# Patient Record
Sex: Female | Born: 1950 | Race: White | Hispanic: No | Marital: Married | Smoking: Never smoker
Health system: Southern US, Community
[De-identification: ages and names within clinical notes are randomized; demographics above are authoritative.]

## PROBLEM LIST (undated history)

## (undated) DIAGNOSIS — Z87442 Personal history of urinary calculi: Secondary | ICD-10-CM

## (undated) DIAGNOSIS — I712 Thoracic aortic aneurysm, without rupture, unspecified: Secondary | ICD-10-CM

## (undated) DIAGNOSIS — Z9889 Other specified postprocedural states: Secondary | ICD-10-CM

## (undated) DIAGNOSIS — C189 Malignant neoplasm of colon, unspecified: Secondary | ICD-10-CM

## (undated) DIAGNOSIS — Z9289 Personal history of other medical treatment: Secondary | ICD-10-CM

## (undated) DIAGNOSIS — I2510 Atherosclerotic heart disease of native coronary artery without angina pectoris: Secondary | ICD-10-CM

## (undated) DIAGNOSIS — R112 Nausea with vomiting, unspecified: Secondary | ICD-10-CM

## (undated) DIAGNOSIS — R011 Cardiac murmur, unspecified: Secondary | ICD-10-CM

## (undated) DIAGNOSIS — M1990 Unspecified osteoarthritis, unspecified site: Secondary | ICD-10-CM

## (undated) DIAGNOSIS — Q231 Congenital insufficiency of aortic valve: Secondary | ICD-10-CM

## (undated) DIAGNOSIS — C7A019 Malignant carcinoid tumor of the small intestine, unspecified portion: Secondary | ICD-10-CM

## (undated) DIAGNOSIS — I719 Aortic aneurysm of unspecified site, without rupture: Secondary | ICD-10-CM

## (undated) DIAGNOSIS — Q2381 Bicuspid aortic valve: Secondary | ICD-10-CM

## (undated) DIAGNOSIS — D509 Iron deficiency anemia, unspecified: Secondary | ICD-10-CM

## (undated) DIAGNOSIS — N200 Calculus of kidney: Secondary | ICD-10-CM

## (undated) DIAGNOSIS — I340 Nonrheumatic mitral (valve) insufficiency: Secondary | ICD-10-CM

## (undated) DIAGNOSIS — I10 Essential (primary) hypertension: Secondary | ICD-10-CM

## (undated) HISTORY — DX: Nonrheumatic mitral (valve) insufficiency: I34.0

## (undated) HISTORY — DX: Personal history of other medical treatment: Z92.89

## (undated) HISTORY — DX: Atherosclerotic heart disease of native coronary artery without angina pectoris: I25.10

## (undated) HISTORY — DX: Thoracic aortic aneurysm, without rupture, unspecified: I71.20

## (undated) HISTORY — DX: Essential (primary) hypertension: I10

## (undated) HISTORY — DX: Congenital insufficiency of aortic valve: Q23.1

## (undated) HISTORY — PX: COLON SURGERY: SHX602

## (undated) HISTORY — DX: Bicuspid aortic valve: Q23.81

## (undated) HISTORY — PX: CATARACT EXTRACTION W/ INTRAOCULAR LENS IMPLANT: SHX1309

## (undated) HISTORY — PX: ABDOMINAL HYSTERECTOMY: SHX81

## (undated) HISTORY — PX: TONSILLECTOMY: SUR1361

## (undated) HISTORY — DX: Thoracic aortic aneurysm, without rupture: I71.2

---

## 1997-09-18 ENCOUNTER — Other Ambulatory Visit: Admission: RE | Admit: 1997-09-18 | Discharge: 1997-09-18 | Payer: Self-pay | Admitting: *Deleted

## 1999-11-27 ENCOUNTER — Other Ambulatory Visit: Admission: RE | Admit: 1999-11-27 | Discharge: 1999-11-27 | Payer: Self-pay | Admitting: *Deleted

## 2003-02-22 ENCOUNTER — Other Ambulatory Visit: Admission: RE | Admit: 2003-02-22 | Discharge: 2003-02-22 | Payer: Self-pay | Admitting: *Deleted

## 2003-08-23 ENCOUNTER — Encounter: Admission: RE | Admit: 2003-08-23 | Discharge: 2003-08-23 | Payer: Self-pay | Admitting: Family Medicine

## 2004-04-01 ENCOUNTER — Encounter: Admission: RE | Admit: 2004-04-01 | Discharge: 2004-04-01 | Payer: Self-pay | Admitting: Family Medicine

## 2004-04-25 ENCOUNTER — Ambulatory Visit (HOSPITAL_COMMUNITY): Admission: RE | Admit: 2004-04-25 | Discharge: 2004-04-25 | Payer: Self-pay | Admitting: Gastroenterology

## 2004-04-25 ENCOUNTER — Encounter (INDEPENDENT_AMBULATORY_CARE_PROVIDER_SITE_OTHER): Payer: Self-pay | Admitting: Specialist

## 2004-06-13 ENCOUNTER — Encounter: Admission: RE | Admit: 2004-06-13 | Discharge: 2004-06-13 | Payer: Self-pay | Admitting: Family Medicine

## 2005-04-22 ENCOUNTER — Encounter: Admission: RE | Admit: 2005-04-22 | Discharge: 2005-07-21 | Payer: Self-pay | Admitting: Family Medicine

## 2005-05-05 ENCOUNTER — Encounter: Admission: RE | Admit: 2005-05-05 | Discharge: 2005-05-05 | Payer: Self-pay | Admitting: *Deleted

## 2005-10-22 ENCOUNTER — Encounter: Admission: RE | Admit: 2005-10-22 | Discharge: 2005-10-22 | Payer: Self-pay | Admitting: *Deleted

## 2006-06-15 ENCOUNTER — Encounter: Admission: RE | Admit: 2006-06-15 | Discharge: 2006-06-15 | Payer: Self-pay | Admitting: Family Medicine

## 2006-11-03 ENCOUNTER — Encounter: Admission: RE | Admit: 2006-11-03 | Discharge: 2006-11-03 | Payer: Self-pay | Admitting: Family Medicine

## 2007-04-05 ENCOUNTER — Encounter: Admission: RE | Admit: 2007-04-05 | Discharge: 2007-04-05 | Payer: Self-pay | Admitting: Cardiology

## 2007-04-07 ENCOUNTER — Ambulatory Visit (HOSPITAL_COMMUNITY): Admission: RE | Admit: 2007-04-07 | Discharge: 2007-04-07 | Payer: Self-pay | Admitting: Cardiology

## 2007-04-11 ENCOUNTER — Encounter: Admission: RE | Admit: 2007-04-11 | Discharge: 2007-04-11 | Payer: Self-pay | Admitting: Cardiology

## 2007-05-06 ENCOUNTER — Encounter: Admission: RE | Admit: 2007-05-06 | Discharge: 2007-05-06 | Payer: Self-pay | Admitting: Gastroenterology

## 2007-05-13 ENCOUNTER — Ambulatory Visit: Payer: Self-pay | Admitting: Cardiothoracic Surgery

## 2007-08-25 ENCOUNTER — Other Ambulatory Visit: Admission: RE | Admit: 2007-08-25 | Discharge: 2007-08-25 | Payer: Self-pay | Admitting: Family Medicine

## 2007-11-11 ENCOUNTER — Encounter: Admission: RE | Admit: 2007-11-11 | Discharge: 2007-11-11 | Payer: Self-pay | Admitting: Family Medicine

## 2007-12-02 ENCOUNTER — Encounter: Admission: RE | Admit: 2007-12-02 | Discharge: 2007-12-02 | Payer: Self-pay | Admitting: Cardiothoracic Surgery

## 2007-12-02 ENCOUNTER — Ambulatory Visit: Payer: Self-pay | Admitting: Cardiothoracic Surgery

## 2008-11-13 ENCOUNTER — Encounter: Admission: RE | Admit: 2008-11-13 | Discharge: 2008-11-13 | Payer: Self-pay | Admitting: Family Medicine

## 2008-11-28 ENCOUNTER — Encounter: Admission: RE | Admit: 2008-11-28 | Discharge: 2008-11-28 | Payer: Self-pay | Admitting: Cardiothoracic Surgery

## 2008-11-30 ENCOUNTER — Ambulatory Visit: Payer: Self-pay | Admitting: Cardiothoracic Surgery

## 2009-11-26 ENCOUNTER — Encounter: Admission: RE | Admit: 2009-11-26 | Discharge: 2009-11-26 | Payer: Self-pay | Admitting: Family Medicine

## 2010-04-30 ENCOUNTER — Other Ambulatory Visit: Payer: Self-pay | Admitting: Cardiothoracic Surgery

## 2010-04-30 DIAGNOSIS — I712 Thoracic aortic aneurysm, without rupture: Secondary | ICD-10-CM

## 2010-06-04 ENCOUNTER — Ambulatory Visit
Admission: RE | Admit: 2010-06-04 | Discharge: 2010-06-04 | Disposition: A | Discharge status: Home / Self Care | Payer: BC Managed Care – PPO | Source: Ambulatory Visit | Attending: Cardiothoracic Surgery | Admitting: Cardiothoracic Surgery

## 2010-06-04 ENCOUNTER — Ambulatory Visit (INDEPENDENT_AMBULATORY_CARE_PROVIDER_SITE_OTHER): Payer: BC Managed Care – PPO | Admitting: Cardiothoracic Surgery

## 2010-06-04 DIAGNOSIS — I712 Thoracic aortic aneurysm, without rupture: Secondary | ICD-10-CM

## 2010-06-04 MED ORDER — GADOBENATE DIMEGLUMINE 529 MG/ML IV SOLN
15.0000 mL | Freq: Once | INTRAVENOUS | Status: AC | PRN
  Administered 2010-06-04: 15 mL via INTRAVENOUS

## 2010-06-05 NOTE — Assessment & Plan Note (Signed)
OFFICE VISIT  Debra, [name] DOB:  April 30, 1950                                        June 04, 2010 CHART #:  [id number]  PROBLEMS: 1. 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 2. Known bicuspid aortic valve by previous echo, done by Dr. Mayford Knife,     without significant aortic stenosis or insufficiency. 3. Mild coronary artery disease by cath in 2009, with history of     atypical chest pain. 4. Hypertension.  HOME MEDICATIONS: 1. Toprol-XL 50 mg once a day. 2. Aspirin 325 mg a day. 3. Simvastatin 20 mg a day.  PRESENT ILLNESS:  The patient is a 60 year old female who returns for further followup of her mild fusiform ascending aneurysm with an MRA of the thoracic aorta.  She has been asymptomatic.  She is still working full-time and exercises with elliptical 3-4 times a week.  She has had no cardiac events or hospital visit since her last review 18 months ago. Her overall health is excellent.  PHYSICAL EXAMINATION:  VITAL SIGNS:  Blood pressure 135/80, pulse 76, saturation on room air 99%. GENERAL:  She is alert and pleasant. CHEST:  Breath sounds are clear. CARDIAC:  Rhythm is regular with a soft 1-2/6 systolic murmur.  There is no diastolic murmur.  Peripheral pulses are all intact.  There is no pedal edema.  LABORATORY DATA:  Her MRA of the ascending aorta shows fusiform aneurysm be unchanged.  By measurements today on MRA rather than CT scan, the ascending aorta diameter is 3.8 cm and descending thoracic aorta is 2.0 cm.  There is no evidence of penetrating ulcer, dissection, or significant calcification.  IMPRESSION AND PLAN:  Stable mild fusiform ascending aortic aneurysm. Continue current medications with blood pressure control but no limitation on activity.  We will plan on seeing her back in 18 months with a followup MRA.  [name], M.D. Electronically Signed  PV/MEDQ  D:   06/04/2010  T:  06/05/2010  Job:  [id number]  cc:   Armanda Magic, M.D. Dario Guardian, M.D.

## 2010-07-22 NOTE — Cardiovascular Report (Signed)
NAMEMARKERIA, GOETSCH           ACCOUNT NO.:  [id number]   MEDICAL RECORD NO.:  [id number]          PATIENT TYPE:  OIB   LOCATION:  2899                         FACILITY:  [facility]   PHYSICIAN:  Armanda Magic, M.D.     DATE OF BIRTH:  [birth year]   DATE OF PROCEDURE:  04/07/2007  DATE OF DISCHARGE:                            CARDIAC CATHETERIZATION   PROCEDURES:  1. Left heart catheterization.  2. Coronary angiography.  3. Left ventriculography.  4. Aortography   OPERATOR:  Armanda Magic, MD   INDICATIONS:  Chest pain.   COMPLICATIONS:  None.   IV MEDICATIONS:  None.   IV ACCESS:  Via right femoral artery 6-French sheath.   This a 60 year old female with a history of ongoing chest pain with no  inducible ischemia by Cardiolite but because of ongoing persistent chest  pain, she now presents first cardiac catheterization.  The patient does  have a history of a bicuspid aortic valve with trivial AI and mildly  dilated aortic root by echo in the past.   The patient was brought to the cardiac catheterization laboratory in a  fasting, nonsedated state.  Informed consent was obtained.  The patient  was connected to continuous heart rate and pulse oximetry monitoring and  intermittent blood pressure monitoring.  The right groin was prepped and  draped in a sterile fashion.  Xylocaine 1% was used for local  anesthesia.  Using modified Seldinger technique, a 6-French sheath was  placed in the right femoral artery.  Under fluoroscopic guidance a 6-  [place] JL-4 catheter was placed in the left coronary artery.  Multiple  cine films were taken in 30-degree RAO and 40-degree LAO views.  The  catheter kept popping out of the ostium so it was exchanged out over a  guidewire for a 6-French JL 3.5 catheter, but this could not  successfully engage the coronary ostium and was exchanged back out for a  6-French JL-4 catheter.  Again multiple cine films were taken in 30-  degree RAO views.  The  catheter was exchanged out over a guidewire for a  6-French JR-4 catheter, which was placed under fluoroscopic guidance in  the right coronary artery.  Multiple cine films taken in 30-degree RAO  and 40-degree LAO views.  This catheter was exchanged out over a  guidewire for a 6-French angled pigtail catheter, which was placed under  fluoroscopic guidance into the left ventricular cavity.  Left  ventriculography was performed in the 30-degree RAO view using a total  of 30 mL of contrast at 15 mL per second.  The catheter was then pulled  back across the aortic valve with no significant gradient noted.  Because of the appearance of a dilated aortic root on the left  ventriculogram, a proximal aortography was performed using a total of 20  mL of contrast at 12 mL per second.  The catheter was then removed over  a guidewire.  At the end of the procedure all catheters and sheaths were  removed.  Manual compression was performed until adequate hemostasis was  obtained.  The patient was transferred back to her room  in stable  condition.   RESULTS:  1. The left main coronary is widely patent and bifurcates into a left      anterior descending artery and left circumflex artery.  2. The left anterior descending artery has an ostial 40-50% narrowing      but then is widely patent throughout its course to the apex.  It      gives rise to two diagonal branches, both of which are widely      patent.  3. The left circumflex is widely patent throughout its course giving      rise to four obtuse marginal branches, which are rather large and      widely patent.  4. The right coronary artery is widely patent throughout its course      across the inferior wall.  It bifurcates into a posterior      descending artery and posterolateral artery, both of which are      widely patent   Left ventriculography showed normal LV function, EF 60%, left  ventricular pressure was 137/-12 mmHg.  Aortic pressure  157/57 mmHg.  LVEDP was 11 mmHg.   Aortography showed an aortic root aneurysm of approximately 4.5 cm.   ASSESSMENT:  1. Nonobstructive coronary disease.  2. Normal left ventricular function.  3. Hypertension.  4. Dilated aortic root.   PLAN:  Discharge to home after IV fluid and bedrest.  Aspirin 325 mg  daily.  Increase Toprol XL to 50 mg a day for better blood pressure  control and follow up with me in 1 week.  I am going to get an  outpatient CT scan of her chest to get a better measurement of the  widest diameter of the aneurysm.      Armanda Magic, M.D.  Electronically Signed     TT/MEDQ  D:  04/07/2007  T:  04/08/2007  Job:  [id number]   cc:   Bryan Lemma. Manus Gunning, M.D.

## 2010-07-22 NOTE — Assessment & Plan Note (Signed)
OFFICE VISIT   Debra [name], Debra [name]  DOB:  08-22-1950                                        November 30, 2008  CHART #:  [id number]   CURRENT PROBLEMS:  1. 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  2. Bicuspid aortic valve with minimal aortic stenosis - aortic      insufficiency by most recent echo in 2009, followed by Dr. Mayford Knife.  3. Mild coronary disease with history of atypical chest pain.  4. Hypertension.   CURRENT MEDICATIONS:  1. Toprol-XL 50 mg daily.  2. Aspirin 325 mg daily.  3. Simvastatin 20 mg daily.   PRESENT ILLNESS:  The patient is a 60 year old nonsmoker who has been  followed with serial scans for a mild-to-moderate fusiform aneurysm of  the ascending aorta.  This was first noted by cardiac cath in January  2009.  I have followed up with 2 CT angiograms prior to this visit.  The  ascending aorta has remained stable, and there has been no evidence of  penetrating ulcer or dissection.  Her aortic valve is bicuspid and has  mild AS/AI and good LV function.  Her coronary arteriography in January  2009, demonstrated a 40% stenosis of the LAD.  She has had atypical  chest pain, felt to be probably GI in origin.  She continues to be very  active and denies any significant chest pain.  There is no palpitation  or history of arrhythmia.  Her father had a bicuspid valve and required  an AVR in his 12s.   PHYSICAL EXAMINATION:  Vital Signs:  Blood pressure 130/80, pulse 75 and  regular, respirations 18, and saturation 99% on room air.  General:  She  is alert and oriented and comfortable.  Lungs:  Clear and equal.  Cardiac:  A regular rhythm with a stable grade 2/6 systolic ejection  murmur and no diastolic rumble.  There is no gallop.  Extremities:  Palpable pulses without edema.   LABORATORY DATA:  An MRI-MRA was performed today in order to reduce the  exposure to radiation with serial imaging studies  of her thoracic aorta.  This demonstrates a stable fusiform ascending aneurysm which measured by  MRA approximately 3.8 cm, slightly smaller, but with a different  technique than the previous scan which is a CTA.   IMPRESSION AND PLAN:  The patient's thoracic aortic disease is mild and  stable.  We spent several minutes discussing her activity levels, and  she does not have any at-risk activities such as heavy lifting or  smoking and her medical risk factors are well controlled with respect to  blood pressure and cholesterol control and weight control.   I told the patient I felt that she was at low risk for dissection and  that we should still continue to follow her thoracic aorta with serial  scans, but we will convert to MRI-MRA studies to reduce cumulative  exposure to radiation as she is very comfortable with the scan.  She  will return in 18 months with a followup study.  She understands that if  the aortic valve disease becomes more significant with an aortic valve  replacement, we would also replace the ascending aorta at that time.   [name], M.D.  Electronically Signed  PV/MEDQ  D:  11/30/2008  T:  12/01/2008  Job:  [id number]   cc:   Armanda Magic, M.D.  Dario Guardian, M.D.

## 2010-07-22 NOTE — Consult Note (Signed)
NEW PATIENT CONSULTATION   Debra Debra [name], Debra Debra [name]  DOB:  May 28, 1950                                        May 13, 2007  CHART #:  [id number]   REASON FOR CONSULTATION:  1. Fusiform diltation of the ascending aorta 4.4 cm.  2. Bicuspid aortic valve with minimal AF-AI.  3. Hypertension.  4. Atypical chest pain.   HISTORY OF PRESENT ILLNESS:  I was asked to evaluate this 60 year old  Caucasian female for Debra [name] recently diagnosed fusiform dilatation of the  ascending aorta, measuring 4.4 cm in diameter by CT angiogram.  The  patient has been followed carefully by Dr. Mayford Knife since 2008 and when  bicuspid aortic valve was diagnosed by 2D echo.  Her LV function is  normal and the transvalvular gradient is less than 10 mmHg, and there is  trace AI on echo.  In 2008 the patient developed some atypical chest  pain and Debra [name] stress test was performed which was negative for inducible  ischemia.  The patient had another episode of atypical chest pain,  possibly felt to be related to esophageal spasm, and underwent Debra [name] GI  evaluation by Dr. Madilyn Fireman, as well as Debra [name] cardiac catheterization by Dr.  Mayford Knife.  The cardiac catheterization showed no significant coronary  disease and the transvalvular gradient by cath was negligible.  LVEDP  was 11 mmHg  and there was fusiform dilatation of the ascending aorta  with Debra [name] normal arch distally.  Debra [name] subsequent CT angiogram confirmed the  dilatation of the ascending aorta with Debra [name] normal arch and descending  thoracic aorta.  There is no evidence of mural thrombus, dissection,  penetrating ulcer, or other pathology.   The patient's atypical chest pain is very vague, not related to  activity, position, exertion, meals, or time of day.  She feels that it  is very minor.  Her GI evaluation by Dr. Madilyn Fireman included an ultrasound of  the abdomen which was negative and she is scheduled to see him back in  the office to possibly discuss endoscopy.  She was offered Debra [name]  proton pump  inhibitor by Dr. Mayford Knife, but declined.   The patients' father had bicuspid aortic valve disease and underwent  aortic valve replacement in his 7th decade.  He subsequently died of  cancer at age 68.   CURRENT MEDICATIONS:  1. Toprol XL 50 mg Debra [name] day.  2. Tylenol p.r.n.  3. Aspirin 325 mg Debra [name] day.   ALLERGIES:  PENICILLIN, SULFA, AND CODEINE.   PAST SURGICAL HISTORY:  Hysterectomy in 1989 without anesthesia  complications.   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  She is married and drinks wine, but does not smoke  cigarettes.   REVIEW OF SYSTEMS:  She has been on antihypertensive therapy for less  than 5 years.  Her cardiac murmur was diagnosed approximately 3 years  ago.  She sees Debra [name] dentist twice Debra [name] year and knows to take antibiotic  prophylaxis.  She denies any difficulty swallowing or symptoms of GERD.  She has no bleeding history or reason not to tolerate long term Coumadin  therapy if needed.  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  She denies  TIA, claudication, DVT, or seizure history.   FAMILY HISTORY:  Positive for diabetes and her hemoglobin Debra [name]-1C has been  in the 6.5 range.  She has not been diagnosed with having diabetes.  She  travels to [place] once Debra [name] year to visit family.   PHYSICAL EXAMINATION:  She is 5 feet 3 inches and weighs 118 pounds.  Blood pressure 140/78.  Pulse 64 and regular, respirations 18.  Saturation 99% on room air.  GENERAL APPEARANCE:  Very pleasant, middle-aged female in no distress.  HEENT:  Normocephalic.  Dentition good.  NECK:  Without JV, mass, or carotid bruit.  LUNGS:  Breath sounds are clear and equal.  There is no thoracic  deformity.  CARDIAC:  With regular rhythm, without S3 gallop and there is Debra [name] grade  3/6 systolic ejection at the  right upper sternal border.  I hear no  murmur of aortic insufficiency.  ABDOMEN:  Soft, nontender, without pulsatile mass.  EXTREMITIES:  Reveal no clubbing, cyanosis, or edema.  Peripheral pulses  are intact.  NEUROLOGIC:  Intact.   LABORATORY DATA:  I reviewed the cardiac cath, echo reports, and her CT  angiogram.  She has Debra [name] bicuspid aortic valve of significant gradient or  insufficiency with Debra [name] mild to moderate dilatation of the ascending aorta  measuring 4 to 4.4 cm in diameter.   IMPRESSION AND PLAN:  I discussed the situation in detail with the  patient and her husband.  She understands that the fusiform dilatation  is at the upper range of normal.  She understands that the normal  threshold for surgery for Debra [name] dilated aorta is 5 cm, in those patients  with Debra [name] bicuspid aortic valve.  Since there is no family history of  aortic dissection, the urgency of aortic repair is lower.   In Debra [name] woman of somewhat short stature, measuring 5 feet 2 inches to 5  feet 3 inches and Debra [name] 4.4 cm aorta, I am comfortable with following this  with another CT angiogram in 6 months.  I would recommend replacement of  her aorta, as well as Debra [name] root replacement, if her aorta starts to enlarge  between 4.5 and 5.0 cm.  I do not feel that the atypical chest pain is  related to the aortic dilatation.  I have encouraged her to remain very  compliant with her antihypertensive medications and to avoid heavy  lifting, and to report any change in her pain symptoms.  I will see her  back in 6 months with Debra [name] CT angiogram.   [name], M.D.  Electronically Signed   PV/MEDQ  D:  05/13/2007  T:  05/13/2007  Job:  [id number]

## 2010-07-22 NOTE — Assessment & Plan Note (Signed)
OFFICE VISIT   Debra [name], Debra [name]  DOB:  [birth year]                                        December 02, 2007  CHART #:  [id number]   CURRENT PROBLEMS:  1. A 4.1-cm fusiform ascending aortic aneurysm.  2. Bicuspid aortic valve with minimal atrial stenosis - atrial      insufficiency.  3. Hypertension.  4. Atypical chest pain.   MEDICATIONS:  Toprol-XL 50 mg daily, aspirin 325 mg daily, and  simvastatin 20 mg daily.   PRESENT ILLNESS:  The patient is a 60 year old nonsmoker who was found  to have a mild ascending fusiform aneurysm of 4.1-4.4 cm when she  underwent cath 6 months ago for atypical chest pain.  Her coronaries  were normal.  In aortic valve, he had no significant gradient or  significant AI.  She was however found to have aneurysmal dilatation of  the ascending aorta, which measured 4.1 cm by CT angiogram.  I evaluated  the patient, recommended medical therapy, and a followup in 6 months.  In the 6 months since her initial evaluation, she has had very rare and  fleeting chest pain.  No symptoms of CHF or dyspnea.  She has had no  change in her medical status.  She followed antibiotic prophylaxis prior  to dental procedures.   PHYSICAL EXAMINATION:  VITAL SIGNS:  Blood pressure 136/77, pulse 74,  respirations 18, and saturation 99%.  GENERAL:  She is alert and comfortable.  LUNGS:  Breath sounds are clear and equal.  Pulses are palpable in all  extremities.  CARDIAC:  Rhythm is regular.  She has a grade 2/6 systolic ejection  murmur at the right upper sternal border.  EXTREMITIES:  Peripheral pulses are intact and there is no pedal edema.   LABORATORY DATA:  Her CT angiogram is reviewed.  There is no change from  her exam 6 months ago.  Her ascending aorta measures 4.1 centimeters in  diameter.  The arch and descending thoracic aorta are normal.   IMPRESSION AND PLAN:  Bicuspid aortic valve without significant stenosis  or  insufficiency with a moderate fusiform aneurysm and ascending aorta  measuring 4.1 cm, stable over the past 6 months.  Continued medical  therapy and serial scanning is the best option at this point.  She knows  to notify us if she has change in her symptoms.  We will plan on a  repeat MRA in 1 year.   [name], M.D.  Electronically Signed   PV/MEDQ  D:  12/02/2007  T:  12/03/2007  Job:  [id number]   cc:   Armanda Magic, M.D.  Dario Guardian, M.D.

## 2010-07-25 NOTE — Op Note (Signed)
[name], Debra [name]           ACCOUNT NO.:  [id number]   MEDICAL RECORD NO.:  [id number]          PATIENT TYPE:  AMB   LOCATION:  ENDO                         FACILITY:  [facility]   PHYSICIAN:  Anselmo Rod, M.D.  DATE OF BIRTH:  [birth year]   DATE OF PROCEDURE:  04/25/2004  DATE OF DISCHARGE:                                 OPERATIVE REPORT   PROCEDURE PERFORMED:  Colonoscopy with cold biopsies x 2.   ENDOSCOPIST:  Anselmo Rod, M.D.   INSTRUMENT USED:  Olympus video colonoscope.   INDICATIONS FOR PROCEDURE:  A 60 year old white female undergoing screening  colonoscopy to rule out colonic polyps, masses, etc.   PRE-PROCEDURE PREPARATION:  Informed consent was procured from the patient.  The patient was fasted for eight hours prior to the procedure and prepped  with a bottle of magnesium citrate and a gallon of GoLYTELY the night prior  to the procedure.  Risks and benefits of the procedure, including a 10% miss  rate of cancer and polyps, were discussed with the patient as well.   PRE-PROCEDURE PHYSICAL:  VITAL SIGNS:  The patient had stable vital signs.  NECK:  Supple.  CHEST:  Clear to auscultation.  CARDIOVASCULAR:  S1, S2 regular.  No murmur present.  ABDOMEN:  Soft, with normal bowel sounds.   DESCRIPTION OF THE PROCEDURE:  The patient was placed in the left lateral  decubitus position, sedated with 60 mg of Demerol and 6 mg of Versed in slow  incremental doses.  She also received 400 mg of Cipro intravenously for MVP  prophylaxis.  Once the patient was adequately sedated and maintained on low-  flow oxygen and continuous cardiac monitoring, the Olympus video colonoscope  was advanced from the rectum to the cecum.  The appendiceal orifice and the  ileocecal valve were clearly visualized and photographed.  A small sessile  polyp was biopsied from the rectosigmoid colon.  The rest of the exam was  unremarkable.  Retroflexion in the rectum revealed no abnormalities.  The  patient tolerated the procedure well, without immediate complications.   IMPRESSION:  Small sessile polyp biopsied from the rectosigmoid colon (cold  biopsies x 2).  Otherwise normal colonoscopy up to the cecum.   RECOMMENDATIONS:  1.  Await pathology results.  2.  Avoid nonsteroidals, including aspirin, for the next two weeks.  3.  Repeat colonoscopy depending on pathology results.  4.  Outpatient followup as the need arises in the future.      JNM/MEDQ  D:  04/25/2004  T:  04/25/2004  Job:  [id number]   cc:   Pershing Cox, M.D.  [address]  [place]  [place] [address]  Fax: [phone]   Bryan Lemma. Manus Gunning, M.D.  [address] [place]  [place] [address]  Fax: [phone]

## 2010-10-27 ENCOUNTER — Other Ambulatory Visit: Payer: Self-pay | Admitting: Family Medicine

## 2010-10-27 DIAGNOSIS — Z1231 Encounter for screening mammogram for malignant neoplasm of breast: Secondary | ICD-10-CM

## 2010-12-01 ENCOUNTER — Ambulatory Visit
Admission: RE | Admit: 2010-12-01 | Discharge: 2010-12-01 | Disposition: A | Discharge status: Home / Self Care | Payer: BC Managed Care – PPO | Source: Ambulatory Visit | Attending: Family Medicine | Admitting: Family Medicine

## 2010-12-01 DIAGNOSIS — Z1231 Encounter for screening mammogram for malignant neoplasm of breast: Secondary | ICD-10-CM

## 2011-10-15 HISTORY — PX: COLONOSCOPY: SHX174

## 2011-10-19 ENCOUNTER — Other Ambulatory Visit: Payer: Self-pay | Admitting: Cardiothoracic Surgery

## 2011-10-19 DIAGNOSIS — I712 Thoracic aortic aneurysm, without rupture: Secondary | ICD-10-CM

## 2011-11-03 ENCOUNTER — Other Ambulatory Visit: Payer: Self-pay | Admitting: Family Medicine

## 2011-11-03 DIAGNOSIS — Z803 Family history of malignant neoplasm of breast: Secondary | ICD-10-CM

## 2011-11-03 DIAGNOSIS — Z1231 Encounter for screening mammogram for malignant neoplasm of breast: Secondary | ICD-10-CM

## 2011-12-01 ENCOUNTER — Ambulatory Visit
Admission: RE | Admit: 2011-12-01 | Discharge: 2011-12-01 | Disposition: A | Discharge status: Home / Self Care | Payer: BC Managed Care – PPO | Source: Ambulatory Visit | Attending: Cardiothoracic Surgery | Admitting: Cardiothoracic Surgery

## 2011-12-01 DIAGNOSIS — I712 Thoracic aortic aneurysm, without rupture: Secondary | ICD-10-CM

## 2011-12-01 MED ORDER — GADOBENATE DIMEGLUMINE 529 MG/ML IV SOLN
11.0000 mL | Freq: Once | INTRAVENOUS | Status: AC | PRN
  Administered 2011-12-01: 11 mL via INTRAVENOUS

## 2011-12-02 ENCOUNTER — Encounter: Payer: Self-pay | Admitting: Cardiothoracic Surgery

## 2011-12-02 ENCOUNTER — Ambulatory Visit (INDEPENDENT_AMBULATORY_CARE_PROVIDER_SITE_OTHER): Payer: BC Managed Care – PPO | Admitting: Cardiothoracic Surgery

## 2011-12-02 VITALS — BP 127/71 | HR 67 | Resp 16 | Ht 62.0 in | Wt 122.0 lb

## 2011-12-02 DIAGNOSIS — Z7189 Other specified counseling: Secondary | ICD-10-CM

## 2011-12-02 DIAGNOSIS — Z712 Person consulting for explanation of examination or test findings: Secondary | ICD-10-CM

## 2011-12-02 DIAGNOSIS — I712 Thoracic aortic aneurysm, without rupture: Secondary | ICD-10-CM

## 2011-12-02 NOTE — Progress Notes (Signed)
PCP is No primary provider on file. Referring Provider is Quintella Reichert, MD  Chief Complaint  Patient presents with  . TAA    18 month f/u with MRA    HPI: 61 year old Caucasian female nonsmoker vegetarian followed since 2009 with a 4.0 fusiform ascending thoracic aneurysm and known bicuspid aortic valve follow with serial 2-D echo by Dr. Mayford Knife showing only mild AS. She is asymptomatic. She is on a beta blocker for blood pressure control. A MRA of the thoracic aorta performed yesterday shows no change in the mild fusiform ascending aneurysm measuring 4.0 cm without evidence of hematoma or penetrating ulcer. The descending thoracic aorta is of normal size.  August 2013 a 2-D echo and Dr. Norris Cross office demonstrated a bicuspid aortic valve without significant aortic stenosis or insufficiency, good LV function   No past medical history on file.  No past surgical history on file.  No family history on file.  Social History History  Substance Use Topics  . Smoking status: Never Smoker   . Smokeless tobacco: Never Used  . Alcohol Use: Yes    No current outpatient prescriptions on file.    Not on File  Review of Systems no fever no weight loss no edema no chest pain or significant dyspnea with exertion  BP 127/71  Pulse 67  Resp 16  Ht 5\' 2"  (1.575 m)  Wt 122 lb (55.339 kg)  BMI 22.31 kg/m2  SpO2 99% Physical Exam Alert and comfortable Lungs clear Cardiac rhythm regular Soft grade 1-2/6 systolic murmur no gallop Diagnostic Tests: MRI MRA of the thoracic aorta shows no change stable fusiform ascending aneurysm 4.0 cm  Impression: Stable since first noted 4 years ago. Continue to follow her with serial MRA next scheduled appointment 18 months  Plan: Return for MRA in 18 months

## 2011-12-09 ENCOUNTER — Ambulatory Visit
Admission: RE | Admit: 2011-12-09 | Discharge: 2011-12-09 | Disposition: A | Discharge status: Home / Self Care | Payer: BC Managed Care – PPO | Source: Ambulatory Visit | Attending: Family Medicine | Admitting: Family Medicine

## 2011-12-09 DIAGNOSIS — Z1231 Encounter for screening mammogram for malignant neoplasm of breast: Secondary | ICD-10-CM

## 2011-12-09 DIAGNOSIS — Z803 Family history of malignant neoplasm of breast: Secondary | ICD-10-CM

## 2012-11-09 ENCOUNTER — Other Ambulatory Visit: Payer: Self-pay

## 2012-11-09 DIAGNOSIS — Z1231 Encounter for screening mammogram for malignant neoplasm of breast: Secondary | ICD-10-CM

## 2012-12-19 ENCOUNTER — Other Ambulatory Visit: Payer: Self-pay | Admitting: Cardiology

## 2012-12-26 ENCOUNTER — Other Ambulatory Visit: Payer: Self-pay | Admitting: *Deleted

## 2012-12-26 DIAGNOSIS — I2510 Atherosclerotic heart disease of native coronary artery without angina pectoris: Secondary | ICD-10-CM

## 2012-12-26 DIAGNOSIS — Z79899 Other long term (current) drug therapy: Secondary | ICD-10-CM

## 2012-12-29 ENCOUNTER — Ambulatory Visit
Admission: RE | Admit: 2012-12-29 | Discharge: 2012-12-29 | Disposition: A | Discharge status: Home / Self Care | Payer: BC Managed Care – PPO | Source: Ambulatory Visit

## 2012-12-29 DIAGNOSIS — Z1231 Encounter for screening mammogram for malignant neoplasm of breast: Secondary | ICD-10-CM

## 2013-01-30 ENCOUNTER — Other Ambulatory Visit: Payer: Self-pay | Admitting: *Deleted

## 2013-01-30 DIAGNOSIS — Z79899 Other long term (current) drug therapy: Secondary | ICD-10-CM

## 2013-01-30 DIAGNOSIS — I2510 Atherosclerotic heart disease of native coronary artery without angina pectoris: Secondary | ICD-10-CM

## 2013-02-20 ENCOUNTER — Other Ambulatory Visit: Payer: BC Managed Care – PPO

## 2013-02-20 ENCOUNTER — Other Ambulatory Visit (INDEPENDENT_AMBULATORY_CARE_PROVIDER_SITE_OTHER): Payer: BC Managed Care – PPO

## 2013-02-20 ENCOUNTER — Other Ambulatory Visit: Payer: Self-pay | Admitting: Cardiology

## 2013-02-20 DIAGNOSIS — I2510 Atherosclerotic heart disease of native coronary artery without angina pectoris: Secondary | ICD-10-CM

## 2013-02-20 DIAGNOSIS — Z79899 Other long term (current) drug therapy: Secondary | ICD-10-CM

## 2013-02-20 LAB — ALT: ALT: 21 U/L (ref 0–35)

## 2013-02-21 LAB — NMR LIPOPROFILE WITH LIPIDS
HDL Particle Number: 42.5 umol/L (ref >=30.5)
HDL Size: 9.7 nm (ref >=9.2)
LDL Size: 20.6 nm (ref >20.5)
Large HDL-P: 13 umol/L (ref >=4.8)
Small LDL Particle Number: 346 nmol/L (ref <=527)

## 2013-02-27 ENCOUNTER — Other Ambulatory Visit: Payer: Self-pay | Admitting: General Surgery

## 2013-02-27 ENCOUNTER — Encounter: Payer: Self-pay | Admitting: General Surgery

## 2013-02-27 DIAGNOSIS — E785 Hyperlipidemia, unspecified: Secondary | ICD-10-CM

## 2013-02-27 NOTE — Progress Notes (Signed)
Letter sent to pt and labs ordered and put on lab schedule for 08/28/13.

## 2013-06-08 ENCOUNTER — Other Ambulatory Visit: Payer: Self-pay

## 2013-06-08 DIAGNOSIS — I712 Thoracic aortic aneurysm, without rupture, unspecified: Secondary | ICD-10-CM

## 2013-07-19 ENCOUNTER — Ambulatory Visit: Payer: BC Managed Care – PPO | Admitting: Cardiothoracic Surgery

## 2013-07-19 ENCOUNTER — Other Ambulatory Visit: Payer: BC Managed Care – PPO

## 2013-07-31 ENCOUNTER — Inpatient Hospital Stay (HOSPITAL_COMMUNITY)
Admission: EM | Admit: 2013-07-31 | Discharge: 2013-08-01 | DRG: 395 | Disposition: A | Discharge status: Home / Self Care | Payer: BC Managed Care – PPO | Attending: Family Medicine | Admitting: Family Medicine

## 2013-07-31 DIAGNOSIS — I712 Thoracic aortic aneurysm, without rupture, unspecified: Secondary | ICD-10-CM | POA: Diagnosis present

## 2013-07-31 DIAGNOSIS — K6389 Other specified diseases of intestine: Secondary | ICD-10-CM

## 2013-07-31 DIAGNOSIS — Z888 Allergy status to other drugs, medicaments and biological substances status: Secondary | ICD-10-CM

## 2013-07-31 DIAGNOSIS — K639 Disease of intestine, unspecified: Principal | ICD-10-CM | POA: Diagnosis present

## 2013-07-31 DIAGNOSIS — Z87442 Personal history of urinary calculi: Secondary | ICD-10-CM

## 2013-07-31 DIAGNOSIS — Z880 Allergy status to penicillin: Secondary | ICD-10-CM

## 2013-07-31 DIAGNOSIS — I7121 Aneurysm of the ascending aorta, without rupture: Secondary | ICD-10-CM

## 2013-07-31 DIAGNOSIS — R109 Unspecified abdominal pain: Secondary | ICD-10-CM | POA: Diagnosis present

## 2013-07-31 DIAGNOSIS — E785 Hyperlipidemia, unspecified: Secondary | ICD-10-CM | POA: Diagnosis present

## 2013-07-31 DIAGNOSIS — Z803 Family history of malignant neoplasm of breast: Secondary | ICD-10-CM

## 2013-07-31 HISTORY — DX: Aortic aneurysm of unspecified site, without rupture: I71.9

## 2013-08-01 ENCOUNTER — Emergency Department (HOSPITAL_COMMUNITY): Payer: BC Managed Care – PPO

## 2013-08-01 ENCOUNTER — Encounter (HOSPITAL_COMMUNITY): Payer: Self-pay | Admitting: Emergency Medicine

## 2013-08-01 DIAGNOSIS — R109 Unspecified abdominal pain: Secondary | ICD-10-CM

## 2013-08-01 DIAGNOSIS — I7121 Aneurysm of the ascending aorta, without rupture: Secondary | ICD-10-CM

## 2013-08-01 DIAGNOSIS — I712 Thoracic aortic aneurysm, without rupture, unspecified: Secondary | ICD-10-CM

## 2013-08-01 DIAGNOSIS — K6389 Other specified diseases of intestine: Secondary | ICD-10-CM

## 2013-08-01 LAB — CBC WITH DIFFERENTIAL/PLATELET
Basophils Absolute: 0 10*3/uL (ref 0.0–0.1)
Basophils Relative: 0 % (ref 0–1)
Eosinophils Absolute: 0.1 10*3/uL (ref 0.0–0.7)
Eosinophils Relative: 1 % (ref 0–5)
HCT: 40.1 % (ref 36.0–46.0)
HEMOGLOBIN: 14.2 g/dL (ref 12.0–15.0)
LYMPHS ABS: 2 10*3/uL (ref 0.7–4.0)
LYMPHS PCT: 14 % (ref 12–46)
MCH: 30.3 pg (ref 26.0–34.0)
MCHC: 35.4 g/dL (ref 30.0–36.0)
MCV: 85.5 fL (ref 78.0–100.0)
MONOS PCT: 8 % (ref 3–12)
Monocytes Absolute: 1.1 10*3/uL — ABNORMAL HIGH (ref 0.1–1.0)
NEUTROS ABS: 11.3 10*3/uL — AB (ref 1.7–7.7)
NEUTROS PCT: 77 % (ref 43–77)
Platelets: 225 10*3/uL (ref 150–400)
RBC: 4.69 MIL/uL (ref 3.87–5.11)
RDW: 14.6 % (ref 11.5–15.5)
WBC: 14.5 10*3/uL — AB (ref 4.0–10.5)

## 2013-08-01 LAB — URINALYSIS, ROUTINE W REFLEX MICROSCOPIC
BILIRUBIN URINE: NEGATIVE
Glucose, UA: NEGATIVE mg/dL
HGB URINE DIPSTICK: NEGATIVE
KETONES UR: NEGATIVE mg/dL
Leukocytes, UA: NEGATIVE
NITRITE: NEGATIVE
PH: 7.5 (ref 5.0–8.0)
Protein, ur: NEGATIVE mg/dL
Specific Gravity, Urine: 1.016 (ref 1.005–1.030)
Urobilinogen, UA: 1 mg/dL (ref 0.0–1.0)

## 2013-08-01 LAB — COMPREHENSIVE METABOLIC PANEL
ALK PHOS: 94 U/L (ref 39–117)
ALT: 12 U/L (ref 0–35)
AST: 19 U/L (ref 0–37)
Albumin: 4 g/dL (ref 3.5–5.2)
BILIRUBIN TOTAL: 0.9 mg/dL (ref 0.3–1.2)
BUN: 13 mg/dL (ref 6–23)
CHLORIDE: 105 meq/L (ref 96–112)
CO2: 27 mEq/L (ref 19–32)
Calcium: 9.4 mg/dL (ref 8.4–10.5)
Creatinine, Ser: 0.63 mg/dL (ref 0.50–1.10)
GFR calc non Af Amer: >90 mL/min (ref >90)
GLUCOSE: 154 mg/dL — AB (ref 70–99)
POTASSIUM: 4 meq/L (ref 3.7–5.3)
Sodium: 145 mEq/L (ref 137–147)
Total Protein: 6.4 g/dL (ref 6.0–8.3)

## 2013-08-01 LAB — I-STAT CG4 LACTIC ACID, ED: Lactic Acid, Venous: 1.39 mmol/L (ref 0.5–2.2)

## 2013-08-01 LAB — LIPASE, BLOOD: LIPASE: 26 U/L (ref 11–59)

## 2013-08-01 MED ORDER — SODIUM CHLORIDE 0.9 % IV SOLN
INTRAVENOUS | Status: DC
  Administered 2013-08-01: 09:00:00 via INTRAVENOUS

## 2013-08-01 MED ORDER — ONDANSETRON HCL 4 MG/2ML IJ SOLN: 4.0000 mg | Freq: Four times a day (QID) | INTRAMUSCULAR | Status: DC | PRN

## 2013-08-01 MED ORDER — KETOROLAC TROMETHAMINE 30 MG/ML IJ SOLN
30.0000 mg | Freq: Once | INTRAMUSCULAR | Status: AC
  Administered 2013-08-01: 30 mg via INTRAVENOUS
  Filled 2013-08-01: qty 1

## 2013-08-01 MED ORDER — ONDANSETRON HCL 4 MG PO TABS: 4.0000 mg | ORAL_TABLET | Freq: Four times a day (QID) | ORAL | Status: DC | PRN

## 2013-08-01 MED ORDER — IOHEXOL 300 MG/ML  SOLN
50.0000 mL | Freq: Once | INTRAMUSCULAR | Status: AC | PRN
  Administered 2013-08-01: 50 mL via ORAL

## 2013-08-01 MED ORDER — ACETAMINOPHEN 650 MG RE SUPP: 650.0000 mg | Freq: Four times a day (QID) | RECTAL | Status: DC | PRN

## 2013-08-01 MED ORDER — ONDANSETRON HCL 4 MG/2ML IJ SOLN
4.0000 mg | Freq: Once | INTRAMUSCULAR | Status: AC
  Administered 2013-08-01: 4 mg via INTRAVENOUS
  Filled 2013-08-01: qty 2

## 2013-08-01 MED ORDER — SIMVASTATIN 20 MG PO TABS
20.0000 mg | ORAL_TABLET | Freq: Every day | ORAL | Status: DC
  Filled 2013-08-01: qty 1

## 2013-08-01 MED ORDER — KETOROLAC TROMETHAMINE 30 MG/ML IJ SOLN
30.0000 mg | Freq: Four times a day (QID) | INTRAMUSCULAR | Status: DC | PRN
Start: 2013-08-01 — End: 2013-08-01
  Filled 2013-08-01: qty 1

## 2013-08-01 MED ORDER — ACETAMINOPHEN 325 MG PO TABS: 650.0000 mg | ORAL_TABLET | Freq: Four times a day (QID) | ORAL | Status: DC | PRN

## 2013-08-01 MED ORDER — IOHEXOL 300 MG/ML  SOLN
100.0000 mL | Freq: Once | INTRAMUSCULAR | Status: AC | PRN
  Administered 2013-08-01: 100 mL via INTRAVENOUS

## 2013-08-01 MED ORDER — ENOXAPARIN SODIUM 40 MG/0.4ML ~~LOC~~ SOLN
40.0000 mg | SUBCUTANEOUS | Status: DC
  Administered 2013-08-01: 40 mg via SUBCUTANEOUS
  Filled 2013-08-01: qty 0.4

## 2013-08-01 MED ORDER — METOPROLOL SUCCINATE ER 50 MG PO TB24
50.0000 mg | ORAL_TABLET | Freq: Every day | ORAL | Status: DC
  Filled 2013-08-01: qty 1

## 2013-08-01 NOTE — Consult Note (Signed)
Reason for Consult: Abnormal CT Referring Physician: Hospital team  Debra [name] is an 63 y.o. female.  HPI: Patient with roughly 6 discrete episodes of abdominal pain which began in the fall and thought was secondary to red wine but they also occurred in February and 3 lately and usually last about 8 hours with occasional nausea and vomiting but no other symptoms and in between these spells she is completely asymptomatic and these spells feel differently than her previous kidney stones and her bowel habits have been normal and we reviewed her meals for the last few attack which were non-significant and she has not had any fever chills night sweats or any weight loss and she denies any family history of GI issues and she denies any other complaints like eye changes joint issues or skin rash and currently she is fine and we had a long talk about her abnormal CAT scan which I reviewed with the radiologist and we discussed the differential as well as possible tests in the future but reassurance was given  Past Medical History  Diagnosis Date  . Kidney stone   . Aortic aneurysm     History reviewed. No pertinent past surgical history.  History reviewed. No pertinent family history.  Social History:  reports that she has never smoked. She has never used smokeless tobacco. She reports that she drinks alcohol. Her drug history is not on file.  Allergies:  Allergies  Allergen Reactions  . Codeine Anaphylaxis  . Penicillins Anaphylaxis    Medications: I have reviewed the patient's current medications.  Results for orders placed during the hospital encounter of 07/31/13 (from the past 48 hour(s))  CBC WITH DIFFERENTIAL     Status: Abnormal   Collection Time    08/01/13  1:20 AM      Result Value Ref Range   WBC 14.5 (*) 4.0 - 10.5 K/uL   RBC 4.69  3.87 - 5.11 MIL/uL   Hemoglobin 14.2  12.0 - 15.0 g/dL   HCT 40.1  36.0 - 46.0 %   MCV 85.5  78.0 - 100.0 fL   MCH 30.3  26.0 - 34.0 pg    MCHC 35.4  30.0 - 36.0 g/dL   RDW 14.6  11.5 - 15.5 %   Platelets 225  150 - 400 K/uL   Neutrophils Relative % 77  43 - 77 %   Neutro Abs 11.3 (*) 1.7 - 7.7 K/uL   Lymphocytes Relative 14  12 - 46 %   Lymphs Abs 2.0  0.7 - 4.0 K/uL   Monocytes Relative 8  3 - 12 %   Monocytes Absolute 1.1 (*) 0.1 - 1.0 K/uL   Eosinophils Relative 1  0 - 5 %   Eosinophils Absolute 0.1  0.0 - 0.7 K/uL   Basophils Relative 0  0 - 1 %   Basophils Absolute 0.0  0.0 - 0.1 K/uL  COMPREHENSIVE METABOLIC PANEL     Status: Abnormal   Collection Time    08/01/13  1:20 AM      Result Value Ref Range   Sodium 145  137 - 147 mEq/L   Potassium 4.0  3.7 - 5.3 mEq/L   Chloride 105  96 - 112 mEq/L   CO2 27  19 - 32 mEq/L   Glucose, Bld 154 (*) 70 - 99 mg/dL   BUN 13  6 - 23 mg/dL   Creatinine, Ser 0.63  0.50 - 1.10 mg/dL   Calcium 9.4  8.4 -  10.5 mg/dL   Total Protein 6.4  6.0 - 8.3 g/dL   Albumin 4.0  3.5 - 5.2 g/dL   AST 19  0 - 37 U/L   ALT 12  0 - 35 U/L   Alkaline Phosphatase 94  39 - 117 U/L   Total Bilirubin 0.9  0.3 - 1.2 mg/dL   GFR calc non Af Amer >90  >90 mL/min   GFR calc Af Amer >90  >90 mL/min   Comment: (NOTE)     The eGFR has been calculated using the CKD EPI equation.     This calculation has not been validated in all clinical situations.     eGFR's persistently <90 mL/min signify possible Chronic Kidney     Disease.  LIPASE, BLOOD     Status: None   Collection Time    08/01/13  1:20 AM      Result Value Ref Range   Lipase 26  11 - 59 U/L  URINALYSIS, ROUTINE W REFLEX MICROSCOPIC     Status: Abnormal   Collection Time    08/01/13  3:27 AM      Result Value Ref Range   Color, Urine YELLOW  YELLOW   APPearance CLOUDY (*) CLEAR   Specific Gravity, Urine 1.016  1.005 - 1.030   pH 7.5  5.0 - 8.0   Glucose, UA NEGATIVE  NEGATIVE mg/dL   Hgb urine dipstick NEGATIVE  NEGATIVE   Bilirubin Urine NEGATIVE  NEGATIVE   Ketones, ur NEGATIVE  NEGATIVE mg/dL   Protein, ur NEGATIVE  NEGATIVE  mg/dL   Urobilinogen, UA 1.0  0.0 - 1.0 mg/dL   Nitrite NEGATIVE  NEGATIVE   Leukocytes, UA NEGATIVE  NEGATIVE   Comment: MICROSCOPIC NOT DONE ON URINES WITH NEGATIVE PROTEIN, BLOOD, LEUKOCYTES, NITRITE, OR GLUCOSE <1000 mg/dL.  I-STAT CG4 LACTIC ACID, ED     Status: None   Collection Time    08/01/13  3:28 AM      Result Value Ref Range   Lactic Acid, Venous 1.39  0.5 - 2.2 mmol/L    Ct Abdomen Pelvis W Contrast  08/01/2013   ADDENDUM REPORT: 08/01/2013 11:20  ADDENDUM: Study reviewed in person with Dr. Watt Climes on 08/01/2013 at 10:55.  The abnormality includes a fairly long segment of small bowel mural thickening involving a loop in the right abdomen extending into the right upper quadrant (coronal images 36- 18).  Given the lack of adenopathy at this time, and small volume of reactive appearing pelvic free fluid, the appearance may reflect an infectious/inflammatory enteritis.  We discussed conservative therapy assuming no new symptoms, and a repeat CT (for example in 8 weeks) to ensure resolution of these findings.   Electronically Signed   By: Lars Pinks M.D.   On: 08/01/2013 11:20   08/01/2013   CLINICAL DATA:  Upper abdominal pain for several months, history kidney stones and thoracic aneurysm, hysterectomy.  EXAM: CT ABDOMEN AND PELVIS WITH CONTRAST  TECHNIQUE: Multidetector CT imaging of the abdomen and pelvis was performed using the standard protocol following bolus administration of intravenous contrast.  CONTRAST:  113m OMNIPAQUE IOHEXOL 300 MG/ML  SOLN  COMPARISON:  [place] 05[place]  FINDINGS: Included view of the lung bases demonstrate lingular atelectasis versus scarring. . The heart appears mildly enlarged, incompletely characterized. Pericardium is unremarkable.  The liver, spleen, and adrenal glands are unremarkable. Calcified nodule at splenic hilum. 5 mm soft tissue density within the gallbladder likely reflects followup, corresponding to known sonographic  abnormality. No superimposed findings of acute cholecystitis. The pancreas is somewhat fatty replaced and otherwise unremarkable.  Small hiatal hernia. Multi focal small bowel wall thickening, narrowing the lumen, most apparent on axial 62/ 80, coronal 18/72. No bowel obstruction. The stomach, and large bowel are normal in course and caliber without inflammatory changes. Enteric contrast has not yet reached the distal small bowel. Normal appendix. Small amount of free fluid in the pelvis. No intraperitoneal free air.  Kidneys are orthotopic, demonstrating symmetric enhancement without nephrolithiasis, hydronephrosis or renal masses. The unopacified ureters are normal in course and caliber. Delayed imaging through the kidneys demonstrates symmetric prompt excretion to the proximal urinary collecting system. Urinary bladder is partially distended and unremarkable.  Aortoiliac vessels are normal in course and caliber. No lymphadenopathy by CT size criteria. Status post hysterectomy. Grade 1 L4-5 anterolisthesis on degenerative basis resulting in moderate to severe L4-5 neural foraminal narrowing.  IMPRESSION: Multifocal small bowel wall thickening and narrowing, without obstruction. Differential diagnosis includes focal lymphoid hyperplasia/inflammatory bowel disease, small bowel tumor, metastasis/lymphoma. Recommend clinical correlation.  Findings discussed with and reconfirmed by KELLY HUMES on5/26/2015at5:30 AM.  Electronically Signed: By: Elon Alas On: 08/01/2013 05:31    ROS negative except above Blood pressure 114/66, pulse 75, temperature 97.7 F (36.5 C), temperature source Oral, resp. rate 18, height 5' 2"  (1.575 m), weight 55.339 kg (122 lb), SpO2 100.00%. Physical Exam vital signs stable afebrile no acute distress lungs clear regular rate and rhythm abdomen is soft nontender good peripheral pulses no pedal edema labs and CT reviewed as above  Assessment/Plan: Episodic spells of abdominal  pain nausea vomiting with abnormal CAT scan Plan: Since she is better I think we can advance her diet and well-tolerated came to home for outpatient workup which will include a discussion with her aneurysm surgeon to make sure these spells or not intestinal ischemia even though her arteries looked good on the last MRI and consider a capsule endoscopy and possible Crohn's serologies and we also discussed small bowel enteroscopy which would be difficult but reassurance was given about lack of worrisome symptoms and possibly just repeating a CAT scan in a month or 2 would be adequate and we also discussed possible adhesions as a cause and surgical options as a last resort and if she goes home she will call me when necessary otherwise followup next week and I will check our office computer as well but no other scans had been done previously  Jeryl Columbia 08/01/2013, 12:00 PM

## 2013-08-01 NOTE — ED Provider Notes (Signed)
CSN: [id number]     Arrival date & time 07/31/13  2256 History   First MD Initiated Contact with Patient 08/01/13 0147     No chief complaint on file.   (Consider location/radiation/quality/duration/timing/severity/associated sxs/prior Treatment) HPI Comments: Patient is a 63 year old female with a hx of ascending aortic aneurysm (followed by [facility] cardiology with unchanged imaging, most recently 1.5 years ago), kidney stones, and hysterectomy (ovaries remain) who presents to the ED for abdominal pain. Patient states that she has been experiencing this abdominal pain intermittently since August 2014. Patient state this most recent episode began at 9PM yesterday evening. Symptoms associated with nausea only, but have been associated with NB/NB emesis in the past. Patient took 1/2 tramadol tablet and zofran with mild relief. Pain has improved from 8/10 to 6/10 since arrival in ED. Pain is nonradiating and diffuse, but feels as though it is primarily in her lower abdomen. She describes the pain as a deep cramping sensation. Patient has been followed by her regular physician for this complaint with no known cause of her symptoms. She denies associated fever, syncope, chest pain, SOB, hematochezia, melena, diarrhea, constipation, dysuria, hematuria, vaginal bleeding, vaginal discharge, numbness/tingling, and weakness. Last colonoscopy ~4 years ago. FHx significant for breast cancer; no personal hx of CA.  The history is provided by the patient. No language interpreter was used.    Past Medical History  Diagnosis Date  . Kidney stone   . Aortic aneurysm    History reviewed. No pertinent past surgical history. History reviewed. No pertinent family history. History  Substance Use Topics  . Smoking status: Never Smoker   . Smokeless tobacco: Never Used  . Alcohol Use: Yes   OB History   Grav Para Term Preterm Abortions TAB SAB Ect Mult Living                 Review of Systems  Constitutional:  Negative for fever.  Respiratory: Negative for shortness of breath.   Cardiovascular: Negative for chest pain.  Gastrointestinal: Positive for nausea, vomiting and abdominal pain.  Genitourinary: Negative for dysuria and hematuria.  All other systems reviewed and are negative.     Allergies  Codeine and Penicillins  Home Medications   Prior to Admission medications   Medication Sig Start Date End Date Taking? Authorizing Provider  aspirin 81 MG tablet Take 81 mg by mouth daily.   Yes Historical Provider, MD  metoprolol succinate (TOPROL-XL) 50 MG 24 hr tablet Take 50 mg by mouth daily. Take with or immediately following a meal.   Yes Historical Provider, MD  ondansetron (ZOFRAN) 8 MG tablet Take 8 mg by mouth every 8 (eight) hours as needed for nausea or vomiting (nausea).   Yes Historical Provider, MD  simvastatin (ZOCOR) 20 MG tablet Take 20 mg by mouth daily.   Yes Historical Provider, MD  traMADol (ULTRAM) 50 MG tablet Take 25 mg by mouth every 6 (six) hours as needed (pain). Takes half a tablet   Yes Historical Provider, MD   BP 147/74  Pulse 72  Temp(Src) 97.6 F (36.4 C) (Oral)  Resp 18  SpO2 100%  Physical Exam  Nursing note and vitals reviewed. Constitutional: She is oriented to person, place, and time. She appears well-developed and well-nourished. No distress.  Nontoxic/nonseptic appearing.  HENT:  Head: Normocephalic and atraumatic.  Mouth/Throat: Oropharynx is clear and moist. No oropharyngeal exudate.  Eyes: Conjunctivae and EOM are normal. No scleral icterus.  Neck: Normal range of motion.  Cardiovascular: Normal  rate, regular rhythm and normal heart sounds.   Pulmonary/Chest: Effort normal and breath sounds normal. No respiratory distress. She has no wheezes. She has no rales.  Abdominal: Soft. She exhibits no distension. There is tenderness (mild diffuse, no focal TTP). There is no rebound and no guarding.  No peritoneal signs. Abdomen soft. No CVA TTP.   Musculoskeletal: Normal range of motion.  Neurological: She is alert and oriented to person, place, and time.  Skin: Skin is warm and dry. No rash noted. She is not diaphoretic. No erythema. No pallor.  Psychiatric: She has a normal mood and affect. Her behavior is normal.    ED Course  Procedures (including critical care time) Labs Review Labs Reviewed  CBC WITH DIFFERENTIAL - Abnormal; Notable for the following:    WBC 14.5 (*)    Neutro Abs 11.3 (*)    Monocytes Absolute 1.1 (*)    All other components within normal limits  COMPREHENSIVE METABOLIC PANEL - Abnormal; Notable for the following:    Glucose, Bld 154 (*)    All other components within normal limits  URINALYSIS, ROUTINE W REFLEX MICROSCOPIC - Abnormal; Notable for the following:    APPearance CLOUDY (*)    All other components within normal limits  LIPASE, BLOOD  I-STAT CG4 LACTIC ACID, ED    Imaging Review Ct Abdomen Pelvis W Contrast  08/01/2013   CLINICAL DATA:  Upper abdominal pain for several months, history kidney stones and thoracic aneurysm, hysterectomy.  EXAM: CT ABDOMEN AND PELVIS WITH CONTRAST  TECHNIQUE: Multidetector CT imaging of the abdomen and pelvis was performed using the standard protocol following bolus administration of intravenous contrast.  CONTRAST:  169mL OMNIPAQUE IOHEXOL 300 MG/ML  SOLN  COMPARISON:  [place] ABDOMEN COMPLETE dated 05/06/2007  FINDINGS: Included view of the lung bases demonstrate lingular atelectasis versus scarring. . The heart appears mildly enlarged, incompletely characterized. Pericardium is unremarkable.  The liver, spleen, and adrenal glands are unremarkable. Calcified nodule at splenic hilum. 5 mm soft tissue density within the gallbladder likely reflects followup, corresponding to known sonographic abnormality. No superimposed findings of acute cholecystitis. The pancreas is somewhat fatty replaced and otherwise unremarkable.  Small hiatal hernia. Multi focal small bowel wall  thickening, narrowing the lumen, most apparent on axial 62/ 80, coronal 18/72. No bowel obstruction. The stomach, and large bowel are normal in course and caliber without inflammatory changes. Enteric contrast has not yet reached the distal small bowel. Normal appendix. Small amount of free fluid in the pelvis. No intraperitoneal free air.  Kidneys are orthotopic, demonstrating symmetric enhancement without nephrolithiasis, hydronephrosis or renal masses. The unopacified ureters are normal in course and caliber. Delayed imaging through the kidneys demonstrates symmetric prompt excretion to the proximal urinary collecting system. Urinary bladder is partially distended and unremarkable.  Aortoiliac vessels are normal in course and caliber. No lymphadenopathy by CT size criteria. Status post hysterectomy. Grade 1 L4-5 anterolisthesis on degenerative basis resulting in moderate to severe L4-5 neural foraminal narrowing.  IMPRESSION: Multifocal small bowel wall thickening and narrowing, without obstruction. Differential diagnosis includes focal lymphoid hyperplasia/inflammatory bowel disease, small bowel tumor, metastasis/lymphoma. Recommend clinical correlation.  Findings discussed with and reconfirmed by [facility] Gardenia Witter on5/26/2015at5:30 AM.   Electronically Signed   By: Elon Alas   On: 08/01/2013 05:31     EKG Interpretation None      MDM   Final diagnoses:  Small bowel mass  Abdominal pain    63 year old female presents for intermittent abdominal pain with most  recent episode beginning yesterday evening. Symptoms associated with nausea and mildly relieved with tramadol. Physical exam significant for mild diffuse abdominal tenderness to palpation without peritoneal signs, masses, or guarding. Patient given Toradol and Zofran for symptoms with significant improvement. Patient has improved from 6/10 to 2/10.   Labs today significant for mild leukocytosis of 14.5. Patient has no anemia or electrolyte  imbalance. Liver and kidney function preserved. Lactate normal. CT abdomen and pelvis ordered for further evaluation of symptoms. Emergent results called to me at 0530. Radiologist conveys a multifocal small bowel wall thickening with nonspecific free fluid in the pelvis. Differential diagnoses for these findings include lymphoma vs metastases vs primary small bowel tumor. The radiologist also mentioned multifocal lymphoid hyperplasia as a differential, but CT findings appear less consistent with this per radiologist interpretation.  Results discussed at length with the patient who verbalizes understanding. Patient to be admitted to the hospital today for further evaluation and workup.   Filed Vitals:   08/01/13 0003 08/01/13 0056 08/01/13 0328  BP: 160/67 145/69 147/74  Pulse: 74 63 72  Temp: 97.6 F (36.4 C)    TempSrc: Oral    Resp:   18  SpO2: 99% 99% 100%      Antonietta Breach, PA-C 08/01/13 [phone]

## 2013-08-01 NOTE — Care Management Note (Signed)
    Page 1 of 1   08/01/2013     11:57:34 AM CARE MANAGEMENT NOTE 08/01/2013  Patient:  Debra [name], Debra [name]   Account Number:  [id number]  Date Initiated:  08/01/2013  Documentation initiated by:  Sunday Spillers  Subjective/Objective Assessment:   63 yo female admitted with abd pain. PTA lived at home with spouse.     Action/Plan:   Home when stable   Anticipated DC Date:  08/04/2013   Anticipated DC Plan:  [place]  CM consult      Choice offered to / List presented to:             Status of service:  Completed, signed off Medicare Important Message given?   (If response is "NO", the following Medicare IM given date fields will be blank) Date Medicare IM given:   Date Additional Medicare IM given:    Discharge Disposition:  HOME/SELF CARE  Per UR Regulation:  Reviewed for med. necessity/level of care/duration of stay  If discussed at [place] of Stay Meetings, dates discussed:    Comments:

## 2013-08-01 NOTE — Discharge Summary (Addendum)
Physician Discharge Summary  Debra [name] NFA:[id number] DOB: Jan 15, 1951 DOA: 07/31/2013  PCP: No primary provider on file.  Admit date: 07/31/2013 Discharge date: 08/01/2013  Time spent: 25* minutes  Recommendations for Outpatient Follow-up:  1. Follow up GI Dr Watt Climes in one week  Discharge Diagnoses:  Principal Problem:   Abdominal pain Active Problems:   Small bowel mass   Ascending aortic aneurysm   Discharge Condition: Stable  Diet recommendation: Regular diet  Filed Weights   08/01/13 0845  Weight: 55.339 kg (122 lb)    History of present illness:  63 year old female who has a past medical history of Kidney stone and Aortic aneurysm. today came to the ED with chief complaint of abdominal pain, which started last night around 8 PM. Patient says that she has had multiple episodes of abdominal pain over the past 6 months, and she has been going to her primary care provider. Had been taking the abdominal pain symptomatically, she was also diagnosed UTI at one point, which was thought to be the cause of abdominal pain. She did not have nausea and vomiting, denies diarrhea no bloody bowel movements. The abdominal pain is located in the mid abdominal region, does not radiate anywhere, 8/10 in intensity. She denies fever, no chest pain shortness of breath, no dysuria.  Patient took have tramadol tablet and Zofran with mild relief. The pain is described as deep cramping sensation.  Patient has a history of hysterectomy 20 years ago for benign tumor. She has a history of ascending aortic aneurysm and is followed by cardiology, patient is on metoprolol for embolism. Patient's last colonoscopy was 4 years ago which did not show any significant masses.  In the ED CT scan abdomen was done, which showed intermittent thickening in the small bowel with broad differential including lymphoid hyperplasia versus metastasis versus inflammatory bowel disease.  Patient denies pain at this time, she  was given Toradol In the ED.   Hospital Course:  Abdominal pain/small bowel thickening  Patient was admitted for , CT abdomen and pelvis shows thickening of small bowel, with a broad differential diagnosis including focal lymphoid hyperplasia, intermittent old disease, small bowel tumor, metastasis, lymphoma.h GI Dr. Watt Climes,  Saw the patient and has reviewed the films with radiologist, and recommends to discharge home and follow up with him in one week. The lesions appear more infectious/inflammatory. ? Ischemia. GI to follow up. Will continue to take Tramadol prn for pain.   ? Gallstone versus mass  CT also shows 5 mm soft tissue density within the gallbladder,abdominal ultrasound was ordered, not done yet. Patient to be discharged home, and will need ultrasound as outpatient.Explained to the patient.  Ascending aortic aneurysm  Patient has history of ascending aortic aneurysm and has been taking metoprolol 50 mg by mouth daily. Has been followed by cardiology as outpatient. No recent growth in the aneurysm as per patient.   Hyperlipidemia  Continue Zocor  Leukocytosis ? Reactive, she has been afebrile in the hospital. UA is negative.  Procedures:  None  Consultations:  GI  Discharge Exam: Filed Vitals:   08/01/13 0845  BP: 114/66  Pulse: 75  Temp: 97.7 F (36.5 C)  Resp: 18    General: Appear in no acute distress Cardiovascular: *s1s2 RRR Respiratory: Clear bilaterally  Discharge Instructions You were cared for by a hospitalist during your hospital stay. If you have any questions about your discharge medications or the care you received while you were in the hospital after you are discharged,  you can call the unit and asked to speak with the hospitalist on call if the hospitalist that took care of you is not available. Once you are discharged, your primary care physician will handle any further medical issues. Please note that NO REFILLS for any discharge medications will  be authorized once you are discharged, as it is imperative that you return to your primary care physician (or establish a relationship with a primary care physician if you do not have one) for your aftercare needs so that they can reassess your need for medications and monitor your lab values.  Discharge Instructions   Diet - low sodium heart healthy    Complete by:  As directed      Increase activity slowly    Complete by:  As directed             Medication List         aspirin 81 MG tablet  Take 81 mg by mouth daily.     metoprolol succinate 50 MG 24 hr tablet  Commonly known as:  TOPROL-XL  Take 50 mg by mouth daily. Take with or immediately following a meal.     ondansetron 8 MG tablet  Commonly known as:  ZOFRAN  Take 8 mg by mouth every 8 (eight) hours as needed for nausea or vomiting (nausea).     simvastatin 20 MG tablet  Commonly known as:  ZOCOR  Take 20 mg by mouth daily.     traMADol 50 MG tablet  Commonly known as:  ULTRAM  Take 25 mg by mouth every 6 (six) hours as needed (pain). Takes half a tablet       Allergies  Allergen Reactions  . Codeine Anaphylaxis  . Penicillins Anaphylaxis       Follow-up Information   Follow up with [facility], MD In 1 week.   Specialty:  Gastroenterology   Contact information:   [address] [place] [address] [phone]        The results of significant diagnostics from this hospitalization (including imaging, microbiology, ancillary and laboratory) are listed below for reference.    Significant Diagnostic Studies: Ct Abdomen Pelvis W Contrast  08/01/2013   ADDENDUM REPORT: 08/01/2013 11:20  ADDENDUM: Study reviewed in person with Dr. Watt Climes on 08/01/2013 at 10:55.  The abnormality includes a fairly long segment of small bowel mural thickening involving a loop in the right abdomen extending into the right upper quadrant (coronal images 36- 18).  Given the lack of adenopathy at this time, and  small volume of reactive appearing pelvic free fluid, the appearance may reflect an infectious/inflammatory enteritis.  We discussed conservative therapy assuming no new symptoms, and a repeat CT (for example in 8 weeks) to ensure resolution of these findings.   Electronically Signed   By: Lars Pinks M.D.   On: 08/01/2013 11:20   08/01/2013   CLINICAL DATA:  Upper abdominal pain for several months, history kidney stones and thoracic aneurysm, hysterectomy.  EXAM: CT ABDOMEN AND PELVIS WITH CONTRAST  TECHNIQUE: Multidetector CT imaging of the abdomen and pelvis was performed using the standard protocol following bolus administration of intravenous contrast.  CONTRAST:  156mL OMNIPAQUE IOHEXOL 300 MG/ML  SOLN  COMPARISON:  US ABDOMEN COMPLETE dated 05/06/2007  FINDINGS: Included view of the lung bases demonstrate lingular atelectasis versus scarring. . The heart appears mildly enlarged, incompletely characterized. Pericardium is unremarkable.  The liver, spleen, and adrenal glands are unremarkable. Calcified nodule at  splenic hilum. 5 mm soft tissue density within the gallbladder likely reflects followup, corresponding to known sonographic abnormality. No superimposed findings of acute cholecystitis. The pancreas is somewhat fatty replaced and otherwise unremarkable.  Small hiatal hernia. Multi focal small bowel wall thickening, narrowing the lumen, most apparent on axial 62/ 80, coronal 18/72. No bowel obstruction. The stomach, and large bowel are normal in course and caliber without inflammatory changes. Enteric contrast has not yet reached the distal small bowel. Normal appendix. Small amount of free fluid in the pelvis. No intraperitoneal free air.  Kidneys are orthotopic, demonstrating symmetric enhancement without nephrolithiasis, hydronephrosis or renal masses. The unopacified ureters are normal in course and caliber. Delayed imaging through the kidneys demonstrates symmetric prompt excretion to the proximal  urinary collecting system. Urinary bladder is partially distended and unremarkable.  Aortoiliac vessels are normal in course and caliber. No lymphadenopathy by CT size criteria. Status post hysterectomy. Grade 1 L4-5 anterolisthesis on degenerative basis resulting in moderate to severe L4-5 neural foraminal narrowing.  IMPRESSION: Multifocal small bowel wall thickening and narrowing, without obstruction. Differential diagnosis includes focal lymphoid hyperplasia/inflammatory bowel disease, small bowel tumor, metastasis/lymphoma. Recommend clinical correlation.  Findings discussed with and reconfirmed by KELLY HUMES on5/26/2015at5:30 AM.  Electronically Signed: By: Elon Alas On: 08/01/2013 05:31    Microbiology: No results found for this or any previous visit (from the past 240 hour(s)).   Labs: Basic Metabolic Panel:  Recent Labs Lab 08/01/13 0120  NA 145  K 4.0  CL 105  CO2 27  GLUCOSE 154*  BUN 13  CREATININE 0.63  CALCIUM 9.4   Liver Function Tests:  Recent Labs Lab 08/01/13 0120  AST 19  ALT 12  ALKPHOS 94  BILITOT 0.9  PROT 6.4  ALBUMIN 4.0    Recent Labs Lab 08/01/13 0120  LIPASE 26   No results found for this basename: AMMONIA,  in the last 168 hours CBC:  Recent Labs Lab 08/01/13 0120  WBC 14.5*  NEUTROABS 11.3*  HGB 14.2  HCT 40.1  MCV 85.5  PLT 225   Cardiac Enzymes: No results found for this basename: CKTOTAL, CKMB, CKMBINDEX, TROPONINI,  in the last 168 hours BNP: BNP (last 3 results) No results found for this basename: PROBNP,  in the last 8760 hours CBG: No results found for this basename: GLUCAP,  in the last 168 hours     Signed:  [facility] 08/01/2013, 1:00 PM

## 2013-08-01 NOTE — ED Notes (Signed)
Patient is alert and oriented x3.  She is complaining of Upper abdominal pain since last august.   Patient has been seen multiple times for this issue with no resolve. She was seen by MD on Thursday with no relief.  Patient has taken zofran and  Ultram before coming tonight with no relief.  Currently she rates her pain 8 of 10 with nausea.

## 2013-08-01 NOTE — Discharge Instructions (Signed)
Abdominal ultrasound as outpatient, to check on 5 mm soft tissue density in the gall bladder

## 2013-08-01 NOTE — H&P (Signed)
PCP:   No primary provider on file.   Chief Complaint:  Abdominal pain  HPI: 63 year old female who   has a past medical history of Kidney stone and Aortic aneurysm. today came to the ED with chief complaint of abdominal pain, which started last night around 8 PM. Patient says that she has had multiple episodes of abdominal pain over the past 6 months, and she has been going to her primary care provider. Had been taking the abdominal pain symptomatically, she was also diagnosed UTI at one point, which was thought to be the cause of abdominal pain. She did not have nausea and vomiting, denies diarrhea no bloody bowel movements. The abdominal pain is located in the mid abdominal region, does not radiate anywhere, 8/10 in intensity. She denies fever, no chest pain shortness of breath, no dysuria. Patient took have tramadol tablet and Zofran with mild relief. The pain is described as deep cramping sensation. Patient has a history of hysterectomy 20 years ago for benign tumor. She has a history of ascending aortic aneurysm and is followed by cardiology, patient is on metoprolol for embolism. Patient's last colonoscopy was 4 years ago which did not show any significant masses. In the ED CT scan abdomen was done, which showed intermittent thickening in the small bowel with broad differential including lymphoid hyperplasia versus metastasis versus inflammatory bowel disease. Patient denies pain at this time, she was given Toradol In the ED.  Allergies:   Allergies  Allergen Reactions  . Codeine Anaphylaxis  . Penicillins Anaphylaxis      Past Medical History  Diagnosis Date  . Kidney stone   . Aortic aneurysm     History reviewed. No pertinent past surgical history.  Prior to Admission medications   Medication Sig Start Date End Date Taking? Authorizing Provider  aspirin 81 MG tablet Take 81 mg by mouth daily.   Yes Historical Provider, MD  metoprolol succinate (TOPROL-XL) 50 MG 24 hr  tablet Take 50 mg by mouth daily. Take with or immediately following a meal.   Yes Historical Provider, MD  ondansetron (ZOFRAN) 8 MG tablet Take 8 mg by mouth every 8 (eight) hours as needed for nausea or vomiting (nausea).   Yes Historical Provider, MD  simvastatin (ZOCOR) 20 MG tablet Take 20 mg by mouth daily.   Yes Historical Provider, MD  traMADol (ULTRAM) 50 MG tablet Take 25 mg by mouth every 6 (six) hours as needed (pain). Takes half a tablet   Yes Historical Provider, MD    Social History:  reports that she has never smoked. She has never used smokeless tobacco. She reports that she drinks alcohol. Her drug history is not on file.  Family history Breast cancer in mother in her 25s   All the positives are listed in BOLD  Review of Systems:  HEENT: Headache, blurred vision, runny nose, sore throat Neck: Hypothyroidism, hyperthyroidism,,lymphadenopathy Chest : Shortness of breath, history of COPD, Asthma Heart : Chest pain, history of coronary arterey disease GI:  Nausea, vomiting, diarrhea, constipation, GERD GU: Dysuria, urgency, frequency of urination, hematuria Neuro: Stroke, seizures, syncope Psych: Depression, anxiety, hallucinations   Physical Exam: Blood pressure 147/74, pulse 72, temperature 97.6 F (36.4 C), temperature source Oral, resp. rate 18, SpO2 100.00%. Constitutional:   Patient is a well-developed and well-nourished female in no acute distress and cooperative with exam. Head: Normocephalic and atraumatic Mouth: Mucus membranes moist Eyes: PERRL, EOMI, conjunctivae normal Neck: Supple, No Thyromegaly Cardiovascular: RRR, grade 3 / 6  holosystolic murmur auscultated at the mitral and aortic area Pulmonary/Chest: CTAB, no wheezes, rales, or rhonchi Abdominal: Soft. Non-tender, non-distended, bowel sounds are normal, no masses, organomegaly, or guarding present.  Neurological: A&O x3, Strenght is normal and symmetric bilaterally, cranial nerve II-XII are  grossly intact, no focal motor deficit, sensory intact to light touch bilaterally.  Extremities : No Cyanosis, Clubbing or Edema  Labs on Admission:  Basic Metabolic Panel:  Recent Labs Lab 08/01/13 0120  NA 145  K 4.0  CL 105  CO2 27  GLUCOSE 154*  BUN 13  CREATININE 0.63  CALCIUM 9.4   Liver Function Tests:  Recent Labs Lab 08/01/13 0120  AST 19  ALT 12  ALKPHOS 94  BILITOT 0.9  PROT 6.4  ALBUMIN 4.0    Recent Labs Lab 08/01/13 0120  LIPASE 26   No results found for this basename: AMMONIA,  in the last 168 hours CBC:  Recent Labs Lab 08/01/13 0120  WBC 14.5*  NEUTROABS 11.3*  HGB 14.2  HCT 40.1  MCV 85.5  PLT 225     Radiological Exams on Admission: Ct Abdomen Pelvis W Contrast  08/01/2013   CLINICAL DATA:  Upper abdominal pain for several months, history kidney stones and thoracic aneurysm, hysterectomy.  EXAM: CT ABDOMEN AND PELVIS WITH CONTRAST  TECHNIQUE: Multidetector CT imaging of the abdomen and pelvis was performed using the standard protocol following bolus administration of intravenous contrast.  CONTRAST:  140mL OMNIPAQUE IOHEXOL 300 MG/ML  SOLN  COMPARISON:  US ABDOMEN COMPLETE dated 05/06/2007  FINDINGS: Included view of the lung bases demonstrate lingular atelectasis versus scarring. . The heart appears mildly enlarged, incompletely characterized. Pericardium is unremarkable.  The liver, spleen, and adrenal glands are unremarkable. Calcified nodule at splenic hilum. 5 mm soft tissue density within the gallbladder likely reflects followup, corresponding to known sonographic abnormality. No superimposed findings of acute cholecystitis. The pancreas is somewhat fatty replaced and otherwise unremarkable.  Small hiatal hernia. Multi focal small bowel wall thickening, narrowing the lumen, most apparent on axial 62/ 80, coronal 18/72. No bowel obstruction. The stomach, and large bowel are normal in course and caliber without inflammatory changes. Enteric  contrast has not yet reached the distal small bowel. Normal appendix. Small amount of free fluid in the pelvis. No intraperitoneal free air.  Kidneys are orthotopic, demonstrating symmetric enhancement without nephrolithiasis, hydronephrosis or renal masses. The unopacified ureters are normal in course and caliber. Delayed imaging through the kidneys demonstrates symmetric prompt excretion to the proximal urinary collecting system. Urinary bladder is partially distended and unremarkable.  Aortoiliac vessels are normal in course and caliber. No lymphadenopathy by CT size criteria. Status post hysterectomy. Grade 1 L4-5 anterolisthesis on degenerative basis resulting in moderate to severe L4-5 neural foraminal narrowing.  IMPRESSION: Multifocal small bowel wall thickening and narrowing, without obstruction. Differential diagnosis includes focal lymphoid hyperplasia/inflammatory bowel disease, small bowel tumor, metastasis/lymphoma. Recommend clinical correlation.  Findings discussed with and reconfirmed by [facility] HUMES on5/26/2015at5:30 AM.   Electronically Signed   By: Elon Alas   On: 08/01/2013 05:31      Assessment/Plan Principal Problem:   Abdominal pain Active Problems:   Small bowel mass   Ascending aortic aneurysm  Abdominal pain/small bowel thickening ? Cause, CT abdomen and pelvis shows thickening of small bowel, with a broad differential diagnosis including focal lymphoid hyperplasia, intermittent old disease, small bowel tumor, metastasis, lymphoma. I will keep the patient n.p.o., Give Toradol when necessary for pain. Called and discussed with GI  Dr. Watt Climes, who will see the patient today and make further recommendations including further workup.  ? Gallstone versus mass CT also shows 5 mm soft tissue density within the gallbladder, I will obtain abdominal ultrasound. Will follow the results  Ascending aortic aneurysm Patient has history of ascending aortic aneurysm and has been  taking metoprolol 50 mg by mouth daily. Has been followed by cardiology as outpatient. No recent growth in the aneurysm as per patient.  Hyperlipidemia Continue Zocor  DVT prophylaxis Lovenox  Code status: Patient is full code  Family discussion: Admission, patients condition and plan of care including tests being ordered have been discussed with the patient and *her husband at bedside* who indicate understanding and agree with the plan and Code Status.   Time Spent on Admission: 70 minutes  [facility] Pager: [phone] 08/01/2013, 8:22 AM  If 7PM-7AM, please contact night-coverage  [email or web address]  Password [email or web address]

## 2013-08-01 NOTE — Progress Notes (Signed)
Rn reviewed discharge instructions with patient and husband. All questions answered.   Patient stated understanding that no medications were changed, and to follow up with the Gastro MD in 1 week at his office.   Wheelchair transportation offered, patient denied. Patient ambulated to private car with husband.

## 2013-08-02 ENCOUNTER — Inpatient Hospital Stay: Admission: RE | Admit: 2013-08-02 | Payer: BC Managed Care – PPO | Source: Ambulatory Visit

## 2013-08-02 ENCOUNTER — Other Ambulatory Visit: Payer: Self-pay | Admitting: *Deleted

## 2013-08-02 ENCOUNTER — Ambulatory Visit: Payer: BC Managed Care – PPO | Admitting: Cardiothoracic Surgery

## 2013-08-02 DIAGNOSIS — I712 Thoracic aortic aneurysm, without rupture, unspecified: Secondary | ICD-10-CM

## 2013-08-02 NOTE — ED Provider Notes (Signed)
Medical screening examination/treatment/procedure(s) were performed by non-physician practitioner and as supervising physician I was immediately available for consultation/collaboration.   EKG Interpretation None       Varney Biles, MD 08/02/13 [phone]

## 2013-08-03 ENCOUNTER — Other Ambulatory Visit: Payer: Self-pay | Admitting: Family Medicine

## 2013-08-03 DIAGNOSIS — R1083 Colic: Secondary | ICD-10-CM

## 2013-08-07 ENCOUNTER — Other Ambulatory Visit: Payer: BC Managed Care – PPO

## 2013-08-07 ENCOUNTER — Ambulatory Visit
Admission: RE | Admit: 2013-08-07 | Discharge: 2013-08-07 | Disposition: A | Discharge status: Home / Self Care | Payer: BC Managed Care – PPO | Source: Ambulatory Visit | Attending: Cardiothoracic Surgery | Admitting: Cardiothoracic Surgery

## 2013-08-07 DIAGNOSIS — I712 Thoracic aortic aneurysm, without rupture, unspecified: Secondary | ICD-10-CM

## 2013-08-07 MED ORDER — IOHEXOL 300 MG/ML  SOLN: 75.0000 mL | Freq: Once | INTRAMUSCULAR | Status: DC | PRN

## 2013-08-07 MED ORDER — IOHEXOL 350 MG/ML SOLN
80.0000 mL | Freq: Once | INTRAVENOUS | Status: AC | PRN
  Administered 2013-08-07: 80 mL via INTRAVENOUS

## 2013-08-09 ENCOUNTER — Ambulatory Visit: Payer: BC Managed Care – PPO | Admitting: Cardiothoracic Surgery

## 2013-08-11 ENCOUNTER — Ambulatory Visit: Payer: BC Managed Care – PPO | Admitting: Cardiothoracic Surgery

## 2013-08-16 ENCOUNTER — Ambulatory Visit (INDEPENDENT_AMBULATORY_CARE_PROVIDER_SITE_OTHER): Payer: BC Managed Care – PPO | Admitting: Cardiothoracic Surgery

## 2013-08-16 ENCOUNTER — Encounter: Payer: Self-pay | Admitting: Cardiothoracic Surgery

## 2013-08-16 VITALS — BP 144/84 | HR 75 | Resp 20 | Ht 62.0 in | Wt 122.0 lb

## 2013-08-16 DIAGNOSIS — I712 Thoracic aortic aneurysm, without rupture, unspecified: Secondary | ICD-10-CM

## 2013-08-17 NOTE — Progress Notes (Signed)
PCP is No primary provider on file. Referring Provider is Reginia Naas, *  Chief Complaint  Patient presents with  . Follow-up    18 month f/u with [facility] Chest    HPI: The patient returns for a scheduled followup of a chronic 4 cm Fusiform ascending aneurysm, asymptomatic. She continues to manage her risk factors including blood pressure control, weight control, and statin therapy. She's had no chest pain or neurologic symptoms. The patient mainly complains of intermittent severe lower abdominal pain. She expresses concern that the abdominal pain may be secondary to thromboemboli from her thoracic ascending fusiform aneurysm. However the fusiform aneurysm is very smooth walled without mural thickening or plaque it would be very unlikely to be the source of thromboembolic phenomenon downstream. A previous abdominal CT scan apparently demonstrated some thickening of the small bowel in the region of her pain. The patient had a previous abdominal hysterectomy and has been evaluated by her gastroenterologist, Dr. Watt Climes.  Past Medical History  Diagnosis Date  . Kidney stone   . Aortic aneurysm     No past surgical history on file.  No family history on file.  Social History History  Substance Use Topics  . Smoking status: Never Smoker   . Smokeless tobacco: Never Used  . Alcohol Use: Yes    Current Outpatient Prescriptions  Medication Sig Dispense Refill  . aspirin 81 MG tablet Take 81 mg by mouth daily.      . metoprolol succinate (TOPROL-XL) 50 MG 24 hr tablet Take 50 mg by mouth daily. Take with or immediately following a meal.      . ondansetron (ZOFRAN) 8 MG tablet Take 8 mg by mouth every 8 (eight) hours as needed for nausea or vomiting (nausea).      . simvastatin (ZOCOR) 20 MG tablet Take 20 mg by mouth daily.      . traMADol (ULTRAM) 50 MG tablet Take 25 mg by mouth every 6 (six) hours as needed (pain). Takes half a tablet       No current facility-administered  medications for this visit.    Allergies  Allergen Reactions  . Codeine Anaphylaxis  . Penicillins Anaphylaxis    Review of Systems no changes in her health other than her intermittent acute abdominal pain. She actually required hospitalization at [facility] in [place]  The patient would like. to continue to travel with her husband but is concerned regarding medical care out of country.  BP 144/84  Pulse 75  Resp 20  Ht 5\' 2"  (1.575 m)  Wt 122 lb (55.339 kg)  BMI 22.31 kg/m2  SpO2 99% Physical Exam alert and comfortable   HEENT normocephalic Good peripheral pulses in the neck and extremities Breath sounds clear Heart rate regular without murmur Abdomen soft without murmur  Diagnostic Tests:  CTA of the thoracic aorta shows the ascending aorta to remain at 4 cm in diameter, smooth intimal lining without mural thickening or plaque buildup, no mural hematoma. Descending thoracic aorta remains normal.  Impression  Stable fusiform aneurysm of the ascending aorta-- continue surveillance with scans, return in 18 months

## 2013-08-28 ENCOUNTER — Other Ambulatory Visit: Payer: BC Managed Care – PPO

## 2013-09-06 ENCOUNTER — Ambulatory Visit: Payer: BC Managed Care – PPO | Admitting: Cardiothoracic Surgery

## 2013-09-06 ENCOUNTER — Other Ambulatory Visit: Payer: Self-pay | Admitting: Gastroenterology

## 2013-09-06 ENCOUNTER — Other Ambulatory Visit: Payer: BC Managed Care – PPO

## 2013-09-06 DIAGNOSIS — R109 Unspecified abdominal pain: Secondary | ICD-10-CM

## 2013-09-25 ENCOUNTER — Encounter: Payer: Self-pay | Admitting: General Surgery

## 2013-09-25 DIAGNOSIS — I10 Essential (primary) hypertension: Secondary | ICD-10-CM | POA: Insufficient documentation

## 2013-09-25 DIAGNOSIS — I359 Nonrheumatic aortic valve disorder, unspecified: Secondary | ICD-10-CM

## 2013-09-25 DIAGNOSIS — I2510 Atherosclerotic heart disease of native coronary artery without angina pectoris: Secondary | ICD-10-CM | POA: Insufficient documentation

## 2013-09-25 DIAGNOSIS — I712 Thoracic aortic aneurysm, without rupture, unspecified: Secondary | ICD-10-CM

## 2013-09-25 DIAGNOSIS — E7800 Pure hypercholesterolemia, unspecified: Secondary | ICD-10-CM | POA: Insufficient documentation

## 2013-09-27 ENCOUNTER — Ambulatory Visit
Admission: RE | Admit: 2013-09-27 | Discharge: 2013-09-27 | Disposition: A | Discharge status: Home / Self Care | Payer: BC Managed Care – PPO | Source: Ambulatory Visit | Attending: Gastroenterology | Admitting: Gastroenterology

## 2013-09-27 DIAGNOSIS — R109 Unspecified abdominal pain: Secondary | ICD-10-CM

## 2013-09-27 MED ORDER — IOHEXOL 300 MG/ML  SOLN
100.0000 mL | Freq: Once | INTRAMUSCULAR | Status: AC | PRN
  Administered 2013-09-27: 100 mL via INTRAVENOUS

## 2013-10-12 ENCOUNTER — Ambulatory Visit: Payer: BC Managed Care – PPO | Admitting: Cardiology

## 2013-10-16 ENCOUNTER — Other Ambulatory Visit (INDEPENDENT_AMBULATORY_CARE_PROVIDER_SITE_OTHER): Payer: BC Managed Care – PPO

## 2013-10-16 DIAGNOSIS — E785 Hyperlipidemia, unspecified: Secondary | ICD-10-CM

## 2013-10-16 LAB — HEPATIC FUNCTION PANEL
ALBUMIN: 4 g/dL (ref 3.5–5.2)
ALT: 12 U/L (ref 0–35)
AST: 20 U/L (ref 0–37)
Alkaline Phosphatase: 91 U/L (ref 39–117)
Bilirubin, Direct: 0.3 mg/dL (ref 0.0–0.3)
Total Bilirubin: 2.1 mg/dL — ABNORMAL HIGH (ref 0.2–1.2)
Total Protein: 6.5 g/dL (ref 6.0–8.3)

## 2013-10-17 LAB — NMR LIPOPROFILE WITH LIPIDS
Cholesterol, Total: 166 mg/dL (ref <200)
HDL PARTICLE NUMBER: 35.7 umol/L (ref >=30.5)
HDL Size: 9.8 nm (ref >=9.2)
HDL-C: 69 mg/dL (ref >=40)
LDL CALC: 81 mg/dL (ref <100)
LDL Particle Number: 924 nmol/L (ref <1000)
LDL SIZE: 21.6 nm (ref >20.5)
Large HDL-P: 12.5 umol/L (ref >=4.8)
Large VLDL-P: 0.9 nmol/L (ref <=2.7)
Triglycerides: 82 mg/dL (ref <150)
VLDL Size: 46.5 nm (ref <=46.6)

## 2013-10-18 ENCOUNTER — Other Ambulatory Visit: Payer: Self-pay | Admitting: General Surgery

## 2013-10-18 DIAGNOSIS — E7800 Pure hypercholesterolemia, unspecified: Secondary | ICD-10-CM

## 2013-10-24 ENCOUNTER — Ambulatory Visit: Payer: BC Managed Care – PPO | Admitting: Cardiology

## 2013-11-04 ENCOUNTER — Encounter: Payer: Self-pay | Admitting: *Deleted

## 2013-11-23 ENCOUNTER — Ambulatory Visit (INDEPENDENT_AMBULATORY_CARE_PROVIDER_SITE_OTHER): Payer: BC Managed Care – PPO | Admitting: Cardiology

## 2013-11-23 ENCOUNTER — Encounter: Payer: Self-pay | Admitting: Cardiology

## 2013-11-23 VITALS — BP 144/70 | HR 63 | Ht 62.0 in | Wt 120.2 lb

## 2013-11-23 DIAGNOSIS — I712 Thoracic aortic aneurysm, without rupture, unspecified: Secondary | ICD-10-CM

## 2013-11-23 DIAGNOSIS — E7800 Pure hypercholesterolemia, unspecified: Secondary | ICD-10-CM

## 2013-11-23 DIAGNOSIS — I2510 Atherosclerotic heart disease of native coronary artery without angina pectoris: Secondary | ICD-10-CM

## 2013-11-23 DIAGNOSIS — I359 Nonrheumatic aortic valve disorder, unspecified: Secondary | ICD-10-CM

## 2013-11-23 DIAGNOSIS — I10 Essential (primary) hypertension: Secondary | ICD-10-CM

## 2013-11-23 NOTE — Patient Instructions (Signed)
Your physician recommends that you continue on your current medications as directed. Please refer to the Current Medication list given to you today.  Your physician has requested that you have an echocardiogram. Echocardiography is a painless test that uses sound waves to create images of your heart. It provides your doctor with information about the size and shape of your heart and how well your heart's chambers and valves are working. This procedure takes approximately one hour. There are no restrictions for this procedure.   Your physician recommends that you return for NMR and ALT your scheduled date in December 2015   Your physician wants you to follow-up in: 1 Year with Dr Mallie Snooks will receive a reminder letter in the mail two months in advance. If you don't receive a letter, please call our office to schedule the follow-up appointment.

## 2013-11-23 NOTE — Progress Notes (Signed)
[place], [address] [place], [place]  [address] Phone: [phone] Fax:  [phone]  Date:  11/23/2013   ID:  Debra [name], DOB 26-Aug-1950, MRN [id number]  PCP:  Reginia Naas, MD  Cardiologist:  Fransico Him, MD   History of Present Illness: Debra [name] is a 63 y.o. female with a history of thoracic aortic aneurysm followed by Dr. Prescott Gum, nonobstructive ASCAD, HTN, bicuspid AV with mild AS and dyslipidemia who presents today for followup.  She is doing well today.  She denies any chest pain, SOB, DOE, LE edema, dizziness, palpitations or syncope.  She has been having problems with abdominal pain recently and is currently having a workup with Dr. Watt Climes.   Wt Readings from Last 3 Encounters:  11/23/13 120 lb 3.2 oz (54.522 kg)  08/16/13 122 lb (55.339 kg)  08/01/13 122 lb (55.339 kg)     Past Medical History  Diagnosis Date  . Kidney stone   . Aortic aneurysm   . Thoracic aortic aneurysm     mild, 4.1 cm by recent CT followed by Dr [name]  . Coronary artery disease     nonobstructive  . H/O echocardiogram     bicuspid aortic valve with moderate aortic valve sclerosis,mild PR.TR.MR and mildly dilated aorta by echo on 8.2012  . Mild mitral regurgitation   . Hypertension     Current Outpatient Prescriptions  Medication Sig Dispense Refill  . aspirin 81 MG tablet Take 81 mg by mouth daily.      . metoprolol succinate (TOPROL-XL) 50 MG 24 hr tablet Take 50 mg by mouth daily. Take with or immediately following a meal.      . ondansetron (ZOFRAN) 8 MG tablet Take 8 mg by mouth every 8 (eight) hours as needed for nausea or vomiting (nausea).      . simvastatin (ZOCOR) 20 MG tablet Take 20 mg by mouth daily.      . traMADol (ULTRAM) 50 MG tablet Take 25 mg by mouth every 6 (six) hours as needed (pain). Takes half a tablet       No current facility-administered medications for this visit.    Allergies:    Allergies  Allergen Reactions  .  Codeine Anaphylaxis  . Penicillins Anaphylaxis    Social History:  The patient  reports that she has never smoked. She has never used smokeless tobacco. She reports that she drinks alcohol. She reports that she does not use illicit drugs.   Family History:  The patient's family history includes CAD in her maternal grandfather, paternal grandfather, and paternal grandmother.   ROS:  Please see the history of present illness.      All other systems reviewed and negative.   PHYSICAL EXAM: VS:  BP 144/70  Pulse 63  Ht 5\' 2"  (1.575 m)  Wt 120 lb 3.2 oz (54.522 kg)  BMI 21.98 kg/m2 Well nourished, well developed, in no acute distress HEENT: normal Neck: no JVD Cardiac:  normal S1, S2; RRR; no murmur Lungs:  clear to auscultation bilaterally, no wheezing, rhonchi or rales Abd: soft, nontender, no hepatomegaly Ext: no edema Skin: warm and dry Neuro:  CNs 2-12 intact, no focal abnormalities noted  EKG:   NSR with no ST changes, occ PVC's    ASSESSMENT AND PLAN:  1. ASCAD with no angina - continue ASA 2. HTN - well controlled - continue metoprolol 3. Dyslipidemia - she has not been compliant with her diet or her medication but is  now back on the statin daily - will check FLP and ALT in 2 months - continue simvastatin 4. Thoracic aortic aneurysm followed by Dr. Prescott Gum 5. Bicuspid aortic valve with mild AS - recheck 2D echo  Followup with me in 1 year  Signed, Fransico Him, MD 11/23/2013 8:27 AM

## 2013-12-05 ENCOUNTER — Ambulatory Visit (HOSPITAL_COMMUNITY): Payer: BC Managed Care – PPO | Attending: Cardiovascular Disease | Admitting: Cardiology

## 2013-12-05 DIAGNOSIS — E785 Hyperlipidemia, unspecified: Secondary | ICD-10-CM | POA: Diagnosis not present

## 2013-12-05 DIAGNOSIS — I359 Nonrheumatic aortic valve disorder, unspecified: Secondary | ICD-10-CM | POA: Diagnosis present

## 2013-12-05 DIAGNOSIS — I10 Essential (primary) hypertension: Secondary | ICD-10-CM | POA: Diagnosis not present

## 2013-12-05 NOTE — Progress Notes (Signed)
Echo performed. 

## 2013-12-18 ENCOUNTER — Other Ambulatory Visit: Payer: Self-pay | Admitting: *Deleted

## 2013-12-18 MED ORDER — SIMVASTATIN 20 MG PO TABS: 20.0000 mg | ORAL_TABLET | Freq: Every day | ORAL | Status: DC

## 2013-12-22 ENCOUNTER — Other Ambulatory Visit: Payer: Self-pay

## 2013-12-26 ENCOUNTER — Other Ambulatory Visit: Payer: Self-pay

## 2013-12-26 DIAGNOSIS — Z1239 Encounter for other screening for malignant neoplasm of breast: Secondary | ICD-10-CM

## 2014-01-08 ENCOUNTER — Ambulatory Visit: Payer: BC Managed Care – PPO

## 2014-01-16 ENCOUNTER — Ambulatory Visit: Payer: BC Managed Care – PPO

## 2014-01-17 ENCOUNTER — Other Ambulatory Visit: Payer: Self-pay

## 2014-01-17 DIAGNOSIS — Z1231 Encounter for screening mammogram for malignant neoplasm of breast: Secondary | ICD-10-CM

## 2014-01-18 ENCOUNTER — Ambulatory Visit
Admission: RE | Admit: 2014-01-18 | Discharge: 2014-01-18 | Disposition: A | Discharge status: Home / Self Care | Payer: Medicare Other | Source: Ambulatory Visit

## 2014-01-18 DIAGNOSIS — Z1231 Encounter for screening mammogram for malignant neoplasm of breast: Secondary | ICD-10-CM

## 2014-02-02 ENCOUNTER — Other Ambulatory Visit: Payer: Self-pay

## 2014-02-02 MED ORDER — METOPROLOL SUCCINATE ER 50 MG PO TB24: 50.0000 mg | ORAL_TABLET | Freq: Every day | ORAL | Status: DC

## 2014-02-19 ENCOUNTER — Other Ambulatory Visit (INDEPENDENT_AMBULATORY_CARE_PROVIDER_SITE_OTHER): Payer: Medicare Other | Admitting: *Deleted

## 2014-02-19 DIAGNOSIS — E7800 Pure hypercholesterolemia, unspecified: Secondary | ICD-10-CM

## 2014-02-19 LAB — HEPATIC FUNCTION PANEL
ALT: 9 U/L (ref 0–35)
AST: 16 U/L (ref 0–37)
Albumin: 3.7 g/dL (ref 3.5–5.2)
Alkaline Phosphatase: 73 U/L (ref 39–117)
Bilirubin, Direct: 0.1 mg/dL (ref 0.0–0.3)
TOTAL PROTEIN: 5.7 g/dL — AB (ref 6.0–8.3)
Total Bilirubin: 1.2 mg/dL (ref 0.2–1.2)

## 2014-02-21 LAB — NMR LIPOPROFILE WITH LIPIDS
Cholesterol, Total: 141 mg/dL (ref 100–199)
HDL Particle Number: 36.4 umol/L (ref >=30.5)
HDL Size: 10 nm (ref >=9.2)
HDL-C: 69 mg/dL (ref >39)
LARGE VLDL-P: 0.9 nmol/L (ref <=2.7)
LDL (calc): 54 mg/dL (ref 0–99)
LDL PARTICLE NUMBER: 581 nmol/L (ref <1000)
LDL Size: 21.3 nm (ref >=20.8)
LP-IR Score: <25 (ref <=45)
Large HDL-P: 12.3 umol/L (ref >=4.8)
Triglycerides: 92 mg/dL (ref 0–149)
VLDL SIZE: 44.8 nm (ref <=46.6)

## 2014-04-11 ENCOUNTER — Telehealth: Payer: Self-pay | Admitting: Cardiology

## 2014-04-11 ENCOUNTER — Ambulatory Visit (INDEPENDENT_AMBULATORY_CARE_PROVIDER_SITE_OTHER): Payer: Self-pay | Admitting: General Surgery

## 2014-04-11 DIAGNOSIS — R933 Abnormal findings on diagnostic imaging of other parts of digestive tract: Secondary | ICD-10-CM | POA: Diagnosis not present

## 2014-04-11 NOTE — Telephone Encounter (Signed)
Patient has a history of nonobstructive ASCAD and dilated aortic root with bicuspid AV last seen 11/23/2013 and doing well from a cardiac standpoint.  2D echo 11/2013 showed normal LVF with mild AS.  Patient is stable from a cardiac standpoint for surgery and is considered low risk from a cardiac standpoint.

## 2014-04-12 NOTE — Telephone Encounter (Signed)
I left a message for the patient to call and confirm which surgery she is having and where.

## 2014-04-13 ENCOUNTER — Telehealth: Payer: Self-pay | Admitting: Cardiology

## 2014-04-13 NOTE — Telephone Encounter (Signed)
Follow up         Surgery information: [facility]   Dr. [name]   Abdominal Adhesion

## 2014-04-13 NOTE — Telephone Encounter (Signed)
Surgical clearance to Dr. [name].

## 2014-04-20 ENCOUNTER — Telehealth: Payer: Self-pay | Admitting: Cardiology

## 2014-04-20 NOTE — Telephone Encounter (Signed)
New Message        Pt calling stating that a form was suppose to be faxed to Dr. [name] in regards to her surg clearance and that the office never received it. Pt is wanting to check on the status of this form. Please call pt back and advise.

## 2014-04-20 NOTE — Telephone Encounter (Signed)
Informed patient that clearance was sent to Dr. Biagio Borg in-basket.  Printed and placed clearance in "to be faxed" bin.

## 2014-05-04 ENCOUNTER — Encounter (HOSPITAL_COMMUNITY): Payer: Self-pay

## 2014-05-04 ENCOUNTER — Encounter (HOSPITAL_COMMUNITY)
Admission: RE | Admit: 2014-05-04 | Discharge: 2014-05-04 | Disposition: A | Discharge status: Home / Self Care | Payer: Medicare Other | Source: Ambulatory Visit | Attending: General Surgery | Admitting: General Surgery

## 2014-05-04 HISTORY — DX: Unspecified osteoarthritis, unspecified site: M19.90

## 2014-05-04 LAB — BASIC METABOLIC PANEL
ANION GAP: 6 (ref 5–15)
BUN: 13 mg/dL (ref 6–23)
CO2: 26 mmol/L (ref 19–32)
CREATININE: 0.71 mg/dL (ref 0.50–1.10)
Calcium: 9 mg/dL (ref 8.4–10.5)
Chloride: 107 mmol/L (ref 96–112)
GFR calc Af Amer: >90 mL/min (ref >90)
GFR calc non Af Amer: 89 mL/min — ABNORMAL LOW (ref >90)
Glucose, Bld: 93 mg/dL (ref 70–99)
Potassium: 4.5 mmol/L (ref 3.5–5.1)
Sodium: 139 mmol/L (ref 135–145)

## 2014-05-04 LAB — CBC
HCT: 39.8 % (ref 36.0–46.0)
Hemoglobin: 12.9 g/dL (ref 12.0–15.0)
MCH: 27.7 pg (ref 26.0–34.0)
MCHC: 32.4 g/dL (ref 30.0–36.0)
MCV: 85.6 fL (ref 78.0–100.0)
PLATELETS: 215 10*3/uL (ref 150–400)
RBC: 4.65 MIL/uL (ref 3.87–5.11)
RDW: 14.2 % (ref 11.5–15.5)
WBC: 8.5 10*3/uL (ref 4.0–10.5)

## 2014-05-04 NOTE — Progress Notes (Signed)
Anesthesia Chart Review:  Pt is 64 year old female scheduled for laparoscopic lysis of adhesions, exploratory laparoscopy on 05/14/2014 with Dr. [name].   Cardiologist is Dr. Radford Pax. PCP is [facility].   PMH includes: CAD, HTN, mild mitral regurgitation, thoracic aortic aneurysm, bicuspid AV with mild AS, dyslipidemia.   Preoperative labs reviewed.    EKG 11/23/2013: Sinus rhythm with occasional PVCs.   Echo 12/05/2013: - Left ventricle: The cavity size was normal. Systolic function was normal. The estimated ejection fraction was in the range of 55% to 60%. Wall motion was normal; there were no regional wall motion abnormalities. - Aortic valve: Bicuspid; normal thickness leaflets. There was verymild stenosis. There was trivial regurgitation. Mean gradient (S): 11 mm Hg. - Aorta: Ascending aortic diameter: 40 mm (S). - Mitral valve: There was mild regurgitation.  Pt has cardiac clearance from Dr. Radford Pax in telephone encounter in [place] dated 04/11/2014.   If no changes, I anticipate pt can proceed with surgery as scheduled.   Willeen Cass, FNP-BC [facility]/Anesthesiology Phone: [phone] 05/04/2014 2:26 PM

## 2014-05-04 NOTE — Pre-Procedure Instructions (Signed)
SADEY YANDELL  05/04/2014   Your procedure is scheduled on:  05/14/14  Report to [facility] Admitting at 530 AM.  Call this number if you have problems the morning of surgery: [phone]   Remember:   Do not eat food or drink liquids after midnight.   Take these medicines the morning of surgery with A SIP OF WATER: metoprolol,ultram   Do not wear jewelry, make-up or nail polish.  Do not wear lotions, powders, or perfumes. You may wear deodorant.  Do not shave 48 hours prior to surgery. Men may shave face and neck.  Do not bring valuables to the hospital.  [facility] is not responsible                  for any belongings or valuables.               Contacts, dentures or bridgework may not be worn into surgery.  Leave suitcase in the car. After surgery it may be brought to your room.  For patients admitted to the hospital, discharge time is determined by your                treatment team.               Patients discharged the day of surgery will not be allowed to drive  home.  Name and phone number of your driver: family  Special Instructions: Shower using CHG 2 nights before surgery and the night before surgery.  If you shower the day of surgery use CHG.  Use special wash - you have one bottle of CHG for all showers.  You should use approximately 1/3 of the bottle for each shower.   Please read over the following fact sheets that you were given: Pain Booklet, Coughing and Deep Breathing and Surgical Site Infection Prevention

## 2014-05-13 MED ORDER — METRONIDAZOLE IN NACL 5-0.79 MG/ML-% IV SOLN
500.0000 mg | INTRAVENOUS | Status: AC
  Administered 2014-05-14: 500 mg via INTRAVENOUS
  Filled 2014-05-13 (×2): qty 100

## 2014-05-13 MED ORDER — CIPROFLOXACIN IN D5W 400 MG/200ML IV SOLN
400.0000 mg | INTRAVENOUS | Status: AC
  Administered 2014-05-14: 400 mg via INTRAVENOUS
  Filled 2014-05-13: qty 200

## 2014-05-14 ENCOUNTER — Encounter (HOSPITAL_COMMUNITY): Payer: Self-pay | Admitting: *Deleted

## 2014-05-14 ENCOUNTER — Encounter (HOSPITAL_COMMUNITY)
Admission: RE | Disposition: A | Discharge status: Home / Self Care | Payer: Self-pay | Source: Ambulatory Visit | Attending: General Surgery

## 2014-05-14 ENCOUNTER — Ambulatory Visit (HOSPITAL_COMMUNITY): Payer: Medicare Other | Admitting: Anesthesiology

## 2014-05-14 ENCOUNTER — Ambulatory Visit (HOSPITAL_COMMUNITY): Payer: Medicare Other | Admitting: Emergency Medicine

## 2014-05-14 ENCOUNTER — Inpatient Hospital Stay (HOSPITAL_COMMUNITY)
Admission: RE | Admit: 2014-05-14 | Discharge: 2014-05-16 | DRG: 337 | Disposition: A | Discharge status: Home / Self Care | Payer: Medicare Other | Source: Ambulatory Visit | Attending: General Surgery | Admitting: General Surgery

## 2014-05-14 DIAGNOSIS — Z803 Family history of malignant neoplasm of breast: Secondary | ICD-10-CM

## 2014-05-14 DIAGNOSIS — I2510 Atherosclerotic heart disease of native coronary artery without angina pectoris: Secondary | ICD-10-CM | POA: Diagnosis present

## 2014-05-14 DIAGNOSIS — K660 Peritoneal adhesions (postprocedural) (postinfection): Secondary | ICD-10-CM | POA: Diagnosis present

## 2014-05-14 DIAGNOSIS — R1030 Lower abdominal pain, unspecified: Secondary | ICD-10-CM | POA: Diagnosis not present

## 2014-05-14 DIAGNOSIS — M1990 Unspecified osteoarthritis, unspecified site: Secondary | ICD-10-CM | POA: Diagnosis present

## 2014-05-14 DIAGNOSIS — K5660 Unspecified intestinal obstruction: Secondary | ICD-10-CM | POA: Diagnosis not present

## 2014-05-14 DIAGNOSIS — Z90710 Acquired absence of both cervix and uterus: Secondary | ICD-10-CM | POA: Diagnosis not present

## 2014-05-14 DIAGNOSIS — Z7982 Long term (current) use of aspirin: Secondary | ICD-10-CM | POA: Diagnosis not present

## 2014-05-14 DIAGNOSIS — E785 Hyperlipidemia, unspecified: Secondary | ICD-10-CM | POA: Diagnosis present

## 2014-05-14 DIAGNOSIS — I080 Rheumatic disorders of both mitral and aortic valves: Secondary | ICD-10-CM | POA: Diagnosis present

## 2014-05-14 DIAGNOSIS — I10 Essential (primary) hypertension: Secondary | ICD-10-CM | POA: Diagnosis present

## 2014-05-14 DIAGNOSIS — Z8249 Family history of ischemic heart disease and other diseases of the circulatory system: Secondary | ICD-10-CM

## 2014-05-14 DIAGNOSIS — Z885 Allergy status to narcotic agent status: Secondary | ICD-10-CM

## 2014-05-14 DIAGNOSIS — Z880 Allergy status to penicillin: Secondary | ICD-10-CM

## 2014-05-14 DIAGNOSIS — D3A019 Benign carcinoid tumor of the small intestine, unspecified portion: Secondary | ICD-10-CM | POA: Diagnosis not present

## 2014-05-14 DIAGNOSIS — Z9049 Acquired absence of other specified parts of digestive tract: Secondary | ICD-10-CM

## 2014-05-14 DIAGNOSIS — C7A012 Malignant carcinoid tumor of the ileum: Secondary | ICD-10-CM | POA: Diagnosis present

## 2014-05-14 DIAGNOSIS — C7A019 Malignant carcinoid tumor of the small intestine, unspecified portion: Secondary | ICD-10-CM | POA: Insufficient documentation

## 2014-05-14 DIAGNOSIS — N200 Calculus of kidney: Secondary | ICD-10-CM | POA: Diagnosis present

## 2014-05-14 HISTORY — PX: LAPAROSCOPIC SMALL BOWEL RESECTION: SHX5929

## 2014-05-14 HISTORY — PX: LAPAROSCOPIC LYSIS OF ADHESIONS: SHX5905

## 2014-05-14 LAB — CBC
HCT: 36.7 % (ref 36.0–46.0)
Hemoglobin: 12.1 g/dL (ref 12.0–15.0)
MCH: 28.2 pg (ref 26.0–34.0)
MCHC: 33 g/dL (ref 30.0–36.0)
MCV: 85.5 fL (ref 78.0–100.0)
PLATELETS: 190 10*3/uL (ref 150–400)
RBC: 4.29 MIL/uL (ref 3.87–5.11)
RDW: 14.2 % (ref 11.5–15.5)
WBC: 9 10*3/uL (ref 4.0–10.5)

## 2014-05-14 LAB — CREATININE, SERUM
Creatinine, Ser: 0.66 mg/dL (ref 0.50–1.10)
GFR calc Af Amer: >90 mL/min (ref >90)

## 2014-05-14 SURGERY — LYSIS, ADHESIONS, LAPAROSCOPIC
Anesthesia: General | Site: Abdomen

## 2014-05-14 MED ORDER — BUPIVACAINE-EPINEPHRINE 0.25% -1:200000 IJ SOLN
INTRAMUSCULAR | Status: DC | PRN
  Administered 2014-05-14: 18 mL

## 2014-05-14 MED ORDER — PROPOFOL 10 MG/ML IV BOLUS
INTRAVENOUS | Status: AC
  Filled 2014-05-14: qty 20

## 2014-05-14 MED ORDER — FENTANYL CITRATE 0.05 MG/ML IJ SOLN
INTRAMUSCULAR | Status: AC
  Filled 2014-05-14: qty 5

## 2014-05-14 MED ORDER — GLYCOPYRROLATE 0.2 MG/ML IJ SOLN
INTRAMUSCULAR | Status: AC
  Filled 2014-05-14: qty 2

## 2014-05-14 MED ORDER — 0.9 % SODIUM CHLORIDE (POUR BTL) OPTIME
TOPICAL | Status: DC | PRN
  Administered 2014-05-14: 1000 mL

## 2014-05-14 MED ORDER — SODIUM CHLORIDE 0.9 % IJ SOLN
INTRAMUSCULAR | Status: AC
  Filled 2014-05-14: qty 10

## 2014-05-14 MED ORDER — ROCURONIUM BROMIDE 50 MG/5ML IV SOLN
INTRAVENOUS | Status: AC
Start: 2014-05-14 — End: 2014-05-14
  Filled 2014-05-14: qty 1

## 2014-05-14 MED ORDER — ONDANSETRON HCL 4 MG/2ML IJ SOLN: 4.0000 mg | Freq: Four times a day (QID) | INTRAMUSCULAR | Status: DC | PRN

## 2014-05-14 MED ORDER — ROCURONIUM BROMIDE 100 MG/10ML IV SOLN
INTRAVENOUS | Status: DC | PRN
  Administered 2014-05-14: 35 mg via INTRAVENOUS

## 2014-05-14 MED ORDER — LIDOCAINE HCL (CARDIAC) 20 MG/ML IV SOLN
INTRAVENOUS | Status: DC | PRN
  Administered 2014-05-14: 20 mg via INTRAVENOUS

## 2014-05-14 MED ORDER — LIDOCAINE HCL (CARDIAC) 20 MG/ML IV SOLN
INTRAVENOUS | Status: AC
  Filled 2014-05-14: qty 5

## 2014-05-14 MED ORDER — CHLORHEXIDINE GLUCONATE 4 % EX LIQD: 1.0000 "application " | Freq: Once | CUTANEOUS | Status: DC

## 2014-05-14 MED ORDER — NEOSTIGMINE METHYLSULFATE 10 MG/10ML IV SOLN
INTRAVENOUS | Status: DC | PRN
  Administered 2014-05-14: 3 mg via INTRAVENOUS

## 2014-05-14 MED ORDER — FENTANYL CITRATE 0.05 MG/ML IJ SOLN: 50.0000 ug | INTRAMUSCULAR | Status: DC | PRN

## 2014-05-14 MED ORDER — GLYCOPYRROLATE 0.2 MG/ML IJ SOLN
INTRAMUSCULAR | Status: DC | PRN
  Administered 2014-05-14: 0.4 mg via INTRAVENOUS

## 2014-05-14 MED ORDER — LACTATED RINGERS IV SOLN
INTRAVENOUS | Status: DC | PRN
  Administered 2014-05-14 (×3): via INTRAVENOUS

## 2014-05-14 MED ORDER — KCL IN DEXTROSE-NACL 20-5-0.45 MEQ/L-%-% IV SOLN
INTRAVENOUS | Status: DC
  Administered 2014-05-14 (×2): via INTRAVENOUS
  Filled 2014-05-14 (×5): qty 1000

## 2014-05-14 MED ORDER — MIDAZOLAM HCL 5 MG/5ML IJ SOLN
INTRAMUSCULAR | Status: DC | PRN
  Administered 2014-05-14: 2 mg via INTRAVENOUS

## 2014-05-14 MED ORDER — BUPIVACAINE-EPINEPHRINE (PF) 0.25% -1:200000 IJ SOLN
INTRAMUSCULAR | Status: AC
  Filled 2014-05-14: qty 30

## 2014-05-14 MED ORDER — ONDANSETRON HCL 4 MG/2ML IJ SOLN
INTRAMUSCULAR | Status: DC | PRN
  Administered 2014-05-14: 4 mg via INTRAVENOUS

## 2014-05-14 MED ORDER — PROPOFOL 10 MG/ML IV BOLUS
INTRAVENOUS | Status: DC | PRN
  Administered 2014-05-14: 80 mg via INTRAVENOUS

## 2014-05-14 MED ORDER — PHENYLEPHRINE HCL 10 MG/ML IJ SOLN
INTRAMUSCULAR | Status: DC | PRN
  Administered 2014-05-14 (×5): 80 ug via INTRAVENOUS

## 2014-05-14 MED ORDER — METOPROLOL SUCCINATE ER 50 MG PO TB24
50.0000 mg | ORAL_TABLET | Freq: Every day | ORAL | Status: DC
  Administered 2014-05-14 – 2014-05-15 (×2): 50 mg via ORAL
  Filled 2014-05-14 (×3): qty 1

## 2014-05-14 MED ORDER — MIDAZOLAM HCL 2 MG/2ML IJ SOLN
INTRAMUSCULAR | Status: AC
  Filled 2014-05-14: qty 2

## 2014-05-14 MED ORDER — KETOROLAC TROMETHAMINE 30 MG/ML IJ SOLN
INTRAMUSCULAR | Status: AC
  Filled 2014-05-14: qty 1

## 2014-05-14 MED ORDER — PHENYLEPHRINE 40 MCG/ML (10ML) SYRINGE FOR IV PUSH (FOR BLOOD PRESSURE SUPPORT)
PREFILLED_SYRINGE | INTRAVENOUS | Status: AC
  Filled 2014-05-14: qty 10

## 2014-05-14 MED ORDER — ENOXAPARIN SODIUM 40 MG/0.4ML ~~LOC~~ SOLN
40.0000 mg | SUBCUTANEOUS | Status: DC
  Administered 2014-05-15 – 2014-05-16 (×2): 40 mg via SUBCUTANEOUS
  Filled 2014-05-14 (×2): qty 0.4

## 2014-05-14 MED ORDER — PROMETHAZINE HCL 25 MG/ML IJ SOLN
6.2500 mg | INTRAMUSCULAR | Status: DC | PRN
  Administered 2014-05-14: 12.5 mg via INTRAVENOUS

## 2014-05-14 MED ORDER — KETOROLAC TROMETHAMINE 30 MG/ML IJ SOLN
30.0000 mg | Freq: Once | INTRAMUSCULAR | Status: AC | PRN
  Administered 2014-05-14: 30 mg via INTRAVENOUS

## 2014-05-14 MED ORDER — HYDROMORPHONE HCL 1 MG/ML IJ SOLN: 0.2500 mg | INTRAMUSCULAR | Status: DC | PRN

## 2014-05-14 MED ORDER — FENTANYL CITRATE 0.05 MG/ML IJ SOLN
INTRAMUSCULAR | Status: DC | PRN
  Administered 2014-05-14: 50 ug via INTRAVENOUS
  Administered 2014-05-14 (×2): 100 ug via INTRAVENOUS
  Administered 2014-05-14: 150 ug via INTRAVENOUS

## 2014-05-14 MED ORDER — NEOSTIGMINE METHYLSULFATE 10 MG/10ML IV SOLN
INTRAVENOUS | Status: AC
  Filled 2014-05-14: qty 1

## 2014-05-14 MED ORDER — EPHEDRINE SULFATE 50 MG/ML IJ SOLN
INTRAMUSCULAR | Status: AC
  Filled 2014-05-14: qty 1

## 2014-05-14 MED ORDER — PROMETHAZINE HCL 25 MG/ML IJ SOLN
INTRAMUSCULAR | Status: AC
  Filled 2014-05-14: qty 1

## 2014-05-14 MED ORDER — SUCCINYLCHOLINE CHLORIDE 20 MG/ML IJ SOLN
INTRAMUSCULAR | Status: AC
  Filled 2014-05-14: qty 2

## 2014-05-14 MED ORDER — ONDANSETRON HCL 4 MG/2ML IJ SOLN
INTRAMUSCULAR | Status: AC
  Filled 2014-05-14: qty 2

## 2014-05-14 MED ORDER — ONDANSETRON HCL 4 MG PO TABS: 4.0000 mg | ORAL_TABLET | Freq: Four times a day (QID) | ORAL | Status: DC | PRN

## 2014-05-14 MED ORDER — TRAMADOL HCL 50 MG PO TABS: 25.0000 mg | ORAL_TABLET | Freq: Four times a day (QID) | ORAL | Status: DC | PRN

## 2014-05-14 SURGICAL SUPPLY — 67 items
APPLIER CLIP 5 13 M/L LIGAMAX5 (MISCELLANEOUS)
APR CLP MED LRG 5 ANG JAW (MISCELLANEOUS)
BAG SPEC RTRVL 10 TROC 200 (ENDOMECHANICALS)
BLADE SURG 10 STRL SS (BLADE) ×2 IMPLANT
BLADE SURG ROTATE 9660 (MISCELLANEOUS) ×2 IMPLANT
CANISTER SUCTION 2500CC (MISCELLANEOUS) ×3 IMPLANT
CHLORAPREP W/TINT 26ML (MISCELLANEOUS) ×3 IMPLANT
CLIP APPLIE 5 13 M/L LIGAMAX5 (MISCELLANEOUS) ×1 IMPLANT
COVER MAYO STAND STRL (DRAPES) IMPLANT
COVER SURGICAL LIGHT HANDLE (MISCELLANEOUS) ×3 IMPLANT
DECANTER SPIKE VIAL GLASS SM (MISCELLANEOUS) ×1 IMPLANT
DRAPE C-ARM 42X72 X-RAY (DRAPES) IMPLANT
DRAPE LAPAROSCOPIC ABDOMINAL (DRAPES) ×3 IMPLANT
ELECT CAUTERY BLADE 6.4 (BLADE) ×4 IMPLANT
ELECT REM PT RETURN 9FT ADLT (ELECTROSURGICAL) ×3
ELECTRODE REM PT RTRN 9FT ADLT (ELECTROSURGICAL) ×2 IMPLANT
FILTER SMOKE EVAC LAPAROSHD (FILTER) IMPLANT
GLOVE BIO SURGEON STRL SZ 6.5 (GLOVE) ×8 IMPLANT
GLOVE BIO SURGEON STRL SZ7 (GLOVE) ×2 IMPLANT
GLOVE BIO SURGEON STRL SZ8 (GLOVE) ×5 IMPLANT
GLOVE BIOGEL PI IND STRL 6.5 (GLOVE) ×1 IMPLANT
GLOVE BIOGEL PI IND STRL 7.0 (GLOVE) ×1 IMPLANT
GLOVE BIOGEL PI IND STRL 8 (GLOVE) ×2 IMPLANT
GLOVE BIOGEL PI INDICATOR 6.5 (GLOVE) ×1
GLOVE BIOGEL PI INDICATOR 7.0 (GLOVE) ×1
GLOVE BIOGEL PI INDICATOR 8 (GLOVE) ×1
GLOVE SURG SS PI 7.0 STRL IVOR (GLOVE) ×2 IMPLANT
GOWN STRL REUS W/ TWL LRG LVL3 (GOWN DISPOSABLE) ×6 IMPLANT
GOWN STRL REUS W/ TWL XL LVL3 (GOWN DISPOSABLE) ×2 IMPLANT
GOWN STRL REUS W/TWL LRG LVL3 (GOWN DISPOSABLE) ×9
GOWN STRL REUS W/TWL XL LVL3 (GOWN DISPOSABLE) ×3
KIT BASIN OR (CUSTOM PROCEDURE TRAY) ×3 IMPLANT
KIT ROOM TURNOVER OR (KITS) ×3 IMPLANT
LIGASURE IMPACT 36 18CM CVD LR (INSTRUMENTS) ×2 IMPLANT
LIQUID BAND (GAUZE/BANDAGES/DRESSINGS) ×3 IMPLANT
NEEDLE 22X1 1/2 (OR ONLY) (NEEDLE) ×1 IMPLANT
NS IRRIG 1000ML POUR BTL (IV SOLUTION) ×3 IMPLANT
PAD ARMBOARD 7.5X6 YLW CONV (MISCELLANEOUS) ×6 IMPLANT
PENCIL BUTTON HOLSTER BLD 10FT (ELECTRODE) ×2 IMPLANT
POUCH RETRIEVAL ECOSAC 10 (ENDOMECHANICALS) ×1 IMPLANT
POUCH RETRIEVAL ECOSAC 10MM (ENDOMECHANICALS)
RELOAD PROXIMATE 75MM BLUE (ENDOMECHANICALS) ×6 IMPLANT
RELOAD STAPLE 75 3.8 BLU REG (ENDOMECHANICALS) IMPLANT
SCISSORS LAP 5X35 DISP (ENDOMECHANICALS) ×3 IMPLANT
SET CHOLANGIOGRAPH 5 50 .035 (SET/KITS/TRAYS/PACK) IMPLANT
SET IRRIG TUBING LAPAROSCOPIC (IRRIGATION / IRRIGATOR) ×1 IMPLANT
SLEEVE ENDOPATH XCEL 5M (ENDOMECHANICALS) ×6 IMPLANT
SPECIMEN JAR SMALL (MISCELLANEOUS) ×1 IMPLANT
SPONGE LAP 18X18 X RAY DECT (DISPOSABLE) ×2 IMPLANT
STAPLER GUN LINEAR PROX 60 (STAPLE) ×2 IMPLANT
STAPLER PROXIMATE 75MM BLUE (STAPLE) ×2 IMPLANT
SUCTION POOLE TIP (SUCTIONS) ×2 IMPLANT
SUT PDS AB 1 CT  36 (SUTURE) ×1
SUT PDS AB 1 CT 36 (SUTURE) ×1 IMPLANT
SUT SILK 2 0 SH CR/8 (SUTURE) ×2 IMPLANT
SUT SILK 3 0 SH CR/8 (SUTURE) ×2 IMPLANT
SUT VIC AB 4-0 PS2 27 (SUTURE) ×3 IMPLANT
TOWEL OR 17X24 6PK STRL BLUE (TOWEL DISPOSABLE) ×3 IMPLANT
TOWEL OR 17X26 10 PK STRL BLUE (TOWEL DISPOSABLE) ×1 IMPLANT
TRAY FOLEY CATH 16FR SILVER (SET/KITS/TRAYS/PACK) ×2 IMPLANT
TRAY LAPAROSCOPIC (CUSTOM PROCEDURE TRAY) ×3 IMPLANT
TROCAR XCEL BLUNT TIP 100MML (ENDOMECHANICALS) ×3 IMPLANT
TROCAR XCEL NON-BLD 5MMX100MML (ENDOMECHANICALS) ×3 IMPLANT
TUBE CONNECTING 12X1/4 (SUCTIONS) ×2 IMPLANT
TUBING INSUFFLATION (TUBING) ×3 IMPLANT
WATER STERILE IRR 1000ML POUR (IV SOLUTION) IMPLANT
YANKAUER SUCT BULB TIP NO VENT (SUCTIONS) ×4 IMPLANT

## 2014-05-14 NOTE — H&P (Signed)
History of Present Illness Debra Neri E. [name] MD; 04/11/2014 9:48 AM) Patient words: abdominal adhesions.  The patient is a 64 year old female who presents with abdominal pain. Debra [name] has a 1.5 year history of intermittent crampy lower abdominal pain. When it started, it was happening every few months. The frequency has gradually increased, with last month being worse. She had several episodes last month. The pain is in her lower abdomen. When severe, she takes one half of an Ultram which gives her some relief. It seems to resolve spontaneously, however, it has been lasting longer recently. Bowel movements have been regular and daily. No blood or mucus in her bowel movements. She underwent CT enterography in May 2015. This demonstrated some significant small bowel abnormalities with wall thickening and some dilatation. She underwent another CT in July 2015. There was improvement and a lot of her small bowel, however, she still had a dilated loop over 3 cm in the pelvis in the region of her symptoms.Debra [name] asked me to see her in consultation regarding possible laparoscopy for evaluation and adhesions.   Other Problems Debra Buffy, RN; 04/11/2014 9:17 AM) Arthritis Back Pain Heart murmur Kidney Debra [name]  Past Surgical History Debra Buffy, RN; 04/11/2014 9:17 AM) Hysterectomy (not due to cancer) - Partial Oral Surgery  Diagnostic Studies History Debra Buffy, RN; 04/11/2014 9:17 AM) Colonoscopy 1-5 years ago Mammogram within last year Pap Smear 1-5 years ago  Allergies Debra Buffy, RN; 04/11/2014 9:19 AM) Codeine Phosphate *ANALGESICS - OPIOID* Anaphylaxis. Penicillins Anaphylaxis. HYDROmorphone HCl *ANALGESICS - OPIOID* ALL OPIOIDS!!!!  Medication History Debra Buffy, RN; 04/11/2014 9:20 AM) Metoprolol Tartrate (50MG  Tablet, Oral daily) Active. Simvastatin (20MG  Tablet, Oral daily) Active. Aspirin EC Low Strength (81MG  Tablet DR, Oral daily)  Active. Zofran ODT (8MG  Tablet Disperse, Oral as needed) Active. TraMADol HCl (50MG  Tablet, Oral as needed) Active.  Social History Debra Buffy, RN; 04/11/2014 9:17 AM) Alcohol use Moderate alcohol use. Caffeine use Carbonated beverages, Coffee, Tea. No drug use Tobacco use Never smoker.  Family History Debra Buffy, RN; 04/11/2014 9:17 AM) Breast Cancer Mother. Depression Daughter. Heart Disease Father. Heart disease in female family member before age 54  Pregnancy / Birth History Debra Buffy, RN; 04/11/2014 9:17 AM) Age at menarche 78 years. Contraceptive History Oral contraceptives. Gravida 2 Maternal age 31-30 Para 2  Review of Systems Debra Buffy RN; 04/11/2014 9:17 AM) General Not Present- Appetite Loss, Chills, Fatigue, Fever, Night Sweats, Weight Gain and Weight Loss. Skin Not Present- Change in Wart/Mole, Dryness, Hives, Jaundice, New Lesions, Non-Healing Wounds, Rash and Ulcer. HEENT Present- Wears glasses/contact lenses. Not Present- Earache, Hearing Loss, Hoarseness, Nose Bleed, Oral Ulcers, Ringing in the Ears, Seasonal Allergies, Sinus Pain, Sore Throat, Visual Disturbances and Yellow Eyes. Respiratory Not Present- Bloody sputum, Chronic Cough, Difficulty Breathing, Snoring and Wheezing. Breast Not Present- Breast Mass, Breast Pain, Nipple Discharge and Skin Changes. Cardiovascular Not Present- Chest Pain, Difficulty Breathing Lying Down, Leg Cramps, Palpitations, Rapid Heart Rate, Shortness of Breath and Swelling of Extremities. Gastrointestinal Present- Abdominal Pain. Not Present- Bloating, Bloody Stool, Change in Bowel Habits, Chronic diarrhea, Constipation, Difficulty Swallowing, Excessive gas, Gets full quickly at meals, Hemorrhoids, Indigestion, Nausea, Rectal Pain and Vomiting. Female Genitourinary Not Present- Frequency, Nocturia, Painful Urination, Pelvic Pain and Urgency. Musculoskeletal Not Present- Back Pain, Joint Pain, Joint  Stiffness, Muscle Pain, Muscle Weakness and Swelling of Extremities. Neurological Not Present- Decreased Memory, Fainting, Headaches, Numbness, Seizures, Tingling, Tremor, Trouble walking and Weakness. Psychiatric Not Present- Anxiety, Bipolar, Change in Sleep Pattern,  Depression, Fearful and Frequent crying. Endocrine Not Present- Cold Intolerance, Excessive Hunger, Hair Changes, Heat Intolerance, Hot flashes and New Diabetes. Hematology Not Present- Easy Bruising, Excessive bleeding, Gland problems, HIV and Persistent Infections.   Vitals Debra Shan Moffitt RN; 04/11/2014 9:17 AM) 04/11/2014 9:17 AM Weight: 119.4 lb Height: 62in Body Surface Area: 1.54 m Body Mass Index: 21.84 kg/m Temp.: 101F(Oral)  Pulse: 60 (Regular)  Resp.: 14 (Unlabored)  BP: 142/88 (Sitting, Left Arm, Standard)    Physical Exam (Debra Cazarez E. [name] MD; 04/11/2014 9:49 AM) General Mental Status-Alert. General Appearance-Consistent with stated age. Hydration-Well hydrated. Voice-Normal.  Head and Neck Head-normocephalic, atraumatic with no lesions or palpable masses.  Eye Eyeball - Bilateral-Extraocular movements intact. Sclera/Conjunctiva - Bilateral-No scleral icterus.  Chest and Lung Exam Chest and lung exam reveals -quiet, even and easy respiratory effort with no use of accessory muscles and on auscultation, normal breath sounds, no adventitious sounds and normal vocal resonance. Inspection Chest Wall - Normal. Back - normal.  Cardiovascular Cardiovascular examination reveals -on palpation PMI is normal in location and amplitude, no palpable S3 or S4. Normal cardiac borders., normal heart sounds, regular rate and rhythm with no murmurs, carotid auscultation reveals no bruits and normal pedal pulses bilaterally.  Abdomen Inspection Inspection of the abdomen reveals - No Hernias. Palpation/Percussion Palpation and Percussion of the abdomen reveal - Soft, Non Tender, No  Rebound tenderness, No Rigidity (guarding) and No hepatosplenomegaly. Auscultation Auscultation of the abdomen reveals - Bowel sounds normal. Note: Pfannenstiel scar   Neurologic Neurologic evaluation reveals -alert and oriented x 3 with no impairment of recent or remote memory. Mental Status-Normal.  Musculoskeletal Normal Exam - Left-Upper Extremity Strength Normal and Lower Extremity Strength Normal. Normal Exam - Right-Upper Extremity Strength Normal and Lower Extremity Strength Normal.    Assessment & Plan Debra Neri E. [name] MD; 04/11/2014 9:52 AM) ABNORMAL CT SCAN, SMALL BOWEL (793.4  R93.3) Impression: I believe her symptoms may be due to intermittent partial small bowel obstruction in her pelvis due to adhesions. I also feel further evaluation of her small bowel abnormalities as seen on previous CT scan is warranted. I have offered exploratory laparoscopy with lysis of adhesions. Her seizure, risks, and benefits were discussed in detail with her. I will do a gentle MiraLAX prep preoperatively. We discussed the expected postoperative course and I answered her questions. She is agreeable. We will obtain cardiac clearance from Dr. Radford Pax. I do not forsee any issues with this.   [name], MD, MPH, FACS Trauma: [phone] General Surgery: [phone]

## 2014-05-14 NOTE — Progress Notes (Signed)
Patient reports last dose of metoprolol 05/07/14. Patient reports that she get nauseated when she takes it. Patient reports that she is slightly nauseated from prep and taking antibiotics yesterday. Patient reports that she was not able to take the complete 3 dose set of PO antibiotics d/t nausea. Notified Dr. Kalman Shan about metoprolol advised not to give metoprolol. Ask patient to notify Dr. [name] regarding antibiotics.

## 2014-05-14 NOTE — Anesthesia Postprocedure Evaluation (Signed)
  Anesthesia Post-op Note  Patient: Debra [name]  Procedure(s) Performed: Procedure(s) (LRB): LAPAROSCOPIC LYSIS OF ADHESIONS x 67mins (N/A) LAPAROSCOPIC ASSISTED SMALL BOWEL RESECTION (N/A)  Patient Location: PACU  Anesthesia Type: General  Level of Consciousness: awake and alert   Airway and Oxygen Therapy: Patient Spontanous Breathing  Post-op Pain: mild  Post-op Assessment: Post-op Vital signs reviewed, Patient's Cardiovascular Status Stable, Respiratory Function Stable, Patent Airway and No signs of Nausea or vomiting  Last Vitals:  Filed Vitals:   05/14/14 0957  BP:   Pulse: 75  Temp:   Resp: 12    Post-op Vital Signs: stable   Complications: No apparent anesthesia complications

## 2014-05-14 NOTE — Interval H&P Note (Signed)
History and Physical Interval Note:  05/14/2014 6:59 AM  Debra [name]  has presented today for surgery, with the diagnosis of intermittandt bowel obstruction  The various methods of treatment have been discussed with the patient and family. After consideration of risks, benefits and other options for treatment, the patient has consented to  Procedure(s): LAPAROSCOPIC LYSIS OF ADHESIONS (N/A) DIAGNOSTIC LAPAROSCOPY  (N/A) as a surgical intervention .  The patient's history has been reviewed, patient re-examined, no change in status, stable for surgery.  I have reviewed the patient's chart and labs.  Questions were answered to the patient's satisfaction.     [name]

## 2014-05-14 NOTE — Transfer of Care (Signed)
Immediate Anesthesia Transfer of Care Note  Patient: Debra [name]  Procedure(s) Performed: Procedure(s): LAPAROSCOPIC LYSIS OF ADHESIONS x 64mins (N/A) LAPAROSCOPIC ASSISTED SMALL BOWEL RESECTION (N/A)  Patient Location: PACU  Anesthesia Type:General  Level of Consciousness: awake, alert , oriented and patient cooperative  Airway & Oxygen Therapy: Patient Spontanous Breathing and Patient connected to nasal cannula oxygen  Post-op Assessment: Report given to RN and Post -op Vital signs reviewed and stable  Post vital signs: Reviewed and stable  Last Vitals:  Filed Vitals:   05/14/14 0625  BP: 142/75  Pulse: 69  Temp: 36.7 C  Resp: 20    Complications: No apparent anesthesia complications

## 2014-05-14 NOTE — Anesthesia Preprocedure Evaluation (Signed)
Anesthesia Evaluation  Patient identified by MRN, date of birth, ID band Patient awake    Reviewed: Allergy & Precautions, NPO status , Patient's Chart, lab work & pertinent test results  Airway Mallampati: II  TM Distance: >3 FB Neck ROM: Full    Dental no notable dental hx.    Pulmonary neg pulmonary ROS,  breath sounds clear to auscultation  Pulmonary exam normal       Cardiovascular hypertension, + Peripheral Vascular Disease Rhythm:Regular Rate:Normal   Thoracic aortic aneurysm  mild, 4.1 cm by recent CT followed by Dr [name]     Neuro/Psych negative neurological ROS  negative psych ROS   GI/Hepatic negative GI ROS, Neg liver ROS,   Endo/Other  negative endocrine ROS  Renal/GU negative Renal ROS  negative genitourinary   Musculoskeletal negative musculoskeletal ROS (+)   Abdominal   Peds negative pediatric ROS (+)  Hematology negative hematology ROS (+)   Anesthesia Other Findings   Reproductive/Obstetrics negative OB ROS                             Anesthesia Physical Anesthesia Plan  ASA: III  Anesthesia Plan: General   Post-op Pain Management:    Induction: Intravenous and Rapid sequence  Airway Management Planned: Oral ETT  Additional Equipment:   Intra-op Plan:   Post-operative Plan: Extubation in OR  Informed Consent: I have reviewed the patients History and Physical, chart, labs and discussed the procedure including the risks, benefits and alternatives for the proposed anesthesia with the patient or authorized representative who has indicated his/her understanding and acceptance.   Dental advisory given  Plan Discussed with: CRNA and Surgeon  Anesthesia Plan Comments:         Anesthesia Quick Evaluation

## 2014-05-14 NOTE — Op Note (Addendum)
05/14/2014  9:14 AM  PATIENT:  Debra [name]  64 y.o. female  PRE-OPERATIVE DIAGNOSIS:  intermittent small bowel obstruction  POST-OPERATIVE DIAGNOSIS:  intermittent small bowel obstruction  PROCEDURE:  Procedure(s): LAPAROSCOPIC LYSIS OF ADHESIONS x 59mins LAPAROSCOPIC ASSISTED SMALL BOWEL RESECTION  SURGEON:  Surgeon(s): [name], MD  ASSISTANTS: none   ANESTHESIA:   local and general  EBL:  Total I/O In: 2000 [I.V.:2000] Out: 125 [Urine:50; Blood:75]  BLOOD ADMINISTERED:none  DRAINS: none   SPECIMEN:  Excision  DISPOSITION OF SPECIMEN:  PATHOLOGY  COUNTS:  YES  DICTATION: .Dragon Dictation Debra [name] has been having intermittent partial small bowel obstructions for over a year. She presents for exploratory laparoscopy with lysis of adhesions. She was identified in the preop holding area. Informed consent was obtained. She received intravenous antibiotics. She was brought to the operating room and general endotracheal anesthesia was administered by the anesthesia staff. Foley catheter was placed by nursing. Her abdomen was prepped and draped in a sterile fashion. Timeout procedure was performed. Infra-umbilical region was infiltrated with local. Infra-umbilical incision was made. Subcutaneous tissues tissues were dissected down revealing the anterior fascia. This was divided sharply along the midline. Peritoneal cavity was gradually entered under direct vision. There were omental adhesions beneath the umbilicus. 0 Vicryl pursestring was placed on the fascial opening. Hassan trocar was inserted. Abdomen was insufflated with carbon monoxide in standard fashion. Under direct vision, right lower quadrant 5 mm port was placed. Local was used at the port site. There were omental adhesions along the midline and down into the pelvis which were swept away. Small bowel was not involved with the adhesions. Total time of adhesiolysis was 15 minutes. Once this was completed, a left  lower quadrant 5 mm port was placed. The cecum was then identified. The terminal ileum was then run backwards from there. There were no significant small bowel adhesions but we did identify an area of obstruction in the proximal ileum. The bowel was dilated proximal to it and lesser caliber distal to it. There was not a discrete adhesion to lyse to open it up. Decision was made to resect this section of bowel. Prior to that it was run more proximally and while it was slightly dilated, no other abnormalities were seen. Pneumoperitoneum was released. Midline incision was extended. Fascia was opened the length of the incision. Small bowel was brought out and gradually run back to the abnormality. There was a palpable mass along the strictured area. There was a second small palpable mass 2 cm away. Both were less than a centimeter in size. This appeared to be the area of chronic partial obstruction. The bowel was divided proximally and distally to this area with GIA-75. Mesentery of this segment was taken down with LigaSure. Specimen was passed and sent to pathology. Side-to-side anastomosis was then accomplished with the small bowel using GIA-75. The common defect was closed with TA 60. Apical stitch of 2-0 silk 2 was placed. Mesenteric defect was closed with interrupted 2-0 silk. Superior silks were placed along the staple line to get good hemostasis. There was a nice patent anastomosis. It was viable. Anastomosis was returned to the abdomen. We irrigated and irrigation returned clear. We changed our gloves. Bowel was arranged to anatomic position. Midline fascia was closed with running #1 PDS. All 3 wounds were irrigated and the skin of each was closed with 4-0 Vicryl subcuticular followed by liquid band. She tolerated the procedure well without apparent complications and was taken recovery in stable  condition.      PATIENT DISPOSITION:  PACU - hemodynamically stable.   Delay start of Pharmacological VTE  agent (>24hrs) due to surgical blood loss or risk of bleeding:  no  [name], MD, MPH, FACS Pager: [phone]  3/7/20169:14 AM

## 2014-05-15 ENCOUNTER — Encounter (HOSPITAL_COMMUNITY): Payer: Self-pay | Admitting: General Surgery

## 2014-05-15 LAB — BASIC METABOLIC PANEL
Anion gap: 10 (ref 5–15)
BUN: 6 mg/dL (ref 6–23)
CHLORIDE: 114 mmol/L — AB (ref 96–112)
CO2: 19 mmol/L (ref 19–32)
Calcium: 8.2 mg/dL — ABNORMAL LOW (ref 8.4–10.5)
Creatinine, Ser: 0.67 mg/dL (ref 0.50–1.10)
GFR calc non Af Amer: >90 mL/min (ref >90)
GLUCOSE: 96 mg/dL (ref 70–99)
POTASSIUM: 4.2 mmol/L (ref 3.5–5.1)
Sodium: 143 mmol/L (ref 135–145)

## 2014-05-15 LAB — CBC
HCT: 35.9 % — ABNORMAL LOW (ref 36.0–46.0)
Hemoglobin: 11.7 g/dL — ABNORMAL LOW (ref 12.0–15.0)
MCH: 28.2 pg (ref 26.0–34.0)
MCHC: 32.6 g/dL (ref 30.0–36.0)
MCV: 86.5 fL (ref 78.0–100.0)
Platelets: 196 10*3/uL (ref 150–400)
RBC: 4.15 MIL/uL (ref 3.87–5.11)
RDW: 14.4 % (ref 11.5–15.5)
WBC: 6.6 10*3/uL (ref 4.0–10.5)

## 2014-05-15 NOTE — Progress Notes (Signed)
1 Day Post-Op  Subjective: Walked yesterday, no nausea. Tolerated clears.  Objective: Vital signs in last 24 hours: Temp:  [98 F (36.7 C)-99.1 F (37.3 C)] 98.4 F (36.9 C) (03/08 0554) Pulse Rate:  [70-101] 77 (03/08 0554) Resp:  [10-20] 16 (03/08 0554) BP: (116-153)/(59-78) 117/64 mmHg (03/08 0554) SpO2:  [98 %-100 %] 98 % (03/08 0554) Last BM Date: 05/13/14  Intake/Output from previous day: 03/07 0701 - 03/08 0700 In: 3295 [P.O.:120; I.V.:3175] Out: 525 [Urine:450; Blood:75] Intake/Output this shift:    General appearance: alert and cooperative Resp: clear to auscultation bilaterally Cardio: regular rate and rhythm GI: soft, incisions CDI, midline with some ecchymosis, active BS  Lab Results:   Recent Labs  05/14/14 1158 05/15/14 0552  WBC 9.0 6.6  HGB 12.1 11.7*  HCT 36.7 35.9*  PLT 190 196   BMET  Recent Labs  05/14/14 1158 05/15/14 0552  NA  --  PENDING  K  --  4.2  CL  --  PENDING  CO2  --  PENDING  GLUCOSE  --  96  BUN  --  6  CREATININE 0.66 0.67  CALCIUM  --  8.2*   PT/INR No results for input(s): LABPROT, INR in the last 72 hours. ABG No results for input(s): PHART, HCO3 in the last 72 hours.  Invalid input(s): PCO2, PO2  Studies/Results: No results found.  Anti-infectives: Anti-infectives    Start     Dose/Rate Route Frequency Ordered Stop   05/14/14 0600  metroNIDAZOLE (FLAGYL) IVPB 500 mg     500 mg 100 mL/hr over 60 Minutes Intravenous On call to O.R. 05/13/14 1501 05/14/14 0905   05/14/14 0600  ciprofloxacin (CIPRO) IVPB 400 mg     400 mg 200 mL/hr over 60 Minutes Intravenous On call to O.R. 05/13/14 1501 05/14/14 0810      Assessment/Plan: s/p Procedure(s): LAPAROSCOPIC LYSIS OF ADHESIONS x 48mins (N/A) LAPAROSCOPIC ASSISTED SMALL BOWEL RESECTION (N/A) POD#1 FEN - fulls, lytes P VTE - PAS, Lovenox HTN - home metoprolol Mobilize  LOS: 1 day    Debra [name] E 05/15/2014

## 2014-05-15 NOTE — Progress Notes (Signed)
MD Dennis Bast notified of dark liquid BM.  No new orders at this time.  Will continue to monitor. Debra [name]

## 2014-05-15 NOTE — Progress Notes (Signed)
Patient ID: Debra [name], female   DOB: 05-26-1950, 64 y.o.   MRN: [id number]  She passed 3 dark brown liquid BMs. Feeling well. Will advance diet for AM. I answered her questions and spoke with her husband. [name], MD, MPH, FACS Trauma: [phone] General Surgery: [phone]

## 2014-05-16 LAB — BASIC METABOLIC PANEL
Anion gap: 7 (ref 5–15)
BUN: 9 mg/dL (ref 6–23)
CO2: 26 mmol/L (ref 19–32)
Calcium: 8.9 mg/dL (ref 8.4–10.5)
Chloride: 110 mmol/L (ref 96–112)
Creatinine, Ser: 0.61 mg/dL (ref 0.50–1.10)
GFR calc Af Amer: >90 mL/min (ref >90)
Glucose, Bld: 123 mg/dL — ABNORMAL HIGH (ref 70–99)
POTASSIUM: 4.5 mmol/L (ref 3.5–5.1)
Sodium: 143 mmol/L (ref 135–145)

## 2014-05-16 LAB — CBC
HCT: 32.6 % — ABNORMAL LOW (ref 36.0–46.0)
HEMOGLOBIN: 10.7 g/dL — AB (ref 12.0–15.0)
MCH: 28.5 pg (ref 26.0–34.0)
MCHC: 32.8 g/dL (ref 30.0–36.0)
MCV: 86.7 fL (ref 78.0–100.0)
PLATELETS: 199 10*3/uL (ref 150–400)
RBC: 3.76 MIL/uL — AB (ref 3.87–5.11)
RDW: 14.5 % (ref 11.5–15.5)
WBC: 7.2 10*3/uL (ref 4.0–10.5)

## 2014-05-16 NOTE — Discharge Summary (Signed)
Physician Discharge Summary  Patient ID: Debra [name] MRN: [id number] DOB/AGE: 1950-10-16 64 y.o.  Admit date: 05/14/2014 Discharge date: 05/16/2014  Admission Diagnoses: Intermittent partial small bowel obstruction  Discharge Diagnoses: Status post laparoscopic lysis of adhesions and laparoscopic-assisted small bowel resection Active Problems:   S/P small bowel resection   Discharged Condition: good  Hospital Course: Patient underwent laparoscopic lysis of adhesions and laparoscopic assisted small bowel resection. 2 small tumors were found in a segment of small bowel that appeared to be the site of transition in caliber representing partial obstruction. Postoperatively, she had excellent pain control and tolerated gradual advancement of her diet. Preliminary discussions with pathology revealed these tumors are likely carcinoids. Final report is pending. We had a long discussion regarding further testing and follow-up in light of this diagnosis. She is discharged home in stable condition.  Consults: None  Significant Diagnostic Studies: N/A  Treatments: surgery: above  Discharge Exam: Blood pressure 131/73, pulse 91, temperature 98.7 F (37.1 C), temperature source Oral, resp. rate 16, height 5\' 2"  (1.575 m), weight 120 lb (54.432 kg), SpO2 100 %. General appearance: alert and cooperative Resp: clear to auscultation bilaterally Cardio: regular rate and rhythm GI: Soft, nondistended, active bowel sounds, incisions clean dry and intact, mild evolving ecchymosis midline incision  Disposition: 01-Home or Self Care  Discharge Instructions    Diet - low sodium heart healthy    Complete by:  As directed      Discharge instructions    Complete by:  As directed   You may shower today. You may take a bath on Friday. No dietary restrictions. Do not lift over 10 pounds for 6 weeks.     Increase activity slowly    Complete by:  As directed      No dressing needed    Complete by:  As  directed             Medication List    TAKE these medications        aspirin 81 MG tablet  Take 81 mg by mouth daily.     metoprolol succinate 50 MG 24 hr tablet  Commonly known as:  TOPROL-XL  Take 1 tablet (50 mg total) by mouth daily. Take with or immediately following a meal.     ondansetron 8 MG tablet  Commonly known as:  ZOFRAN  Take 8 mg by mouth every 8 (eight) hours as needed for nausea or vomiting (nausea).     simvastatin 20 MG tablet  Commonly known as:  ZOCOR  Take 1 tablet (20 mg total) by mouth daily.     traMADol 50 MG tablet  Commonly known as:  ULTRAM  Take 25 mg by mouth every 6 (six) hours as needed for moderate pain. Takes half a tablet           Follow-up Information    Follow up with Zenovia Jarred, MD On 05/30/2014.   Specialty:  General Surgery   Why:  as scheduled, For wound re-check   Contact information:   [address] [id number] [phone]       Signed: Zenovia Jarred 05/16/2014, 9:10 AM

## 2014-05-16 NOTE — Progress Notes (Signed)
Discharge home. Home discharge instruction given, no question verbalized. 

## 2014-05-24 NOTE — Progress Notes (Signed)
Patient ID: Debra [name], female   DOB: 1950/09/29, 64 y.o.   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 [name], MD, MPH, FACS Trauma: [phone] General Surgery: [phone]

## 2014-05-30 ENCOUNTER — Other Ambulatory Visit: Payer: Self-pay | Admitting: General Surgery

## 2014-05-30 DIAGNOSIS — C7A00 Malignant carcinoid tumor of unspecified site: Secondary | ICD-10-CM

## 2014-05-31 ENCOUNTER — Telehealth: Payer: Self-pay | Admitting: Oncology

## 2014-05-31 NOTE — Telephone Encounter (Signed)
Called pt and left a message containing her appt date and time that Dr. Benay Spice set for her.    Debra [name] 06/26/14 1:30pm  Dx: Carcinoid Tumor of Small Intestine Referring: [facility]  If patient asks, it is ok for her to wait this long for the appointment per Susan/Dr. Benay Spice.

## 2014-06-01 ENCOUNTER — Telehealth: Payer: Self-pay | Admitting: Oncology

## 2014-06-01 NOTE — Telephone Encounter (Signed)
CALLED PT AND OFFERED HER 04/28 @ 1:30PM OR 05/02 @ 1:30 PM PER SUSAN/DR. SHERRILL.  LEFT MESSAGE.  TG

## 2014-06-01 NOTE — Telephone Encounter (Signed)
patient called back and stated [place] was [place] for this appointment date and time.  I sent Manuela Schwartz an inbasket for new date and time.

## 2014-06-04 ENCOUNTER — Telehealth: Payer: Self-pay | Admitting: Oncology

## 2014-06-04 NOTE — Telephone Encounter (Signed)
Pt called and confirmed appt with Dr. Benay Spice.  Sherrill 07/05/14 1:30pm

## 2014-06-12 ENCOUNTER — Other Ambulatory Visit: Payer: Self-pay | Admitting: Cardiology

## 2014-06-19 ENCOUNTER — Emergency Department (HOSPITAL_COMMUNITY): Payer: Medicare Other

## 2014-06-19 ENCOUNTER — Inpatient Hospital Stay (HOSPITAL_COMMUNITY)
Admission: EM | Admit: 2014-06-19 | Discharge: 2014-06-21 | DRG: 389 | Disposition: A | Discharge status: Home / Self Care | Payer: Medicare Other | Attending: General Surgery | Admitting: General Surgery

## 2014-06-19 ENCOUNTER — Encounter (HOSPITAL_COMMUNITY): Payer: Self-pay | Admitting: Emergency Medicine

## 2014-06-19 DIAGNOSIS — Q231 Congenital insufficiency of aortic valve: Secondary | ICD-10-CM | POA: Diagnosis not present

## 2014-06-19 DIAGNOSIS — Z885 Allergy status to narcotic agent status: Secondary | ICD-10-CM

## 2014-06-19 DIAGNOSIS — K5660 Unspecified intestinal obstruction: Principal | ICD-10-CM | POA: Diagnosis present

## 2014-06-19 DIAGNOSIS — K660 Peritoneal adhesions (postprocedural) (postinfection): Secondary | ICD-10-CM | POA: Diagnosis present

## 2014-06-19 DIAGNOSIS — I10 Essential (primary) hypertension: Secondary | ICD-10-CM | POA: Diagnosis present

## 2014-06-19 DIAGNOSIS — Z880 Allergy status to penicillin: Secondary | ICD-10-CM | POA: Diagnosis not present

## 2014-06-19 DIAGNOSIS — Z87442 Personal history of urinary calculi: Secondary | ICD-10-CM | POA: Diagnosis not present

## 2014-06-19 DIAGNOSIS — I712 Thoracic aortic aneurysm, without rupture: Secondary | ICD-10-CM | POA: Diagnosis present

## 2014-06-19 DIAGNOSIS — M1990 Unspecified osteoarthritis, unspecified site: Secondary | ICD-10-CM | POA: Diagnosis present

## 2014-06-19 DIAGNOSIS — R1030 Lower abdominal pain, unspecified: Secondary | ICD-10-CM | POA: Diagnosis not present

## 2014-06-19 DIAGNOSIS — Z7982 Long term (current) use of aspirin: Secondary | ICD-10-CM

## 2014-06-19 DIAGNOSIS — D649 Anemia, unspecified: Secondary | ICD-10-CM | POA: Diagnosis present

## 2014-06-19 DIAGNOSIS — R109 Unspecified abdominal pain: Secondary | ICD-10-CM | POA: Diagnosis not present

## 2014-06-19 DIAGNOSIS — R112 Nausea with vomiting, unspecified: Secondary | ICD-10-CM | POA: Diagnosis not present

## 2014-06-19 DIAGNOSIS — Z90710 Acquired absence of both cervix and uterus: Secondary | ICD-10-CM

## 2014-06-19 DIAGNOSIS — I2510 Atherosclerotic heart disease of native coronary artery without angina pectoris: Secondary | ICD-10-CM | POA: Diagnosis present

## 2014-06-19 DIAGNOSIS — K56609 Unspecified intestinal obstruction, unspecified as to partial versus complete obstruction: Secondary | ICD-10-CM | POA: Diagnosis present

## 2014-06-19 LAB — URINALYSIS, ROUTINE W REFLEX MICROSCOPIC
BILIRUBIN URINE: NEGATIVE
GLUCOSE, UA: NEGATIVE mg/dL
Hgb urine dipstick: NEGATIVE
Ketones, ur: NEGATIVE mg/dL
LEUKOCYTES UA: NEGATIVE
Nitrite: NEGATIVE
PH: 8 (ref 5.0–8.0)
Protein, ur: NEGATIVE mg/dL
Specific Gravity, Urine: 1.016 (ref 1.005–1.030)
Urobilinogen, UA: 1 mg/dL (ref 0.0–1.0)

## 2014-06-19 LAB — COMPREHENSIVE METABOLIC PANEL
ALK PHOS: 93 U/L (ref 39–117)
ALT: 12 U/L (ref 0–35)
AST: 22 U/L (ref 0–37)
Albumin: 4.2 g/dL (ref 3.5–5.2)
Anion gap: 10 (ref 5–15)
BUN: 13 mg/dL (ref 6–23)
CO2: 27 mmol/L (ref 19–32)
Calcium: 9 mg/dL (ref 8.4–10.5)
Chloride: 103 mmol/L (ref 96–112)
Creatinine, Ser: 0.55 mg/dL (ref 0.50–1.10)
GFR calc non Af Amer: >90 mL/min (ref >90)
GLUCOSE: 132 mg/dL — AB (ref 70–99)
POTASSIUM: 4.2 mmol/L (ref 3.5–5.1)
Sodium: 140 mmol/L (ref 135–145)
Total Bilirubin: 0.8 mg/dL (ref 0.3–1.2)
Total Protein: 6.5 g/dL (ref 6.0–8.3)

## 2014-06-19 LAB — CBC WITH DIFFERENTIAL/PLATELET
Basophils Absolute: 0 10*3/uL (ref 0.0–0.1)
Basophils Relative: 0 % (ref 0–1)
EOS PCT: 1 % (ref 0–5)
Eosinophils Absolute: 0.1 10*3/uL (ref 0.0–0.7)
HCT: 33.7 % — ABNORMAL LOW (ref 36.0–46.0)
Hemoglobin: 10.5 g/dL — ABNORMAL LOW (ref 12.0–15.0)
LYMPHS ABS: 1.8 10*3/uL (ref 0.7–4.0)
LYMPHS PCT: 19 % (ref 12–46)
MCH: 26 pg (ref 26.0–34.0)
MCHC: 31.2 g/dL (ref 30.0–36.0)
MCV: 83.4 fL (ref 78.0–100.0)
MONOS PCT: 12 % (ref 3–12)
Monocytes Absolute: 1.1 10*3/uL — ABNORMAL HIGH (ref 0.1–1.0)
NEUTROS PCT: 68 % (ref 43–77)
Neutro Abs: 6.3 10*3/uL (ref 1.7–7.7)
PLATELETS: 347 10*3/uL (ref 150–400)
RBC: 4.04 MIL/uL (ref 3.87–5.11)
RDW: 14.5 % (ref 11.5–15.5)
WBC: 9.2 10*3/uL (ref 4.0–10.5)

## 2014-06-19 LAB — MAGNESIUM: Magnesium: 1.9 mg/dL (ref 1.5–2.5)

## 2014-06-19 LAB — CREATININE, SERUM: CREATININE: 0.53 mg/dL (ref 0.50–1.10)

## 2014-06-19 LAB — LIPASE, BLOOD: LIPASE: 18 U/L (ref 11–59)

## 2014-06-19 MED ORDER — HEPARIN SODIUM (PORCINE) 5000 UNIT/ML IJ SOLN: 5000.0000 [IU] | Freq: Three times a day (TID) | INTRAMUSCULAR | Status: DC

## 2014-06-19 MED ORDER — KETOROLAC TROMETHAMINE 30 MG/ML IJ SOLN
30.0000 mg | Freq: Once | INTRAMUSCULAR | Status: AC
  Administered 2014-06-19: 30 mg via INTRAVENOUS
  Filled 2014-06-19: qty 1

## 2014-06-19 MED ORDER — SODIUM CHLORIDE 0.9 % IV BOLUS (SEPSIS)
1000.0000 mL | Freq: Once | INTRAVENOUS | Status: AC
  Administered 2014-06-19: 1000 mL via INTRAVENOUS

## 2014-06-19 MED ORDER — PROMETHAZINE HCL 25 MG/ML IJ SOLN
6.2500 mg | Freq: Four times a day (QID) | INTRAMUSCULAR | Status: DC | PRN
  Filled 2014-06-19: qty 1

## 2014-06-19 MED ORDER — FENTANYL CITRATE 0.05 MG/ML IJ SOLN: 12.5000 ug | INTRAMUSCULAR | Status: DC | PRN

## 2014-06-19 MED ORDER — METOPROLOL TARTRATE 1 MG/ML IV SOLN
5.0000 mg | Freq: Four times a day (QID) | INTRAVENOUS | Status: DC
  Administered 2014-06-19 – 2014-06-21 (×3): 5 mg via INTRAVENOUS
  Filled 2014-06-19 (×5): qty 5

## 2014-06-19 MED ORDER — ONDANSETRON HCL 4 MG/2ML IJ SOLN
4.0000 mg | Freq: Four times a day (QID) | INTRAMUSCULAR | Status: DC | PRN
  Administered 2014-06-19: 4 mg via INTRAVENOUS
  Filled 2014-06-19: qty 2

## 2014-06-19 MED ORDER — ONDANSETRON HCL 4 MG/2ML IJ SOLN
4.0000 mg | Freq: Once | INTRAMUSCULAR | Status: AC
  Administered 2014-06-19: 4 mg via INTRAVENOUS
  Filled 2014-06-19: qty 2

## 2014-06-19 MED ORDER — ONDANSETRON 8 MG PO TBDP
8.0000 mg | ORAL_TABLET | Freq: Once | ORAL | Status: AC
  Administered 2014-06-19: 8 mg via ORAL
  Filled 2014-06-19: qty 1

## 2014-06-19 MED ORDER — IOHEXOL 300 MG/ML  SOLN
100.0000 mL | Freq: Once | INTRAMUSCULAR | Status: AC | PRN
  Administered 2014-06-19: 100 mL via INTRAVENOUS

## 2014-06-19 MED ORDER — IOHEXOL 300 MG/ML  SOLN
50.0000 mL | Freq: Once | INTRAMUSCULAR | Status: AC | PRN
  Administered 2014-06-19: 50 mL via ORAL

## 2014-06-19 MED ORDER — ENOXAPARIN SODIUM 40 MG/0.4ML ~~LOC~~ SOLN
40.0000 mg | SUBCUTANEOUS | Status: DC
  Administered 2014-06-19: 40 mg via SUBCUTANEOUS
  Filled 2014-06-19 (×3): qty 0.4

## 2014-06-19 MED ORDER — POTASSIUM CHLORIDE 2 MEQ/ML IV SOLN
INTRAVENOUS | Status: DC
  Administered 2014-06-19 – 2014-06-20 (×2): via INTRAVENOUS
  Filled 2014-06-19 (×6): qty 1000

## 2014-06-19 NOTE — H&P (Signed)
Reason for Consult:  Abdominal pain, nausea and vomiting Referring Physician: Evelina Bucy PCP:  Reginia Naas, MD    Debra [name] is an 64 y.o. female.  HPI: 64 y/o who had a SBR for intermittent SBO on 05/14/14.  She progressed nicely and was discharge on 05/16/14.  Pathology noted on 05/24/14, was a carcinoid.  Now she presents to the ED with cramping, central back pain, nausea and vomiting.  She says she has had some cramping with some nausea on and off for over a week.  She has been able to eat some, but not much and hasn't been hungry.  Yesterday she developed the severe cramping, nausea and vomiting, and came to the ED early this AM. She is afebrile, BP up some on admit.  Labs show some mild anemia but no other changes.  CT scan shows hiatal hernia, Proximal small bowel is dilated until just proximal to the small bowel anastomosis, best seen on image 60 of series 2. The bowel distal to the anastomosis is normal in diameter. Contrast does pass into the nondistended distal small bowel. Terminal ileum and appendix and colon appear normal. Bladder is normal.   We are ask to see.    Past Medical History   Diagnosis  Date   .  Kidney stone     .  Aortic aneurysm     .  Thoracic aortic aneurysm         mild, 4.1 cm by recent CT followed by Dr [name]   .  Coronary artery disease         nonobstructive   .  H/O echocardiogram         bicuspid aortic valve with moderate aortic valve sclerosis,mild PR.TR.MR and mildly dilated aorta by echo on 8.2012   .  Mild mitral regurgitation     .  Hypertension     .  Arthritis         Past Surgical History   Procedure  Laterality  Date   .  Colonoscopy    10/15/11       diverticulosis present, mild severity, no polyps, repeat in 10 years.   .  Abdominal hysterectomy       .  Tonsillectomy       .  Laparoscopic bowel resection    05/14/2014   .  Laparoscopic lysis of adhesions  N/A  05/14/2014       Procedure: LAPAROSCOPIC LYSIS OF  ADHESIONS x 64mns;  Surgeon: BGeorganna Skeans MD;  Location: [facility]  Service: General;  Laterality: N/A;   .  Laparoscopic small bowel resection  N/A  05/14/2014       Procedure: LAPAROSCOPIC ASSISTED SMALL BOWEL RESECTION;  Surgeon: BGeorganna Skeans MD;  Location: [facility];  Service: General;  Laterality: N/A;       Family History   Problem  Relation  Age of Onset   .  CAD  Maternal Grandfather     .  CAD  Paternal Grandmother     .  CAD  Paternal Grandfather       Social History:  reports that she has never smoked. She has never used smokeless tobacco. She reports that she drinks alcohol. She reports that she does not use illicit drugs.  Retired pre school special education teacher/Married  Allergies:   Allergies   Allergen  Reactions   .  Codeine  Anaphylaxis   .  Opium  Anaphylaxis   .  Penicillins  Anaphylaxis  Prior to Admission medications    Medication  Sig  Start Date  End Date  Taking?  Authorizing Provider   aspirin 81 MG tablet  Take 81 mg by mouth daily.      Yes  Historical Provider, MD   metoprolol succinate (TOPROL-XL) 50 MG 24 hr tablet  TAKE 1 TABLET (50 MG TOTAL) BY MOUTH DAILY. TAKE WITH OR IMMEDIATELY FOLLOWING A MEAL.  06/12/14    Yes  Sueanne Margarita, MD   ondansetron (ZOFRAN) 8 MG tablet  Take 8 mg by mouth every 8 (eight) hours as needed for nausea or vomiting (nausea).      Yes  Historical Provider, MD   simvastatin (ZOCOR) 20 MG tablet  Take 1 tablet (20 mg total) by mouth daily.  12/18/13    Yes  Sueanne Margarita, MD   traMADol (ULTRAM) 50 MG tablet  Take 25 mg by mouth every 6 (six) hours as needed for moderate pain. Takes half a tablet      Yes  Historical Provider, MD         Lab Results Last 48 Hours    Results for orders placed or performed during the hospital encounter of 06/19/14 (from the past 48 hour(s))   CBC with Differential     Status: Abnormal     Collection Time: 06/19/14  3:53 AM   Result  Value  Ref Range     WBC  9.2  4.0 - 10.5 K/uL      RBC  4.04  3.87 - 5.11 MIL/uL     Hemoglobin  10.5 (L)  12.0 - 15.0 g/dL     HCT  33.7 (L)  36.0 - 46.0 %     MCV  83.4  78.0 - 100.0 fL     MCH  26.0  26.0 - 34.0 pg     MCHC  31.2  30.0 - 36.0 g/dL     RDW  14.5  11.5 - 15.5 %     Platelets  347  150 - 400 K/uL     Neutrophils Relative %  68  43 - 77 %     Neutro Abs  6.3  1.7 - 7.7 K/uL     Lymphocytes Relative  19  12 - 46 %     Lymphs Abs  1.8  0.7 - 4.0 K/uL     Monocytes Relative  12  3 - 12 %     Monocytes Absolute  1.1 (H)  0.1 - 1.0 K/uL     Eosinophils Relative  1  0 - 5 %     Eosinophils Absolute  0.1  0.0 - 0.7 K/uL     Basophils Relative  0  0 - 1 %     Basophils Absolute  0.0  0.0 - 0.1 K/uL   Comprehensive metabolic panel     Status: Abnormal     Collection Time: 06/19/14  3:53 AM   Result  Value  Ref Range     Sodium  140  135 - 145 mmol/L     Potassium  4.2  3.5 - 5.1 mmol/L     Chloride  103  96 - 112 mmol/L     CO2  27  19 - 32 mmol/L     Glucose, Bld  132 (H)  70 - 99 mg/dL     BUN  13  6 - 23 mg/dL     Creatinine, Ser  0.55  0.50 - 1.10  mg/dL     Calcium  9.0  8.4 - 10.5 mg/dL     Total Protein  6.5  6.0 - 8.3 g/dL     Albumin  4.2  3.5 - 5.2 g/dL     AST  22  0 - 37 U/L     ALT  12  0 - 35 U/L     Alkaline Phosphatase  93  39 - 117 U/L     Total Bilirubin  0.8  0.3 - 1.2 mg/dL     GFR calc non Af Amer  >90  >90 mL/min     GFR calc Af Amer  >90  >90 mL/min       Comment:  (NOTE)  The eGFR has been calculated using the CKD EPI equation. This calculation has not been validated in all clinical situations. eGFR's persistently <90 mL/min signify possible Chronic Kidney Disease.      Anion gap  10  5 - 15   Lipase, blood     Status: None     Collection Time: 06/19/14  3:53 AM   Result  Value  Ref Range     Lipase  18  11 - 59 U/L   Urinalysis, Routine w reflex microscopic     Status: Abnormal     Collection Time: 06/19/14  6:38 AM   Result  Value  Ref Range     Color, Urine  YELLOW  YELLOW      APPearance  CLOUDY (A)  CLEAR     Specific Gravity, Urine  1.016  1.005 - 1.030     pH  8.0  5.0 - 8.0     Glucose, UA  NEGATIVE  NEGATIVE mg/dL     Hgb urine dipstick  NEGATIVE  NEGATIVE     Bilirubin Urine  NEGATIVE  NEGATIVE     Ketones, ur  NEGATIVE  NEGATIVE mg/dL     Protein, ur  NEGATIVE  NEGATIVE mg/dL     Urobilinogen, UA  1.0  0.0 - 1.0 mg/dL     Nitrite  NEGATIVE  NEGATIVE     Leukocytes, UA  NEGATIVE  NEGATIVE       Comment:  MICROSCOPIC NOT DONE ON URINES WITH NEGATIVE PROTEIN, BLOOD, LEUKOCYTES, NITRITE, OR GLUCOSE <1000 mg/dL.        Imaging Results (Last 48 hours)    Ct Abdomen Pelvis W Contrast  06/19/2014   CLINICAL DATA:  Lower abdominal pain and cramping. Nausea and vomiting. Small bowel resection on 05/14/2014.  EXAM: CT ABDOMEN AND PELVIS WITH CONTRAST  TECHNIQUE: Multidetector CT imaging of the abdomen and pelvis was performed using the standard protocol following bolus administration of intravenous contrast.  CONTRAST:  186m OMNIPAQUE IOHEXOL 300 MG/ML  SOLN  COMPARISON:  CT scan dated 09/27/2013  FINDINGS: There is a small hiatal hernia. Lung bases are clear. Liver, spleen, pancreas, adrenal glands, and kidneys are normal. There is a 4 mm polyp in the otherwise normal appearing gallbladder. No dilated bile ducts.  No free air in the abdomen. Tiny amount of free fluid in the pelvis. Uterus has been removed. Ovaries are normal.  Proximal small bowel is dilated until just proximal to the small bowel anastomosis, best seen on image 60 of series 2. The bowel distal to the anastomosis is normal in diameter. Contrast does pass into the nondistended distal small bowel. Terminal ileum and appendix and colon appear normal. Bladder is normal. No significant osseous  No acute osseous abnormality. Severe facet  arthritis at L4-5 with grade 1 spondylolisthesis and moderately severe spinal stenosis.  IMPRESSION: Findings consistent with partial small bowel obstruction.   Electronically  Signed   By: Lorriane Shire M.D.   On: 06/19/2014 09:29      Review of Systems  Constitutional: Positive for fever (she felt warm, but did not take temp.) and weight loss (few pounds since surgery, but has not kept track). Negative for chills.  HENT: Negative.   Eyes: Negative.   Respiratory: Negative.   Cardiovascular: Negative.   Gastrointestinal: Positive for nausea, vomiting and abdominal pain. Negative for heartburn, diarrhea, constipation, blood in stool and melena.  Genitourinary: Negative.   Musculoskeletal: Positive for back pain.  Skin: Negative.   Neurological: Negative.   Endo/Heme/Allergies: Negative for environmental allergies. Does not bruise/bleed easily.  Psychiatric/Behavioral: Negative.    Blood pressure 129/76, pulse 84, temperature 98.2 F (36.8 C), temperature source Oral, resp. rate 18, SpO2 96 %. Physical Exam  Constitutional: She is oriented to person, place, and time. She appears well-developed and well-nourished. No distress.  Currently no nausea.  HENT:   Head: Normocephalic and atraumatic.   Nose: Nose normal.  Eyes: Conjunctivae and EOM are normal. Right eye exhibits no discharge. Left eye exhibits no discharge. No scleral icterus.  Neck: No JVD present. No tracheal deviation present. No thyromegaly present.  Cardiovascular: Normal rate, regular rhythm and intact distal pulses.  Exam reveals no gallop.    Murmur (soft stystolic mumur aortic area.  ) heard. Respiratory: Effort normal and breath sounds normal. No respiratory distress. She has no wheezes. She has no rales. She exhibits no tenderness.  GI: Soft. She exhibits distension. She exhibits no mass. There is tenderness. There is no rebound and no guarding.  Bowel sounds hyperactive Well healing midline abdominal incision.  Musculoskeletal: Normal range of motion. She exhibits no edema.  Lymphadenopathy:    She has no cervical adenopathy.  Neurological: She is alert and oriented to person, place,  and time. No cranial nerve deficit.  Skin: Skin is warm and dry. No rash noted. She is not diaphoretic. No erythema. No pallor.  Psychiatric: She has a normal mood and affect. Her behavior is normal. Judgment and thought content normal.  Pathology from 05/14/14: Small intestine, resection - CARCINOID TUMORS (2), SPANNING 1.4 CM AND 1.1 CM. - LYMPHOVASCULAR INVASION IS IDENTIFIED. - THE SURGICAL RESECTION MARGINS ARE NEGATIVE FOR TUMOR.   Assessment/Plan: 1.  SBO s/p LAPAROSCOPIC LYSIS OF ADHESIONS x 34mns, LAPAROSCOPIC ASSISTED SMALL BOWEL RESECTION, 05/14/14 Dr. BGeorganna Skeans 2. 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 SBenay Spice 3.  Bicuspid aortic valve, with mild PR, TR, and MR 4.  Thoracic aortic aneurysm 5. Hypertension 6.  CAD non obstructive  Plan:  Hydrate, bowel rest, if she vomits again insert an NG. Watch and see if she clears this.  Recheck film in AM.    Debra [name] 06/19/2014, 10:57 AM

## 2014-06-19 NOTE — ED Notes (Signed)
Bed: WA17 Expected date:  Expected time:  Means of arrival:  Comments: No monitor.

## 2014-06-19 NOTE — ED Notes (Signed)
Patient s/p small bowel resection with adhesions surgery on May 14, 2014, today c/o bilateral lower abd pain, described as pain with waves of intense cramping, central thoracic back pain, with nausea & vomiting. Vomiting 5-6 episodes in past 4-5 hours. Patient spoke to Dr Biagio Borg nurse @ 1 week ago, was told she had likely "over exerted", patient states pain has continued and is now worse.

## 2014-06-19 NOTE — Progress Notes (Signed)
Pt admitted to [facility] from [facility] via [place].. Pt is alert and oriented x4, able to follow commands, denies any pain at this time. VSS, no s/s of respiratory distress on roomair. Standard hospital orientation completed, bed in lowest position, call bell is within reach. Pt's spouse is bedside. Will continue to monitor pt closely. Report received from [facility], [place].

## 2014-06-19 NOTE — ED Notes (Signed)
Per Will PA, Pt will be admitted to [facility]

## 2014-06-19 NOTE — ED Provider Notes (Signed)
CSN: [id number]     Arrival date & time 06/19/14  0224 History   First MD Initiated Contact with Patient 06/19/14 [id number]     Chief Complaint  Patient presents with  . Abdominal Pain    lower abd with back pain, nausea and vomiting     (Consider location/radiation/quality/duration/timing/severity/associated sxs/prior Treatment) HPI Comments: Patient here with abdominal pain and back pain. Recent small bowel resection 4-5 weeks ago.  Patient is a 64 y.o. female presenting with abdominal pain. The history is provided by the patient.  Abdominal Pain Pain location:  Suprapubic Pain quality: not aching, not burning, not cramping, not dull, no fullness, not sharp and not tearing   Pain radiates to:  Does not radiate Pain severity:  Mild Onset quality:  Gradual Timing:  Constant Progression:  Unchanged Chronicity:  New Context: previous surgery (recent small bowel resection)   Context: not alcohol use, not diet changes, not eating, not recent illness and not recent sexual activity   Relieved by:  Nothing Worsened by:  Nothing tried Associated symptoms: fever (past 2 days) and nausea   Associated symptoms: no chest pain, no chills, no cough, no diarrhea, no shortness of breath and no vomiting     Past Medical History  Diagnosis Date  . Kidney stone   . Aortic aneurysm   . Thoracic aortic aneurysm     mild, 4.1 cm by recent CT followed by Dr [name]  . Coronary artery disease     nonobstructive  . H/O echocardiogram     bicuspid aortic valve with moderate aortic valve sclerosis,mild PR.TR.MR and mildly dilated aorta by echo on 8.2012  . Mild mitral regurgitation   . Hypertension   . Arthritis    Past Surgical History  Procedure Laterality Date  . Colonoscopy  10/15/11    diverticulosis present, mild severity, no polyps, repeat in 10 years.  . Abdominal hysterectomy    . Tonsillectomy    . Laparoscopic bowel resection  05/14/2014  . Laparoscopic lysis of adhesions N/A 05/14/2014     Procedure: LAPAROSCOPIC LYSIS OF ADHESIONS x 61mins;  Surgeon: [name], MD;  Location: [place];  Service: General;  Laterality: N/A;  . Laparoscopic small bowel resection N/A 05/14/2014    Procedure: LAPAROSCOPIC ASSISTED SMALL BOWEL RESECTION;  Surgeon: [name], MD;  Location: [facility];  Service: General;  Laterality: N/A;   Family History  Problem Relation Age of Onset  . CAD Maternal Grandfather   . CAD Paternal Grandmother   . CAD Paternal Grandfather    History  Substance Use Topics  . Smoking status: Never Smoker   . Smokeless tobacco: Never Used  . Alcohol Use: Yes     Comment: wine occasionally   OB History    No data available     Review of Systems  Constitutional: Positive for fever (past 2 days). Negative for chills.  Respiratory: Negative for cough and shortness of breath.   Cardiovascular: Negative for chest pain and leg swelling.  Gastrointestinal: Positive for nausea and abdominal pain. Negative for vomiting and diarrhea.  Musculoskeletal: Positive for back pain.  All other systems reviewed and are negative.     Allergies  Codeine; Opium; and Penicillins  Home Medications   Prior to Admission medications   Medication Sig Start Date End Date Taking? Authorizing Provider  aspirin 81 MG tablet Take 81 mg by mouth daily.   Yes Historical Provider, MD  metoprolol succinate (TOPROL-XL) 50 MG 24 hr tablet TAKE 1 TABLET (50  MG TOTAL) BY MOUTH DAILY. TAKE WITH OR IMMEDIATELY FOLLOWING A MEAL. 06/12/14  Yes Sueanne Margarita, MD  ondansetron (ZOFRAN) 8 MG tablet Take 8 mg by mouth every 8 (eight) hours as needed for nausea or vomiting (nausea).   Yes Historical Provider, MD  simvastatin (ZOCOR) 20 MG tablet Take 1 tablet (20 mg total) by mouth daily. 12/18/13  Yes Sueanne Margarita, MD  traMADol (ULTRAM) 50 MG tablet Take 25 mg by mouth every 6 (six) hours as needed for moderate pain. Takes half a tablet   Yes Historical Provider, MD   BP 126/68 mmHg  Pulse 72   Temp(Src) 98.3 F (36.8 C) (Oral)  Resp 18  Ht 5\' 2"  (1.575 m)  Wt 117 lb (53.071 kg)  BMI 21.39 kg/m2  SpO2 95% Physical Exam  Constitutional: She is oriented to person, place, and time. She appears well-developed and well-nourished. No distress.  HENT:  Head: Normocephalic and atraumatic.  Mouth/Throat: Oropharynx is clear and moist.  Eyes: EOM are normal. Pupils are equal, round, and reactive to light.  Neck: Normal range of motion. Neck supple.  Cardiovascular: Normal rate and regular rhythm.  Exam reveals no friction rub.   No murmur heard. Pulmonary/Chest: Effort normal and breath sounds normal. No respiratory distress. She has no wheezes. She has no rales.  Abdominal: Soft. She exhibits no distension. There is tenderness (lower abdominal pain). There is no rebound.  Musculoskeletal: Normal range of motion. She exhibits no edema.  Neurological: She is alert and oriented to person, place, and time. No cranial nerve deficit. She exhibits normal muscle tone.  Skin: No rash noted. She is not diaphoretic.  Nursing note and vitals reviewed.   ED Course  Procedures (including critical care time) Labs Review Labs Reviewed  CBC WITH DIFFERENTIAL/PLATELET - Abnormal; Notable for the following:    Hemoglobin 10.5 (*)    HCT 33.7 (*)    Monocytes Absolute 1.1 (*)    All other components within normal limits  COMPREHENSIVE METABOLIC PANEL - Abnormal; Notable for the following:    Glucose, Bld 132 (*)    All other components within normal limits  URINALYSIS, ROUTINE W REFLEX MICROSCOPIC - Abnormal; Notable for the following:    APPearance CLOUDY (*)    All other components within normal limits  LIPASE, BLOOD  CREATININE, SERUM  MAGNESIUM  BASIC METABOLIC PANEL  CBC    Imaging Review Ct Abdomen Pelvis W Contrast  06/19/2014   CLINICAL DATA:  Lower abdominal pain and cramping. Nausea and vomiting. Small bowel resection on 05/14/2014.  EXAM: CT ABDOMEN AND PELVIS WITH CONTRAST   TECHNIQUE: Multidetector CT imaging of the abdomen and pelvis was performed using the standard protocol following bolus administration of intravenous contrast.  CONTRAST:  118mL OMNIPAQUE IOHEXOL 300 MG/ML  SOLN  COMPARISON:  CT scan dated 09/27/2013  FINDINGS: There is a small hiatal hernia. Lung bases are clear. Liver, spleen, pancreas, adrenal glands, and kidneys are normal. There is a 4 mm polyp in the otherwise normal appearing gallbladder. No dilated bile ducts.  No free air in the abdomen. Tiny amount of free fluid in the pelvis. Uterus has been removed. Ovaries are normal.  Proximal small bowel is dilated until just proximal to the small bowel anastomosis, best seen on image 60 of series 2. The bowel distal to the anastomosis is normal in diameter. Contrast does pass into the nondistended distal small bowel. Terminal ileum and appendix and colon appear normal. Bladder is normal. No significant  osseous  No acute osseous abnormality. Severe facet arthritis at L4-5 with grade 1 spondylolisthesis and moderately severe spinal stenosis.  IMPRESSION: Findings consistent with partial small bowel obstruction.   Electronically Signed   By: Lorriane Shire M.D.   On: 06/19/2014 09:29     EKG Interpretation None      MDM   Final diagnoses:  Abdominal pain    63 year old female here a days of abdominal pain. Associated fever for the past 2 days. Worsening pain. 6 weeks ago had small bowel resection due to some small carcinoid tumors. Has been doing well postoperatively otherwise. Here has lower abdominal pain. Surgical incisions are well healing and without signs of infection. Plan for repeat CT scan to look for possible source of her fever. Dr. Roderic Palau to f/u on CT results - which ended up showing PSBO.  Evelina Bucy, MD 06/19/14 Drema Halon

## 2014-06-19 NOTE — Progress Notes (Signed)
ANTICOAGULATION CONSULT NOTE - Initial Consult  Pharmacy Consult for Lovenox Indication: VTE prophylaxis  Allergies  Allergen Reactions  . Codeine Anaphylaxis  . Opium Anaphylaxis  . Penicillins Anaphylaxis    Patient Measurements:    Last weight 54.4 kg on 05/14/14 Ht. 5\' 2"   Vital Signs: Temp: 98.2 F (36.8 C) (04/12 0240) Temp Source: Oral (04/12 0240) BP: 122/63 mmHg (04/12 1143) Pulse Rate: 77 (04/12 1143)  Labs:  Recent Labs  06/19/14 0353  HGB 10.5*  HCT 33.7*  PLT 347  CREATININE 0.55    CrCl cannot be calculated (Unknown ideal weight.).   Medical History: Past Medical History  Diagnosis Date  . Kidney stone   . Aortic aneurysm   . Thoracic aortic aneurysm     mild, 4.1 cm by recent CT followed by Dr [name]  . Coronary artery disease     nonobstructive  . H/O echocardiogram     bicuspid aortic valve with moderate aortic valve sclerosis,mild PR.TR.MR and mildly dilated aorta by echo on 8.2012  . Mild mitral regurgitation   . Hypertension   . Arthritis     Medications:   (Not in a hospital admission) Scheduled:  . metoprolol  5 mg Intravenous 4 times per day   Infusions:   PRN: fentaNYL, ondansetron, promethazine  Assessment: 63yoF with recent small bowel resection on 05/14/14 to relieve carcinoid SBO presents with cramping, back pain, and N/V.  Minimal PO intake.  Pharmacy consulted to dose Lovenox for VTE prophylaxis.  PMH notable for mild thoracic aortic aneurysm with f/u by Dr. Nils Pyle, and some degree of chronic aortic valve disease.  Today, 06/19/2014:  Hgb low but appears to be stable since surgery in March, Plt wnl  SCr 0.55; CrCl 55 using SCr 0.8 (44 ml/min using SCr 1.0)   Goal of Therapy:  Prevention of VTE Monitor platelets by anticoagulation protocol: Yes   Plan:   Lovenox 40 mg SQ q24 hr starting tonight at 22:00  CBC/SCr at least q72 while on Lovenox; f/u Hgb  Monitor for signs of bleeding  Pharmacy will  follow peripherally  Reuel Boom, PharmD Pager: [phone] 06/19/2014, 12:50 PM

## 2014-06-20 ENCOUNTER — Inpatient Hospital Stay (HOSPITAL_COMMUNITY): Payer: Medicare Other

## 2014-06-20 LAB — CBC
HCT: 26.5 % — ABNORMAL LOW (ref 36.0–46.0)
HEMOGLOBIN: 8.3 g/dL — AB (ref 12.0–15.0)
MCH: 26.2 pg (ref 26.0–34.0)
MCHC: 31.3 g/dL (ref 30.0–36.0)
MCV: 83.6 fL (ref 78.0–100.0)
PLATELETS: 236 10*3/uL (ref 150–400)
RBC: 3.17 MIL/uL — AB (ref 3.87–5.11)
RDW: 14.4 % (ref 11.5–15.5)
WBC: 6.1 10*3/uL (ref 4.0–10.5)

## 2014-06-20 LAB — BASIC METABOLIC PANEL
Anion gap: 6 (ref 5–15)
BUN: 7 mg/dL (ref 6–23)
CALCIUM: 8.4 mg/dL (ref 8.4–10.5)
CO2: 25 mmol/L (ref 19–32)
CREATININE: 0.67 mg/dL (ref 0.50–1.10)
Chloride: 110 mmol/L (ref 96–112)
GFR calc Af Amer: >90 mL/min (ref >90)
GFR calc non Af Amer: >90 mL/min (ref >90)
GLUCOSE: 88 mg/dL (ref 70–99)
Potassium: 4.1 mmol/L (ref 3.5–5.1)
Sodium: 141 mmol/L (ref 135–145)

## 2014-06-20 NOTE — Progress Notes (Signed)
UR completed.  Mataeo Ingwersen, RN BSN [facility] CCM Trauma/Neuro ICU Case Manager [phone]  

## 2014-06-20 NOTE — Progress Notes (Signed)
  Subjective: Feels a lot better but no BM. Did not need pain or nausea medicine overnight. No abdominal pain now.  Objective: Vital signs in last 24 hours: Temp:  [98.2 F (36.8 C)-98.7 F (37.1 C)] 98.4 F (36.9 C) (04/13 0529) Pulse Rate:  [67-78] 70 (04/13 0529) Resp:  [16-18] 16 (04/13 0529) BP: (100-141)/(52-76) 121/61 mmHg (04/13 0529) SpO2:  [95 %-100 %] 100 % (04/13 0529) Weight:  [53.071 kg (117 lb)] 53.071 kg (117 lb) (04/12 1523) Last BM Date: 06/17/14  Intake/Output from previous day: 04/12 0701 - 04/13 0700 In: 75 [I.V.:75] Out: -  Intake/Output this shift:    General appearance: alert and cooperative Resp: clear to auscultation bilaterally Cardio: regular rate and rhythm GI: soft, NT, very active BS  Lab Results:   Recent Labs  06/19/14 0353 06/20/14 0409  WBC 9.2 6.1  HGB 10.5* 8.3*  HCT 33.7* 26.5*  PLT 347 236   BMET  Recent Labs  06/19/14 0353 06/19/14 1226 06/20/14 0409  NA 140  --  141  K 4.2  --  4.1  CL 103  --  110  CO2 27  --  25  GLUCOSE 132*  --  88  BUN 13  --  7  CREATININE 0.55 0.53 0.67  CALCIUM 9.0  --  8.4   PT/INR No results for input(s): LABPROT, INR in the last 72 hours. ABG No results for input(s): PHART, HCO3 in the last 72 hours.  Invalid input(s): PCO2, PO2  Studies/Results: Ct Abdomen Pelvis W Contrast  06/19/2014   CLINICAL DATA:  Lower abdominal pain and cramping. Nausea and vomiting. Small bowel resection on 05/14/2014.  EXAM: CT ABDOMEN AND PELVIS WITH CONTRAST  TECHNIQUE: Multidetector CT imaging of the abdomen and pelvis was performed using the standard protocol following bolus administration of intravenous contrast.  CONTRAST:  15mL OMNIPAQUE IOHEXOL 300 MG/ML  SOLN  COMPARISON:  CT scan dated 09/27/2013  FINDINGS: There is a small hiatal hernia. Lung bases are clear. Liver, spleen, pancreas, adrenal glands, and kidneys are normal. There is a 4 mm polyp in the otherwise normal appearing gallbladder. No  dilated bile ducts.  No free air in the abdomen. Tiny amount of free fluid in the pelvis. Uterus has been removed. Ovaries are normal.  Proximal small bowel is dilated until just proximal to the small bowel anastomosis, best seen on image 60 of series 2. The bowel distal to the anastomosis is normal in diameter. Contrast does pass into the nondistended distal small bowel. Terminal ileum and appendix and colon appear normal. Bladder is normal. No significant osseous  No acute osseous abnormality. Severe facet arthritis at L4-5 with grade 1 spondylolisthesis and moderately severe spinal stenosis.  IMPRESSION: Findings consistent with partial small bowel obstruction.   Electronically Signed   By: Lorriane Shire M.D.   On: 06/19/2014 09:29    Anti-infectives: Anti-infectives    None      Assessment/Plan: PSBO - x-rays P this AM, try clears Anemia - chronic plus dilutional, decrease IVF, check in AM VTE - Lovenox  LOS: 1 day    Shawnita Krizek E 06/20/2014

## 2014-06-21 NOTE — Progress Notes (Signed)
NURSING PROGRESS NOTE  BRITZY GRAUL [id number] Discharge Data: 06/21/2014 11:52 AM Attending Provider: [name], MD RFF:[id number] Weyman Croon, MD     Suella [place] to be D/C'd Home per MD order.  Discussed with the patient the After Visit Summary and all questions fully answered. All IV's discontinued with no bleeding noted. All belongings returned to patient for patient to take home.   Last Vital Signs:  Blood pressure 136/63, pulse 84, temperature 98.4 F (36.9 C), temperature source Oral, resp. rate 18, height 5\' 2"  (1.575 m), weight 53.071 kg (117 lb), SpO2 100 %.  Discharge Medication List   Medication List    TAKE these medications        aspirin 81 MG tablet  Take 81 mg by mouth daily.     metoprolol succinate 50 MG 24 hr tablet  Commonly known as:  TOPROL-XL  TAKE 1 TABLET (50 MG TOTAL) BY MOUTH DAILY. TAKE WITH OR IMMEDIATELY FOLLOWING A MEAL.     ondansetron 8 MG tablet  Commonly known as:  ZOFRAN  Take 8 mg by mouth every 8 (eight) hours as needed for nausea or vomiting (nausea).     simvastatin 20 MG tablet  Commonly known as:  ZOCOR  Take 1 tablet (20 mg total) by mouth daily.     traMADol 50 MG tablet  Commonly known as:  ULTRAM  Take 25 mg by mouth every 6 (six) hours as needed for moderate pain. Takes half a tablet

## 2014-06-21 NOTE — Discharge Summary (Signed)
Physician Discharge Summary  Patient ID: Debra [name] MRN: [id number] DOB/AGE: 66-Jan-1952 66 y.o.  Admit date: 06/19/2014 Discharge date: 06/21/2014  Admission Diagnoses: Partial small bowel obstruction  Discharge Diagnoses: Small bowel obstruction resolved Active Problems:   SBO (small bowel obstruction)   Discharged Condition: good  Hospital Course: Debra [name] underwent laparoscopic small bowel resection for obstructing carcinoid tumor last month. She presented with a partial small bowel obstruction. This was treated with supportive care and IV fluids. X-rays the following morning demonstrated resolution of obstruction with contrast filling her colon. She tolerated gradual advancement of her diet and had 2 bowel movements yesterday. She has required no pain medicine nor antibiotics. She is discharged in stable condition.  Consults: None  Significant Diagnostic Studies: CT and abdominal films  Treatments: IV hydration  Discharge Exam: Blood pressure 136/63, pulse 84, temperature 98.4 F (36.9 C), temperature source Oral, resp. rate 18, height 5\' 2"  (1.575 m), weight 53.071 kg (117 lb), SpO2 100 %. General appearance: alert and cooperative Resp: clear to auscultation bilaterally Cardio: regular rate and rhythm GI: Soft, nontender, nondistended, active bowel sounds  Disposition: 01-Home or Self Care  Discharge Instructions    Discharge instructions    Complete by:  As directed   Avoid large amounts of fiber. Some cramping and loose stools should be expected over the next several days.     Increase activity slowly    Complete by:  As directed      No wound care    Complete by:  As directed             Medication List    TAKE these medications        aspirin 81 MG tablet  Take 81 mg by mouth daily.     metoprolol succinate 50 MG 24 hr tablet  Commonly known as:  TOPROL-XL  TAKE 1 TABLET (50 MG TOTAL) BY MOUTH DAILY. TAKE WITH OR IMMEDIATELY FOLLOWING A MEAL.      ondansetron 8 MG tablet  Commonly known as:  ZOFRAN  Take 8 mg by mouth every 8 (eight) hours as needed for nausea or vomiting (nausea).     simvastatin 20 MG tablet  Commonly known as:  ZOCOR  Take 1 tablet (20 mg total) by mouth daily.     traMADol 50 MG tablet  Commonly known as:  ULTRAM  Take 25 mg by mouth every 6 (six) hours as needed for moderate pain. Takes half a tablet           Follow-up Information    Follow up with Zenovia Jarred, MD.   Specialty:  General Surgery   Why:  My office will call with an appointment time   Contact information:   [place] [address] [phone]       Signed: Zenovia Jarred 06/21/2014, 8:20 AM

## 2014-06-26 ENCOUNTER — Ambulatory Visit: Payer: Medicare Other | Admitting: Oncology

## 2014-06-26 ENCOUNTER — Ambulatory Visit: Payer: Medicare Other

## 2014-06-26 ENCOUNTER — Other Ambulatory Visit: Payer: Medicare Other

## 2014-07-04 ENCOUNTER — Encounter: Payer: Self-pay | Admitting: *Deleted

## 2014-07-05 ENCOUNTER — Encounter: Payer: Self-pay | Admitting: Oncology

## 2014-07-05 ENCOUNTER — Ambulatory Visit: Payer: Medicare Other

## 2014-07-05 ENCOUNTER — Other Ambulatory Visit: Payer: Self-pay | Admitting: *Deleted

## 2014-07-05 ENCOUNTER — Other Ambulatory Visit (HOSPITAL_BASED_OUTPATIENT_CLINIC_OR_DEPARTMENT_OTHER): Payer: Medicare Other

## 2014-07-05 ENCOUNTER — Other Ambulatory Visit: Payer: Medicare Other

## 2014-07-05 ENCOUNTER — Telehealth: Payer: Self-pay | Admitting: Oncology

## 2014-07-05 ENCOUNTER — Ambulatory Visit (HOSPITAL_BASED_OUTPATIENT_CLINIC_OR_DEPARTMENT_OTHER): Payer: Medicare Other | Admitting: Oncology

## 2014-07-05 VITALS — BP 132/75 | HR 80 | Temp 98.1°F | Resp 18 | Ht 62.0 in | Wt 114.8 lb

## 2014-07-05 DIAGNOSIS — C7A012 Malignant carcinoid tumor of the ileum: Secondary | ICD-10-CM

## 2014-07-05 DIAGNOSIS — I712 Thoracic aortic aneurysm, without rupture: Secondary | ICD-10-CM | POA: Diagnosis not present

## 2014-07-05 DIAGNOSIS — D509 Iron deficiency anemia, unspecified: Secondary | ICD-10-CM

## 2014-07-05 DIAGNOSIS — D649 Anemia, unspecified: Secondary | ICD-10-CM

## 2014-07-05 DIAGNOSIS — C7A019 Malignant carcinoid tumor of the small intestine, unspecified portion: Secondary | ICD-10-CM

## 2014-07-05 LAB — CBC WITH DIFFERENTIAL/PLATELET
BASO%: 0.5 % (ref 0.0–2.0)
Basophils Absolute: 0 10*3/uL (ref 0.0–0.1)
EOS%: 0.7 % (ref 0.0–7.0)
Eosinophils Absolute: 0.1 10*3/uL (ref 0.0–0.5)
HEMATOCRIT: 32.2 % — AB (ref 34.8–46.6)
HGB: 9.8 g/dL — ABNORMAL LOW (ref 11.6–15.9)
LYMPH%: 30.5 % (ref 14.0–49.7)
MCH: 24.1 pg — ABNORMAL LOW (ref 25.1–34.0)
MCHC: 30.6 g/dL — ABNORMAL LOW (ref 31.5–36.0)
MCV: 78.7 fL — AB (ref 79.5–101.0)
MONO#: 0.8 10*3/uL (ref 0.1–0.9)
MONO%: 10.4 % (ref 0.0–14.0)
NEUT#: 4.2 10*3/uL (ref 1.5–6.5)
NEUT%: 57.9 % (ref 38.4–76.8)
PLATELETS: 276 10*3/uL (ref 145–400)
RBC: 4.09 10*6/uL (ref 3.70–5.45)
RDW: 15.4 % — ABNORMAL HIGH (ref 11.2–14.5)
WBC: 7.3 10*3/uL (ref 3.9–10.3)
lymph#: 2.2 10*3/uL (ref 0.9–3.3)

## 2014-07-05 NOTE — Progress Notes (Signed)
Checked in new pt with no financial concerns at this time.  Pt has 2 insurances so financial assistance may not be needed but she has my card for any billing questions or concerns.  ° °

## 2014-07-05 NOTE — Progress Notes (Signed)
Debra [name] Consult   Referring MD: Lourie Retz 64 y.o.  [birth year]    Reason for Referral: Carcinoid tumor   HPI: Mrs. Debra [name] reports episodes of intermittent abdominal pain and nausea/vomiting beginning in approximately August 2014. She was evaluated by multiple physicians with no apparent etiology for these episodes. She was seen in the emergency room in May 2015. A CT 08/01/2013 revealed a segment of small bowel with mural thickening in the right abdomen. The pain was intermittent and at times occurred months apart. She was referred to Dr. [name] secondary to the persistent pain and possibility of adhesions. She was taken to the operating room on 05/14/2014 for a laparoscopic lysis of adhesions and laparoscopic assisted small bowel resection. The small bowel was not involved with adhesions. Omental adhesions were noted along the midline and into the pelvis. An area of obstruction was identified in the proximal ileum. This area of bowel was resected. A palpable mass was noted at the strictured area with a second small palpable mass 27 m away. Both were less than a centimeter in size. This appeared to be a area of chronic partial obstruction. The pathology ([id number]) revealed to carcinoid tumors measuring 1.4 and 1.1 cm. Lymphovascular invasion was identified. The surgical margins are negative. No macroscopic tumor perforation. Carcinoid tumor cells are focally present at the serosal surface of the larger tumor.  She was readmitted 06/19/2014 with nausea/vomiting and abdominal pain. She was diagnosed with a small bowel obstruction. This resolved with conservative measures.  She now feels well. She had a single episode of nausea/vomiting on 07/02/2014 after eating a large meal.  Past Medical History  Diagnosis Date  . Kidney stone   . Aortic aneurysm   . Thoracic aortic aneurysm     mild, 4.1 cm by recent CT followed by Dr  [name]  . Coronary artery disease     nonobstructive  . H/O echocardiogram     bicuspid aortic valve with moderate aortic valve sclerosis,mild PR.TR.MR and mildly dilated aorta by echo on 8.2012  . Mild mitral regurgitation   . Hypertension   . Arthritis     .  G2 P2  Past Surgical History  Procedure Laterality Date  . Colonoscopy  10/15/11    diverticulosis present, mild severity, no polyps, repeat in 10 years.  . Abdominal hysterectomy    . Tonsillectomy    . Laparoscopic bowel resection  05/14/2014  . Laparoscopic lysis of adhesions N/A 05/14/2014    Procedure: LAPAROSCOPIC LYSIS OF ADHESIONS x 93mns;  Surgeon: BGeorganna Skeans MD;  Location[place] [facility]  Service: General;  Laterality: N/A;  . Laparoscopic small bowel resection N/A 05/14/2014    Procedure: LAPAROSCOPIC ASSISTED SMALL BOWEL RESECTION;  Surgeon: BGeorganna Skeans MD;  Location[place] [facility]  Service: General;  Laterality: N/A;    Medications: Reviewed  Allergies:  Allergies  Allergen Reactions  . Codeine Anaphylaxis  . Opium Anaphylaxis  . Penicillins Anaphylaxis    Family history: Her mother had breast cancer. Her father had multiple myeloma.  Social History:   She lives with her husband and [place] She is retired sChief Technology Officer She does not use tobacco. She drinks wine. No risk factor for HIV or hepatitis.  History  Alcohol Use  . Yes    Comment: wine occasionally    History  Smoking status  . Never Smoker   Smokeless tobacco  . Never Used      ROS:  Positives include: 12 pound weight loss following the small bowel resection, single episode of nausea/vomiting and abdominal pain for 25 2016  A complete ROS was otherwise negative.  Physical Exam:  Blood pressure 132/75, pulse 80, temperature 98.1 F (36.7 C), temperature source Oral, resp. rate 18, height 5' 2"  (1.575 m), weight 114 lb 12.8 oz (52.073 kg), SpO2 100 %.  HEENT: Oropharynx without visible mass, neck without mass Lungs:  Clear bilaterally Cardiac: Regular rate and rhythm Abdomen: No hepatosplenomegaly, nontender, no mass, healed surgical incisions  Vascular: No leg edema Lymph nodes: No cervical, supra-clavicular, axillary, or inguinal nodes Neurologic: Alert and oriented, the motor exam appears intact in the upper and lower extremities Skin: No rash Musculoskeletal: No spine tenderness   LAB:  CBC  Lab Results  Component Value Date   WBC 7.3 07/05/2014   HGB 9.8* 07/05/2014   HCT 32.2* 07/05/2014   MCV 78.7* 07/05/2014   PLT 276 07/05/2014   NEUTROABS 4.2 07/05/2014    Chromogranin A level on 05/30/2014-14   Imaging:  As per history of present illness, CT 06/19/2014 revealed a small bowel obstruction   Assessment/Plan:   1. Carcinoid tumors involving the the ileum, status post a small bowel resection 05/14/2014 with 1.4 cm and 1.1 cm tumors noted,T4Nx  The larger tumor showed focal involvement of the serosal surface, resection margins negative, lymphovascular invasion present 2. History of intermittent muscle pain and nausea/vomiting secondary to bowel obstruction from #1  3.   Microcytic anemia-the hemoglobin has improved from discharge, she may have iron deficiency  4.   Thoracic Aortic aneurysm  5.   Bicuspid aortic valve   Disposition:   [name] has been diagnosed with carcinoid tumors involving the ileum. She underwent a partial small bowel resection. I reviewed the pathology report and prognosis with her. She has no symptoms to suggest carcinoid syndrome. She has a good prognosis for a long-term disease-free survival. There is no indication for adjuvant therapy.  I explained there is no "standard "best surveillance plan in [name] with resected carcinoid tumors. I do not recommend surveillance CT scans. She will return for an office visit and chromogranin A level in 8 months.  She is anemic and the MCV is low. She may have iron deficiency. The iron deficiency may be  related to the carcinoid tumors or another site of bleeding. This can be evaluated with stool Hemoccult cards and a ferritin level. She can be referred to Dr.Magod for a colonoscopy.  Approximately 50 minutes were spent with the [name] today. The majority of the time was used for counseling and coordination of care.   [place], [place] 07/05/2014, 4:49 PM

## 2014-07-05 NOTE — [facility] Oncology Navigator Note (Signed)
Met with patient and husband during new patient visit. Explained the role of the GI Nurse Navigator and provided New Patient Packet with information on: 1.  Carcinoid cancer and lab work 2. Support groups 3. Advanced Directives 4. Fall Safety Plan Answered questions, reviewed current treatment plan using TEACH back and provided emotional support. Provided copy of current treatment plan. Made referral to dietician at her request to help her get her diet back on track. Will be sent back to lab to follow up on her Hgb.  Merceda Elks, RN, BSN GI Oncology [place]

## 2014-07-05 NOTE — Telephone Encounter (Signed)
Gave avs & calendar for May & December.

## 2014-07-06 ENCOUNTER — Telehealth: Payer: Self-pay | Admitting: Oncology

## 2014-07-06 ENCOUNTER — Telehealth: Payer: Self-pay | Admitting: *Deleted

## 2014-07-06 DIAGNOSIS — C7A019 Malignant carcinoid tumor of the small intestine, unspecified portion: Secondary | ICD-10-CM

## 2014-07-06 NOTE — Telephone Encounter (Signed)
Pt returned susan's call. Explained need for ferritin lab test and stool cards. Pt wishes to come in early Monday to [facility] for lab to be drawn. POF sent. Explained the possible colonoscopy depending on these results.

## 2014-07-06 NOTE — Telephone Encounter (Signed)
Per Dr. Benay Spice :CBC shows Hgb improved at 9.8 (still low) MCV is also low at 78.7 (can mean iron deficiency). Needs to do stool cards and return them via PCP or here. Also needs to check her serum ferritin level to see how low it is. Needs a colonoscopy if the stool cards return heme positive or ferritin is very low. Attempted to reach her on cell #-left VM. Will also send this a MyChart message.

## 2014-07-06 NOTE — Telephone Encounter (Signed)
Left message to confirm lab appointment.

## 2014-07-09 ENCOUNTER — Other Ambulatory Visit (HOSPITAL_BASED_OUTPATIENT_CLINIC_OR_DEPARTMENT_OTHER): Payer: Medicare Other

## 2014-07-09 ENCOUNTER — Other Ambulatory Visit: Payer: Self-pay | Admitting: *Deleted

## 2014-07-09 DIAGNOSIS — C7A012 Malignant carcinoid tumor of the ileum: Secondary | ICD-10-CM

## 2014-07-09 DIAGNOSIS — C7A019 Malignant carcinoid tumor of the small intestine, unspecified portion: Secondary | ICD-10-CM

## 2014-07-09 LAB — CHCC SMEAR

## 2014-07-09 LAB — FERRITIN CHCC: FERRITIN: 7 ng/mL — AB (ref 9–269)

## 2014-07-11 LAB — CHROMOGRANIN A: CHROMOGRANIN A: 9 ng/mL (ref <=15)

## 2014-07-12 ENCOUNTER — Other Ambulatory Visit (HOSPITAL_BASED_OUTPATIENT_CLINIC_OR_DEPARTMENT_OTHER): Payer: Medicare Other

## 2014-07-12 ENCOUNTER — Other Ambulatory Visit: Payer: Self-pay | Admitting: *Deleted

## 2014-07-12 DIAGNOSIS — C7A019 Malignant carcinoid tumor of the small intestine, unspecified portion: Secondary | ICD-10-CM

## 2014-07-12 DIAGNOSIS — D509 Iron deficiency anemia, unspecified: Secondary | ICD-10-CM | POA: Diagnosis present

## 2014-07-12 LAB — FECAL OCCULT BLOOD, GUAIAC: Occult Blood: NEGATIVE

## 2014-07-13 ENCOUNTER — Encounter: Payer: Self-pay | Admitting: Oncology

## 2014-07-13 ENCOUNTER — Telehealth: Payer: Self-pay | Admitting: *Deleted

## 2014-07-13 DIAGNOSIS — C7A019 Malignant carcinoid tumor of the small intestine, unspecified portion: Secondary | ICD-10-CM

## 2014-07-13 NOTE — Telephone Encounter (Signed)
-----   Message from Ladell Pier, MD sent at 07/12/2014  8:39 PM EDT ----- Please call patient, stool cadrds are negative, f/u with Dr. Watt Climes for evaluate iron deficiency

## 2014-07-13 NOTE — Telephone Encounter (Signed)
Called pt with lab results as noted below, per Dr. Benay Spice. Instructed her to begin ferrous sulfate 325 mg BID (OTC). She voiced understanding. Informed her we will make referral to Dr. Watt Climes. She understands to expect call from schedulers for this appointment.

## 2014-07-13 NOTE — Telephone Encounter (Signed)
-----   Message from Ladell Pier, MD sent at 07/11/2014  8:30 PM EDT ----- Please call patient, chromagrannin A level is normal, she has iron deficiency, needs evaluation by Dr. Watt Climes Please make referral Can start ferrous sulfate 325mg  bid until she sees Dr. Watt Climes

## 2014-07-16 ENCOUNTER — Telehealth: Payer: Self-pay | Admitting: Oncology

## 2014-07-16 NOTE — Telephone Encounter (Signed)
Faxed pt medical records to Dr. Watt Climes

## 2014-07-17 ENCOUNTER — Ambulatory Visit: Payer: Medicare Other | Admitting: Nutrition

## 2014-07-17 NOTE — Progress Notes (Signed)
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  She is a patient of Dr. Julieanne Manson.  Past medical history includes kidney stones, CAD, hypertension, and arthritis.  Medications include Zofran and Zocor.  Labs were reviewed.  Height: 5 feet 2 inches. Weight: 114.8 pounds. Usual body weight: 122 pounds. BMI: 20.99.  Patient states she is a vegetarian.  She would like to become Vegan. Patient has been told not to eat salads. She is about 2 months out from Bowel surgery and would like to start adding fiber back into her diet. Per patient, M.D. is okay with this. Patient reports she has been told to take iron supplements.  Nutrition diagnosis:  Food and nutrition related knowledge deficit related to diagnosis of carcinoid tumor as evidenced by no prior need for nutrition related information.  Intervention: Educated patient on gradual addition of higher fiber foods slowly over time. Recommended patient consume small frequent meals and snacks. Provided fact sheets. Provided education on increasing iron containing foods in her diet. Questions were answered.  Teach back method used.  Contact information provided.  Monitoring, evaluation, goals: Patient will tolerate a healthy vegetarian diet with minimal side effects.  Next visit: Patient to contact me for questions.  **Disclaimer: This note was dictated with voice recognition software. Similar sounding words can inadvertently be transcribed and this note may contain transcription errors which may not have been corrected upon publication of note.**

## 2014-07-20 ENCOUNTER — Telehealth: Payer: Self-pay | Admitting: *Deleted

## 2014-07-20 NOTE — Telephone Encounter (Signed)
Left VM to request confirmation that she has heard from Dr. Perley Jain office regarding referral to him for iron deficiency anemia.

## 2014-07-20 NOTE — Telephone Encounter (Signed)
Pt returned call, left message confirming appointment with Dr. Watt Climes for 07/23/14. Manuela Schwartz, GI navigator made aware.

## 2014-07-23 DIAGNOSIS — R109 Unspecified abdominal pain: Secondary | ICD-10-CM | POA: Diagnosis not present

## 2014-07-23 DIAGNOSIS — D500 Iron deficiency anemia secondary to blood loss (chronic): Secondary | ICD-10-CM | POA: Diagnosis not present

## 2014-08-20 ENCOUNTER — Other Ambulatory Visit: Payer: Self-pay | Admitting: Gastroenterology

## 2014-08-20 DIAGNOSIS — D122 Benign neoplasm of ascending colon: Secondary | ICD-10-CM | POA: Diagnosis not present

## 2014-08-20 DIAGNOSIS — K5730 Diverticulosis of large intestine without perforation or abscess without bleeding: Secondary | ICD-10-CM | POA: Diagnosis not present

## 2014-08-20 DIAGNOSIS — R1084 Generalized abdominal pain: Secondary | ICD-10-CM | POA: Diagnosis not present

## 2014-08-20 DIAGNOSIS — C7A019 Malignant carcinoid tumor of the small intestine, unspecified portion: Secondary | ICD-10-CM | POA: Diagnosis not present

## 2014-08-20 DIAGNOSIS — K317 Polyp of stomach and duodenum: Secondary | ICD-10-CM | POA: Diagnosis not present

## 2014-08-20 DIAGNOSIS — K2950 Unspecified chronic gastritis without bleeding: Secondary | ICD-10-CM | POA: Diagnosis not present

## 2014-08-20 DIAGNOSIS — D509 Iron deficiency anemia, unspecified: Secondary | ICD-10-CM | POA: Diagnosis not present

## 2014-08-20 DIAGNOSIS — K449 Diaphragmatic hernia without obstruction or gangrene: Secondary | ICD-10-CM | POA: Diagnosis not present

## 2014-09-03 ENCOUNTER — Other Ambulatory Visit: Payer: Self-pay

## 2014-09-12 ENCOUNTER — Encounter: Payer: Self-pay | Admitting: Cardiology

## 2014-09-24 ENCOUNTER — Other Ambulatory Visit: Payer: Self-pay | Admitting: Gastroenterology

## 2014-09-24 DIAGNOSIS — R109 Unspecified abdominal pain: Secondary | ICD-10-CM | POA: Diagnosis not present

## 2014-09-24 DIAGNOSIS — D500 Iron deficiency anemia secondary to blood loss (chronic): Secondary | ICD-10-CM | POA: Diagnosis not present

## 2014-09-24 DIAGNOSIS — Z86012 Personal history of benign carcinoid tumor: Secondary | ICD-10-CM

## 2014-10-03 ENCOUNTER — Ambulatory Visit
Admission: RE | Admit: 2014-10-03 | Discharge: 2014-10-03 | Disposition: A | Discharge status: Home / Self Care | Payer: Medicare Other | Source: Ambulatory Visit | Attending: Gastroenterology | Admitting: Gastroenterology

## 2014-10-03 DIAGNOSIS — Z86012 Personal history of benign carcinoid tumor: Secondary | ICD-10-CM | POA: Diagnosis not present

## 2014-10-03 DIAGNOSIS — R109 Unspecified abdominal pain: Secondary | ICD-10-CM

## 2014-10-03 DIAGNOSIS — K760 Fatty (change of) liver, not elsewhere classified: Secondary | ICD-10-CM | POA: Diagnosis not present

## 2014-10-03 DIAGNOSIS — K5660 Unspecified intestinal obstruction: Secondary | ICD-10-CM | POA: Diagnosis not present

## 2014-10-03 MED ORDER — IOPAMIDOL (ISOVUE-300) INJECTION 61%
100.0000 mL | Freq: Once | INTRAVENOUS | Status: AC | PRN
  Administered 2014-10-03: 100 mL via INTRAVENOUS

## 2014-10-08 ENCOUNTER — Other Ambulatory Visit (HOSPITAL_COMMUNITY): Payer: Self-pay | Admitting: Gastroenterology

## 2014-10-08 DIAGNOSIS — C7A8 Other malignant neuroendocrine tumors: Secondary | ICD-10-CM

## 2014-10-16 ENCOUNTER — Encounter (HOSPITAL_COMMUNITY)
Admission: RE | Admit: 2014-10-16 | Discharge: 2014-10-16 | Disposition: A | Discharge status: Home / Self Care | Payer: Medicare Other | Source: Ambulatory Visit | Attending: Gastroenterology | Admitting: Gastroenterology

## 2014-10-16 DIAGNOSIS — C7A8 Other malignant neuroendocrine tumors: Secondary | ICD-10-CM

## 2014-10-16 DIAGNOSIS — R590 Localized enlarged lymph nodes: Secondary | ICD-10-CM | POA: Insufficient documentation

## 2014-10-16 MED ORDER — INDIUM IN-111 PENTETREOTIDE IV KIT: 6.0000 | PACK | Freq: Once | INTRAVENOUS | Status: DC | PRN

## 2014-10-17 ENCOUNTER — Encounter (HOSPITAL_COMMUNITY)
Admission: RE | Admit: 2014-10-17 | Discharge: 2014-10-17 | Disposition: A | Discharge status: Home / Self Care | Payer: Medicare Other | Source: Ambulatory Visit | Attending: Gastroenterology | Admitting: Gastroenterology

## 2014-10-17 DIAGNOSIS — R590 Localized enlarged lymph nodes: Secondary | ICD-10-CM | POA: Diagnosis not present

## 2014-10-17 DIAGNOSIS — C7A8 Other malignant neuroendocrine tumors: Secondary | ICD-10-CM | POA: Diagnosis not present

## 2014-10-18 ENCOUNTER — Encounter (HOSPITAL_COMMUNITY): Payer: Medicare Other

## 2014-10-26 DIAGNOSIS — R933 Abnormal findings on diagnostic imaging of other parts of digestive tract: Secondary | ICD-10-CM | POA: Diagnosis not present

## 2014-10-26 DIAGNOSIS — R109 Unspecified abdominal pain: Secondary | ICD-10-CM | POA: Diagnosis not present

## 2014-10-26 DIAGNOSIS — D500 Iron deficiency anemia secondary to blood loss (chronic): Secondary | ICD-10-CM | POA: Diagnosis not present

## 2014-10-29 ENCOUNTER — Telehealth: Payer: Self-pay | Admitting: *Deleted

## 2014-10-29 NOTE — Telephone Encounter (Signed)
Oncology Nurse Navigator Documentation  Oncology Nurse Navigator Flowsheets 10/29/2014  Navigator Encounter Type 3 month;Telephone  Treatment Phase Abnormal Scans--  Barriers/Navigation Needs No barriers at this time  Time Spent with Patient [place] reports she has recently had octreotide scan and Dr. Watt Climes thinks she has another tumor that could be new, or could have been missed before. She is still having abdominal pain and occasional diarrhea and nausea. Sees surgeon, Dr. [name] on 11/07/14. Requested she call us with her surgery date & she agrees.

## 2014-11-07 ENCOUNTER — Ambulatory Visit: Payer: Self-pay | Admitting: General Surgery

## 2014-11-07 DIAGNOSIS — D3A019 Benign carcinoid tumor of the small intestine, unspecified portion: Secondary | ICD-10-CM | POA: Diagnosis not present

## 2014-11-07 DIAGNOSIS — K5669 Other intestinal obstruction: Secondary | ICD-10-CM | POA: Diagnosis not present

## 2014-11-13 ENCOUNTER — Other Ambulatory Visit (HOSPITAL_COMMUNITY): Payer: Self-pay | Admitting: *Deleted

## 2014-11-13 ENCOUNTER — Encounter (HOSPITAL_COMMUNITY): Payer: Self-pay

## 2014-11-13 ENCOUNTER — Encounter (HOSPITAL_COMMUNITY)
Admission: RE | Admit: 2014-11-13 | Discharge: 2014-11-13 | Disposition: A | Discharge status: Home / Self Care | Payer: Medicare Other | Source: Ambulatory Visit | Attending: General Surgery | Admitting: General Surgery

## 2014-11-13 DIAGNOSIS — K5660 Unspecified intestinal obstruction: Secondary | ICD-10-CM | POA: Insufficient documentation

## 2014-11-13 DIAGNOSIS — Z01812 Encounter for preprocedural laboratory examination: Secondary | ICD-10-CM

## 2014-11-13 DIAGNOSIS — Z0183 Encounter for blood typing: Secondary | ICD-10-CM | POA: Insufficient documentation

## 2014-11-13 HISTORY — DX: Personal history of urinary calculi: Z87.442

## 2014-11-13 HISTORY — DX: Cardiac murmur, unspecified: R01.1

## 2014-11-13 LAB — CBC
HEMATOCRIT: 39.4 % (ref 36.0–46.0)
Hemoglobin: 12.4 g/dL (ref 12.0–15.0)
MCH: 25.7 pg — ABNORMAL LOW (ref 26.0–34.0)
MCHC: 31.5 g/dL (ref 30.0–36.0)
MCV: 81.6 fL (ref 78.0–100.0)
PLATELETS: 214 10*3/uL (ref 150–400)
RBC: 4.83 MIL/uL (ref 3.87–5.11)
RDW: 17.9 % — AB (ref 11.5–15.5)
WBC: 7 10*3/uL (ref 4.0–10.5)

## 2014-11-13 LAB — BASIC METABOLIC PANEL
Anion gap: 6 (ref 5–15)
BUN: 10 mg/dL (ref 6–20)
CALCIUM: 9.2 mg/dL (ref 8.9–10.3)
CO2: 27 mmol/L (ref 22–32)
CREATININE: 0.64 mg/dL (ref 0.44–1.00)
Chloride: 110 mmol/L (ref 101–111)
GFR calc Af Amer: >60 mL/min (ref >60)
GLUCOSE: 97 mg/dL (ref 65–99)
POTASSIUM: 4.8 mmol/L (ref 3.5–5.1)
SODIUM: 143 mmol/L (ref 135–145)

## 2014-11-13 LAB — TYPE AND SCREEN
ABO/RH(D): O NEG
Antibody Screen: NEGATIVE

## 2014-11-13 LAB — ABO/RH: ABO/RH(D): O NEG

## 2014-11-13 NOTE — Pre-Procedure Instructions (Addendum)
Debra [name]  11/13/2014      [id number] - [place], [place] - [address] [place]  [address] Phone: [phone] Fax: [phone]  [id number] - [place], [place] [address] Phone: [phone] Fax: [phone]    Your procedure is scheduled on Thursday, November 15, 2014 .   Report to [facility] Entrance "A" Admitting Office at 8:00 AM.   Call this number if you have problems the morning of surgery: [phone]     Remember:  Do not eat food or drink liquids after midnight Wednesday, 11/14/14.   Take these medicines the morning of surgery with A SIP OF WATER: Tramadol - if needed.  Stop Multivitamins as of today.also STOP all herbel meds, nsaids (aleve,naproxen,advil,ibuprofen) and aspirin   Do not wear jewelry, make-up or nail polish.  Do not wear lotions, powders, or perfumes.  You may wear deodorant.  Do not shave 48 hours prior to surgery.    Do not bring valuables to the hospital.  [facility] is not responsible for any belongings or valuables.  Contacts, dentures or bridgework may not be worn into surgery.  Leave your suitcase in the car.  After surgery it may be brought to your room.  For patients admitted to the hospital, discharge time will be determined by your treatment team.  Special instructions:   Special Instructions: [place] - Preparing for Surgery  Before surgery, you can play an important role.  Because skin is not sterile, your skin needs to be as free of germs as possible.  You can reduce the number of germs on you skin by washing with CHG (chlorahexidine gluconate) soap before surgery.  CHG is an antiseptic cleaner which kills germs and bonds with the skin to continue killing germs even after washing.  Please DO NOT use if you have an allergy to CHG or antibacterial soaps.  If your skin becomes reddened/irritated  stop using the CHG and inform your nurse when you arrive at Short Stay.  Do not shave (including legs and underarms) for at least 48 hours prior to the first CHG shower.  You may shave your face.  Please follow these instructions carefully:   1.  Shower with CHG Soap the night before surgery and the morning of Surgery.  2.  If you choose to wash your hair, wash your hair first as usual with your normal shampoo.  3.  After you shampoo, rinse your hair and body thoroughly to remove the Shampoo.  4.  Use CHG as you would any other liquid soap.  You can apply chg directly  to the skin and wash gently with scrungie or a clean washcloth.  5.  Apply the CHG Soap to your body ONLY FROM THE NECK DOWN.  Do not use on open wounds or open sores.  Avoid contact with your eyes ears, mouth and genitals (private parts).  Wash genitals (private parts)       with your normal soap.  6.  Wash thoroughly, paying special attention to the area where your surgery will be performed.  7.  Thoroughly rinse your body with warm water from the neck down.  8.  DO NOT shower/wash with your normal soap after using and rinsing off the CHG Soap.  9.  Pat yourself dry with a clean towel.  10.  Wear clean pajamas.            11.  Place clean sheets on your bed the night of your first shower and do not sleep with pets.  Day of Surgery  Do not apply any lotions/deodorants the morning of surgery.  Please wear clean clothes to the hospital/surgery center..   Please read over the following fact sheets that you were given. Pain Booklet, Coughing and Deep Breathing, Blood Transfusion Information and Surgical Site Infection Prevention

## 2014-11-13 NOTE — Progress Notes (Addendum)
office called and message left for ?kim for dr [name] re: no order for entereg for surgery. Unsure if need for this surgery. 11/14/14 Crystal from [facility] returned call. Dr Suzie Portela entereg not needed for this surgery.

## 2014-11-14 ENCOUNTER — Other Ambulatory Visit (HOSPITAL_COMMUNITY): Payer: Medicare Other

## 2014-11-14 MED ORDER — METRONIDAZOLE IN NACL 5-0.79 MG/ML-% IV SOLN
500.0000 mg | INTRAVENOUS | Status: AC
  Administered 2014-11-15: 500 mg via INTRAVENOUS
  Filled 2014-11-14 (×3): qty 100

## 2014-11-14 MED ORDER — CIPROFLOXACIN IN D5W 400 MG/200ML IV SOLN
400.0000 mg | INTRAVENOUS | Status: AC
  Administered 2014-11-15: 400 mg via INTRAVENOUS
  Filled 2014-11-14: qty 200

## 2014-11-15 ENCOUNTER — Inpatient Hospital Stay (HOSPITAL_COMMUNITY): Payer: Medicare Other | Admitting: Anesthesiology

## 2014-11-15 ENCOUNTER — Inpatient Hospital Stay (HOSPITAL_COMMUNITY)
Admission: AD | Admit: 2014-11-15 | Discharge: 2014-11-20 | DRG: 345 | Disposition: A | Discharge status: Home / Self Care | Payer: Medicare Other | Source: Ambulatory Visit | Attending: General Surgery | Admitting: General Surgery

## 2014-11-15 ENCOUNTER — Encounter (HOSPITAL_COMMUNITY): Payer: Self-pay | Admitting: Anesthesiology

## 2014-11-15 ENCOUNTER — Encounter (HOSPITAL_COMMUNITY)
Admission: AD | Disposition: A | Discharge status: Home / Self Care | Payer: Self-pay | Source: Ambulatory Visit | Attending: General Surgery

## 2014-11-15 DIAGNOSIS — D3A019 Benign carcinoid tumor of the small intestine, unspecified portion: Secondary | ICD-10-CM | POA: Diagnosis not present

## 2014-11-15 DIAGNOSIS — C7A8 Other malignant neuroendocrine tumors: Secondary | ICD-10-CM | POA: Diagnosis not present

## 2014-11-15 DIAGNOSIS — R590 Localized enlarged lymph nodes: Secondary | ICD-10-CM | POA: Diagnosis present

## 2014-11-15 DIAGNOSIS — D490 Neoplasm of unspecified behavior of digestive system: Principal | ICD-10-CM | POA: Diagnosis present

## 2014-11-15 DIAGNOSIS — I739 Peripheral vascular disease, unspecified: Secondary | ICD-10-CM | POA: Diagnosis present

## 2014-11-15 DIAGNOSIS — Z9049 Acquired absence of other specified parts of digestive tract: Secondary | ICD-10-CM | POA: Diagnosis present

## 2014-11-15 DIAGNOSIS — C179 Malignant neoplasm of small intestine, unspecified: Secondary | ICD-10-CM | POA: Diagnosis not present

## 2014-11-15 DIAGNOSIS — I2510 Atherosclerotic heart disease of native coronary artery without angina pectoris: Secondary | ICD-10-CM | POA: Diagnosis present

## 2014-11-15 DIAGNOSIS — I10 Essential (primary) hypertension: Secondary | ICD-10-CM | POA: Diagnosis present

## 2014-11-15 DIAGNOSIS — Z01812 Encounter for preprocedural laboratory examination: Secondary | ICD-10-CM

## 2014-11-15 DIAGNOSIS — K913 Postprocedural intestinal obstruction: Secondary | ICD-10-CM | POA: Diagnosis present

## 2014-11-15 DIAGNOSIS — C7B8 Other secondary neuroendocrine tumors: Secondary | ICD-10-CM | POA: Diagnosis not present

## 2014-11-15 DIAGNOSIS — K5660 Unspecified intestinal obstruction: Secondary | ICD-10-CM | POA: Diagnosis not present

## 2014-11-15 DIAGNOSIS — M1990 Unspecified osteoarthritis, unspecified site: Secondary | ICD-10-CM | POA: Diagnosis not present

## 2014-11-15 HISTORY — DX: Calculus of kidney: N20.0

## 2014-11-15 HISTORY — PX: SMALL INTESTINE SURGERY: SHX150

## 2014-11-15 HISTORY — DX: Malignant neoplasm of colon, unspecified: C18.9

## 2014-11-15 HISTORY — PX: BOWEL RESECTION: SHX1257

## 2014-11-15 HISTORY — DX: Malignant carcinoid tumor of the small intestine, unspecified portion: C7A.019

## 2014-11-15 HISTORY — DX: Iron deficiency anemia, unspecified: D50.9

## 2014-11-15 LAB — CBC
HEMATOCRIT: 36.3 % (ref 36.0–46.0)
Hemoglobin: 11.6 g/dL — ABNORMAL LOW (ref 12.0–15.0)
MCH: 26 pg (ref 26.0–34.0)
MCHC: 32 g/dL (ref 30.0–36.0)
MCV: 81.2 fL (ref 78.0–100.0)
PLATELETS: 195 10*3/uL (ref 150–400)
RBC: 4.47 MIL/uL (ref 3.87–5.11)
RDW: 17.5 % — AB (ref 11.5–15.5)
WBC: 10.9 10*3/uL — AB (ref 4.0–10.5)

## 2014-11-15 LAB — CREATININE, SERUM: Creatinine, Ser: 0.69 mg/dL (ref 0.44–1.00)

## 2014-11-15 SURGERY — EXCISION, SMALL INTESTINE
Anesthesia: General | Site: Abdomen

## 2014-11-15 MED ORDER — ONDANSETRON HCL 4 MG/2ML IJ SOLN
INTRAMUSCULAR | Status: DC | PRN
  Administered 2014-11-15: 4 mg via INTRAVENOUS

## 2014-11-15 MED ORDER — ENOXAPARIN SODIUM 40 MG/0.4ML ~~LOC~~ SOLN
40.0000 mg | SUBCUTANEOUS | Status: DC
  Administered 2014-11-16 – 2014-11-20 (×5): 40 mg via SUBCUTANEOUS
  Filled 2014-11-15 (×6): qty 0.4

## 2014-11-15 MED ORDER — LIDOCAINE HCL (CARDIAC) 20 MG/ML IV SOLN
INTRAVENOUS | Status: DC | PRN
  Administered 2014-11-15: 60 mg via INTRAVENOUS

## 2014-11-15 MED ORDER — FENTANYL 10 MCG/ML IV SOLN
INTRAVENOUS | Status: DC
  Administered 2014-11-15: 105 ug via INTRAVENOUS
  Administered 2014-11-15: 13:00:00 via INTRAVENOUS
  Administered 2014-11-16: 102.5 ug via INTRAVENOUS
  Administered 2014-11-16: 165 ug via INTRAVENOUS
  Administered 2014-11-16 (×2): via INTRAVENOUS
  Administered 2014-11-16: 330 ug via INTRAVENOUS
  Administered 2014-11-17: 140 ug via INTRAVENOUS
  Administered 2014-11-17: 160 ug via INTRAVENOUS
  Administered 2014-11-17: 01:00:00 via INTRAVENOUS
  Administered 2014-11-17: 30 ug via INTRAVENOUS
  Administered 2014-11-17: 15 ug via INTRAVENOUS
  Filled 2014-11-15 (×4): qty 50

## 2014-11-15 MED ORDER — METOCLOPRAMIDE HCL 5 MG/ML IJ SOLN
INTRAMUSCULAR | Status: AC
  Filled 2014-11-15: qty 2

## 2014-11-15 MED ORDER — MIDAZOLAM HCL 2 MG/2ML IJ SOLN
INTRAMUSCULAR | Status: AC
  Filled 2014-11-15: qty 4

## 2014-11-15 MED ORDER — FENTANYL CITRATE (PF) 250 MCG/5ML IJ SOLN
INTRAMUSCULAR | Status: AC
  Filled 2014-11-15: qty 5

## 2014-11-15 MED ORDER — MENTHOL 3 MG MT LOZG
1.0000 | LOZENGE | OROMUCOSAL | Status: DC | PRN
  Filled 2014-11-15: qty 9

## 2014-11-15 MED ORDER — GLYCOPYRROLATE 0.2 MG/ML IJ SOLN
INTRAMUSCULAR | Status: DC | PRN
  Administered 2014-11-15: 0.3 mg via INTRAVENOUS

## 2014-11-15 MED ORDER — METOCLOPRAMIDE HCL 5 MG/ML IJ SOLN
10.0000 mg | Freq: Once | INTRAMUSCULAR | Status: AC
  Administered 2014-11-15: 10 mg via INTRAVENOUS

## 2014-11-15 MED ORDER — PROPOFOL 10 MG/ML IV BOLUS
INTRAVENOUS | Status: AC
  Filled 2014-11-15: qty 20

## 2014-11-15 MED ORDER — METOPROLOL TARTRATE 1 MG/ML IV SOLN
5.0000 mg | Freq: Four times a day (QID) | INTRAVENOUS | Status: DC
  Administered 2014-11-16 – 2014-11-18 (×13): 5 mg via INTRAVENOUS
  Filled 2014-11-15 (×13): qty 5

## 2014-11-15 MED ORDER — PROMETHAZINE HCL 25 MG/ML IJ SOLN
INTRAMUSCULAR | Status: AC
  Filled 2014-11-15: qty 1

## 2014-11-15 MED ORDER — PHENOL 1.4 % MT LIQD
1.0000 | OROMUCOSAL | Status: DC | PRN
  Filled 2014-11-15: qty 177

## 2014-11-15 MED ORDER — FENTANYL CITRATE (PF) 100 MCG/2ML IJ SOLN
25.0000 ug | INTRAMUSCULAR | Status: DC | PRN
  Administered 2014-11-15 (×3): 50 ug via INTRAVENOUS

## 2014-11-15 MED ORDER — METOPROLOL SUCCINATE ER 50 MG PO TB24
50.0000 mg | ORAL_TABLET | ORAL | Status: AC
  Administered 2014-11-15: 50 mg via ORAL
  Filled 2014-11-15: qty 1

## 2014-11-15 MED ORDER — NALOXONE HCL 0.4 MG/ML IJ SOLN: 0.4000 mg | INTRAMUSCULAR | Status: DC | PRN

## 2014-11-15 MED ORDER — ONDANSETRON HCL 4 MG/2ML IJ SOLN
INTRAMUSCULAR | Status: AC
  Filled 2014-11-15: qty 2

## 2014-11-15 MED ORDER — PROMETHAZINE HCL 25 MG/ML IJ SOLN
6.2500 mg | INTRAMUSCULAR | Status: AC | PRN
Start: 2014-11-15 — End: 2014-11-15
  Administered 2014-11-15 (×2): 6.25 mg via INTRAVENOUS

## 2014-11-15 MED ORDER — ROCURONIUM BROMIDE 100 MG/10ML IV SOLN
INTRAVENOUS | Status: DC | PRN
  Administered 2014-11-15: 30 mg via INTRAVENOUS
  Administered 2014-11-15: 5 mg via INTRAVENOUS

## 2014-11-15 MED ORDER — CHLORHEXIDINE GLUCONATE 4 % EX LIQD
1.0000 | Freq: Once | CUTANEOUS | Status: DC
Start: 2014-11-16 — End: 2014-11-15

## 2014-11-15 MED ORDER — 0.9 % SODIUM CHLORIDE (POUR BTL) OPTIME
TOPICAL | Status: DC | PRN
  Administered 2014-11-15 (×3): 1000 mL

## 2014-11-15 MED ORDER — SODIUM CHLORIDE 0.9 % IJ SOLN: 9.0000 mL | INTRAMUSCULAR | Status: DC | PRN

## 2014-11-15 MED ORDER — DIPHENHYDRAMINE HCL 12.5 MG/5ML PO ELIX: 12.5000 mg | ORAL_SOLUTION | Freq: Four times a day (QID) | ORAL | Status: DC | PRN

## 2014-11-15 MED ORDER — PROPOFOL 10 MG/ML IV BOLUS
INTRAVENOUS | Status: DC | PRN
  Administered 2014-11-15: 40 mg via INTRAVENOUS
  Administered 2014-11-15: 130 mg via INTRAVENOUS

## 2014-11-15 MED ORDER — KCL IN DEXTROSE-NACL 20-5-0.45 MEQ/L-%-% IV SOLN
INTRAVENOUS | Status: AC
  Filled 2014-11-15: qty 1000

## 2014-11-15 MED ORDER — DIPHENHYDRAMINE HCL 50 MG/ML IJ SOLN: 12.5000 mg | Freq: Four times a day (QID) | INTRAMUSCULAR | Status: DC | PRN

## 2014-11-15 MED ORDER — FENTANYL CITRATE (PF) 100 MCG/2ML IJ SOLN
INTRAMUSCULAR | Status: AC
  Filled 2014-11-15: qty 2

## 2014-11-15 MED ORDER — PANTOPRAZOLE SODIUM 40 MG IV SOLR
40.0000 mg | Freq: Every day | INTRAVENOUS | Status: DC
  Administered 2014-11-15 – 2014-11-19 (×5): 40 mg via INTRAVENOUS
  Filled 2014-11-15 (×5): qty 40

## 2014-11-15 MED ORDER — MIDAZOLAM HCL 5 MG/5ML IJ SOLN
INTRAMUSCULAR | Status: DC | PRN
  Administered 2014-11-15: 2 mg via INTRAVENOUS

## 2014-11-15 MED ORDER — KCL IN DEXTROSE-NACL 20-5-0.45 MEQ/L-%-% IV SOLN
INTRAVENOUS | Status: DC
  Administered 2014-11-15 – 2014-11-17 (×4): via INTRAVENOUS
  Administered 2014-11-17: 100 mL via INTRAVENOUS
  Administered 2014-11-17 – 2014-11-18 (×2): via INTRAVENOUS
  Administered 2014-11-18: 75 mL via INTRAVENOUS
  Administered 2014-11-19: 22:00:00 via INTRAVENOUS
  Filled 2014-11-15 (×8): qty 1000

## 2014-11-15 MED ORDER — ONDANSETRON HCL 4 MG/2ML IJ SOLN
4.0000 mg | Freq: Four times a day (QID) | INTRAMUSCULAR | Status: DC | PRN
  Administered 2014-11-15: 4 mg via INTRAVENOUS
  Filled 2014-11-15: qty 2

## 2014-11-15 MED ORDER — FENTANYL CITRATE (PF) 100 MCG/2ML IJ SOLN
INTRAMUSCULAR | Status: DC | PRN
  Administered 2014-11-15: 25 ug via INTRAVENOUS
  Administered 2014-11-15: 50 ug via INTRAVENOUS
  Administered 2014-11-15: 25 ug via INTRAVENOUS

## 2014-11-15 MED ORDER — EPHEDRINE SULFATE 50 MG/ML IJ SOLN
INTRAMUSCULAR | Status: DC | PRN
  Administered 2014-11-15: 10 mg via INTRAVENOUS

## 2014-11-15 MED ORDER — ARTIFICIAL TEARS OP OINT
TOPICAL_OINTMENT | OPHTHALMIC | Status: DC | PRN
  Administered 2014-11-15: 1 via OPHTHALMIC

## 2014-11-15 MED ORDER — NEOSTIGMINE METHYLSULFATE 10 MG/10ML IV SOLN
INTRAVENOUS | Status: DC | PRN
  Administered 2014-11-15: 2 mg via INTRAVENOUS

## 2014-11-15 MED ORDER — LACTATED RINGERS IV SOLN
INTRAVENOUS | Status: DC
  Administered 2014-11-15 (×2): via INTRAVENOUS

## 2014-11-15 SURGICAL SUPPLY — 64 items
BLADE SURG ROTATE 9660 (MISCELLANEOUS) IMPLANT
CANISTER SUCTION 2500CC (MISCELLANEOUS) ×2 IMPLANT
CHLORAPREP W/TINT 26ML (MISCELLANEOUS) ×2 IMPLANT
CONT SPEC 4OZ CLIKSEAL STRL BL (MISCELLANEOUS) ×1 IMPLANT
COVER MAYO STAND STRL (DRAPES) ×4 IMPLANT
COVER SURGICAL LIGHT HANDLE (MISCELLANEOUS) ×2 IMPLANT
DRAPE LAPAROSCOPIC ABDOMINAL (DRAPES) ×2 IMPLANT
DRAPE PROXIMA HALF (DRAPES) ×4 IMPLANT
DRAPE UTILITY XL STRL (DRAPES) ×10 IMPLANT
DRAPE WARM FLUID 44X44 (DRAPE) ×2 IMPLANT
DRSG OPSITE POSTOP 4X10 (GAUZE/BANDAGES/DRESSINGS) IMPLANT
DRSG OPSITE POSTOP 4X8 (GAUZE/BANDAGES/DRESSINGS) IMPLANT
ELECT BLADE 6.5 EXT (BLADE) ×2 IMPLANT
ELECT CAUTERY BLADE 6.4 (BLADE) ×4 IMPLANT
ELECT REM PT RETURN 9FT ADLT (ELECTROSURGICAL) ×2
ELECTRODE REM PT RTRN 9FT ADLT (ELECTROSURGICAL) ×1 IMPLANT
GLOVE BIO SURGEON STRL SZ7 (GLOVE) ×2 IMPLANT
GLOVE BIO SURGEON STRL SZ8 (GLOVE) ×4 IMPLANT
GLOVE BIOGEL PI IND STRL 7.0 (GLOVE) IMPLANT
GLOVE BIOGEL PI IND STRL 8 (GLOVE) ×2 IMPLANT
GLOVE BIOGEL PI INDICATOR 7.0 (GLOVE) ×4
GLOVE BIOGEL PI INDICATOR 8 (GLOVE) ×2
GLOVE ECLIPSE 6.5 STRL STRAW (GLOVE) ×2 IMPLANT
GLOVE ECLIPSE 7.5 STRL STRAW (GLOVE) ×2 IMPLANT
GOWN STRL REUS W/ TWL LRG LVL3 (GOWN DISPOSABLE) ×4 IMPLANT
GOWN STRL REUS W/ TWL XL LVL3 (GOWN DISPOSABLE) ×2 IMPLANT
GOWN STRL REUS W/TWL LRG LVL3 (GOWN DISPOSABLE) ×8
GOWN STRL REUS W/TWL XL LVL3 (GOWN DISPOSABLE) ×4
KIT BASIN OR (CUSTOM PROCEDURE TRAY) ×2 IMPLANT
KIT ROOM TURNOVER OR (KITS) ×2 IMPLANT
LEGGING LITHOTOMY PAIR STRL (DRAPES) IMPLANT
LIGASURE IMPACT 36 18CM CVD LR (INSTRUMENTS) ×1 IMPLANT
LIQUID BAND (GAUZE/BANDAGES/DRESSINGS) ×1 IMPLANT
NS IRRIG 1000ML POUR BTL (IV SOLUTION) ×5 IMPLANT
PACK GENERAL/GYN (CUSTOM PROCEDURE TRAY) ×2 IMPLANT
PAD ARMBOARD 7.5X6 YLW CONV (MISCELLANEOUS) ×2 IMPLANT
PENCIL BUTTON HOLSTER BLD 10FT (ELECTRODE) ×2 IMPLANT
RELOAD PROXIMATE 75MM BLUE (ENDOMECHANICALS) ×4 IMPLANT
RELOAD STAPLE 75 3.8 BLU REG (ENDOMECHANICALS) IMPLANT
SPECIMEN JAR X LARGE (MISCELLANEOUS) ×2 IMPLANT
SPONGE LAP 18X18 X RAY DECT (DISPOSABLE) IMPLANT
STAPLER GUN LINEAR PROX 60 (STAPLE) ×1 IMPLANT
STAPLER PROXIMATE 75MM BLUE (STAPLE) ×1 IMPLANT
STAPLER VISISTAT 35W (STAPLE) ×2 IMPLANT
SUCTION POOLE TIP (SUCTIONS) ×2 IMPLANT
SURGILUBE 2OZ TUBE FLIPTOP (MISCELLANEOUS) IMPLANT
SUT MNCRL AB 4-0 PS2 18 (SUTURE) ×1 IMPLANT
SUT PDS AB 1 TP1 96 (SUTURE) ×4 IMPLANT
SUT PROLENE 2 0 CT2 30 (SUTURE) IMPLANT
SUT PROLENE 2 0 KS (SUTURE) IMPLANT
SUT SILK 2 0 SH CR/8 (SUTURE) ×2 IMPLANT
SUT SILK 2 0 TIES 10X30 (SUTURE) ×2 IMPLANT
SUT SILK 3 0 SH CR/8 (SUTURE) ×3 IMPLANT
SUT SILK 3 0 TIES 10X30 (SUTURE) ×2 IMPLANT
SUT VIC AB 3-0 SH 18 (SUTURE) IMPLANT
SUT VIC AB 3-0 SH 27 (SUTURE) ×2
SUT VIC AB 3-0 SH 27X BRD (SUTURE) IMPLANT
SYR BULB IRRIGATION 50ML (SYRINGE) ×1 IMPLANT
TOWEL OR 17X26 10 PK STRL BLUE (TOWEL DISPOSABLE) ×3 IMPLANT
TRAY FOLEY CATH 14FRSI W/METER (CATHETERS) ×2 IMPLANT
TRAY PROCTOSCOPIC FIBER OPTIC (SET/KITS/TRAYS/PACK) IMPLANT
TUBE CONNECTING 12X1/4 (SUCTIONS) ×2 IMPLANT
WATER STERILE IRR 1000ML POUR (IV SOLUTION) IMPLANT
YANKAUER SUCT BULB TIP NO VENT (SUCTIONS) ×2 IMPLANT

## 2014-11-15 NOTE — Transfer of Care (Signed)
Immediate Anesthesia Transfer of Care Note  Patient: Debra [name]  Procedure(s) Performed: Procedure(s): SMALL BOWEL RESECTION (N/A)  Patient Location: PACU  Anesthesia Type:General  Level of Consciousness: awake, sedated and patient cooperative  Airway & Oxygen Therapy: Patient Spontanous Breathing and Patient connected to face mask oxygen  Post-op Assessment: Report given to RN, Post -op Vital signs reviewed and stable and Patient moving all extremities  Post vital signs: Reviewed and stable  Last Vitals:  Filed Vitals:   11/15/14 0814  BP: 127/55  Pulse: 71  Temp: 36.6 C  Resp: 20    Complications: No apparent anesthesia complications

## 2014-11-15 NOTE — H&P (Signed)
  History of Present Illness [name] E. [name] MD; 11/07/2014 10:53 AM) The patient is a 64 year old female who presents with small bowel obstruction. [place] is well known to me status post laparoscopic small bowel resection for carcinoid in March. She was readmitted early for some obstructive symptoms but that resolved. Over the summer she has had increasing frequency of episodes of crampy abdominal pain. No significant nausea or vomiting. She has undergone further thorough workup by Dr. Watt Climes including upper and lower endoscopy. CT enterography revealed some evidence of partial obstruction at her anastomosis. Octreotide scan was then obtained demonstrating one focus possibly in the mesentery of the central abdomen. She usually feels well for many days and then the crampy pain returns.   Allergies Elbert Ewings, CMA; 11/07/2014 10:31 AM) Codeine Phosphate *ANALGESICS - OPIOID* Penicillin G Potassium *PENICILLINS* Penicillins Anaphylaxis. HYDROmorphone HCl *ANALGESICS - OPIOID* ALL OPIOIDS!!!!  Medication History Elbert Ewings, CMA; 11/07/2014 10:32 AM) Metoprolol Tartrate (50MG  Tablet, Oral daily) Active. Aspirin EC Low Strength (81MG  Tablet DR, Oral daily) Active. Simvastatin (20MG  Tablet, Oral daily) Active. TraMADol HCl (50MG  Tablet, Oral) Active. Medications Reconciled  Vitals Elbert Ewings CMA; 11/07/2014 10:32 AM) 11/07/2014 10:32 AM Weight: 109 lb Height: 62in Body Surface Area: 1.47 m Body Mass Index: 19.94 kg/m Temp.: 97.37F(Temporal)  Pulse: 79 (Regular)  BP: 122/74 (Sitting, Left Arm, Standard)    Physical Exam [name] E. [name] MD; 11/07/2014 10:55 AM) General Note: No distress   Head and Neck Note: Supple and nontender   Eye Note: PERRLA, wears glasses   Chest and Lung Exam Note: Clear to auscultation bilaterally   Cardiovascular Note: Regular rate and rhythm, distal pulses are intact, no significant peripheral  edema   Abdomen Note: Soft, lower midline well-healed, no hernias, no tenderness, no masses, active bowel sounds   Peripheral Vascular Note: Good perfusion and distal pulses   Neurologic Note: Speech fluent, gait normal, moves all extremities well   Musculoskeletal Note: No deformity or tenderness     Assessment & Plan [name] E. [name] MD; 11/07/2014 10:57 AM) SMALL BOWEL OBSTRUCTION, PARTIAL (560.9  K56.69) Impression: Partial small bowel obstruction in the region of her previous anastomosis. This may be due to scar tissue or another carcinoid tumor in the region. Alternately, this other carcinoid tissue may be in the mesentery. I spoke at length with Dr. Watt Climes. I agree we should proceed with open small bowel resection to remove the area of anastomosis and the associated mesentery. I will also be able to inspect the rest of her small bowel. Fortunately, only one locus lit up on her octreotide scan. She is eager to schedule in the near future and we will try to move forward next week. Procedure, risks, and benefits were discussed in detail with her and her husband and age and her questions. CARCINOID TUMOR OF SMALL INTESTINE (209.40  D3A.019)   [name], MD, MPH, FACS Trauma: [phone] General Surgery: [phone]

## 2014-11-15 NOTE — Anesthesia Postprocedure Evaluation (Signed)
  Anesthesia Post-op Note  Patient: Debra [name]  Procedure(s) Performed: Procedure(s): SMALL BOWEL RESECTION (N/A)  Patient Location: PACU  Anesthesia Type:General  Level of Consciousness: awake  Airway and Oxygen Therapy: Patient Spontanous Breathing  Post-op Pain: mild  Post-op Assessment: Post-op Vital signs reviewed              Post-op Vital Signs: Reviewed  Last Vitals:  Filed Vitals:   11/15/14 1230  BP:   Pulse:   Temp: 36.4 C  Resp:     Complications: No apparent anesthesia complications

## 2014-11-15 NOTE — Op Note (Signed)
11/15/2014  12:13 PM  PATIENT:  Debra [name]  64 y.o. female  PRE-OPERATIVE DIAGNOSIS:  partial small bowel obstruction, carcinoid  POST-OPERATIVE DIAGNOSIS:  partial small bowel obstruction due to carcinoid tumors  PROCEDURE:  Procedure(s): SMALL BOWEL RESECTION SMALL BOWEL BIOPSY  SURGEON:  [name], M.D.  ASSISTANTS: Judeth Horn, M.D.   ANESTHESIA:   general  EBL:  Total I/O In: 1000 [I.V.:1000] Out: 70 [Urine:75]  BLOOD ADMINISTERED:none  DRAINS: none   SPECIMEN:  Excision  DISPOSITION OF SPECIMEN:  PATHOLOGY  COUNTS:  YES  DICTATION: .Dragon Dictation Findings: Small bowel intraluminal mass just proximal to the anastomosis causing partial obstruction, additional small bowel mass more proximal to the anastomosis, third small bowel mass more distal than the anastomosis, small bowel mesenteric lymphadenopathy. A small yellow tumor in the wall of the jejunum was also sent for frozen section which returned normal small bowel wall. The liver felt smooth.  Debra [name] presents for small bowel resection. She is status post laparoscopic small bowel resection earlier this year for partial obstruction which ended up being caused by 2 carcinoid tumors. She has had ongoing partial obstructions and she is undergoing exploration. Informed consent was obtained. She did undergo a bowel prep. She received intravenous antibiotic. She was brought to the operating room and general endotracheal anesthesia was administered by the anesthesia staff. Her abdomen was prepped and draped in sterile fashion after the nursing staff placed a Foley. We did a time out procedure. Midline incision was made extending cephalad from her previous scar. Subcutaneous tissues were dissected down revealing the fascia which was divided along the midline.  Cavity was entered under direct vision. There were no significant adhesions to the abdominal wall. Fascia was opened the length of the incision. There is one  small omental adhesion which was taken down inside. We then ran the bowel. Small bowel was run from the ligament of Treitz down to the terminal ileum. We found the anastomosis and it had a 1 cm mass several centimeters proximal to it with some kinking of the bowel, likely because of partial obstruction. The remainder of the bowel was closely palpated and we did find 2 other masses, less than a centimeter each proximal and distal to the anastomosis. There was associated lymphadenopathy with this part of the mesentery. There was some other scattered yellow punctate 1 mm lesions along the wall of the bowel in several areas. We sent 1 from the jejunum for frozen section. After excising it, but there was closed with multiple 3-0 silk sutures. Frozen returned back normal. We then proceeded with a small bowel resection to encompass all 3 tumors in the anastomosis. Bowel was divided with GIA-75 proximally and distally. Mesentery was taken down using accommodation of the LigaSure and clamps with ties and suture ligature. We removed the palpable lymphadenopathy in the mesentery as part of specimen. This was passed off after marking it for orientation. He did a side-to-side anastomosis with GIA-75 stapler with common defect closed with TA 60. Some additional sutures were placed both at the apex and along the staple line. Mesenteric defect was closed with interrupted 2-0 silk. We changed our gloves. Anastomosis was reinspected and was pink and viable. The previous small bowel biopsy site was also reinspected and appeared nicely closed. Abdomen was copiously irrigated. Bowel was returned to anatomic positioning. Nasogastric tube was adjusted in the stomach. The liver was palpated and was completely smooth. Irrigation fluid was evacuated. Fascia was closed with running #1 looped PDS. Subcutaneous  tissues were irrigated and closed with running 4-0 Monocryl followed by liquid band. All counts were correct. She tolerated the  procedure well without apparent complications and was taken recovery in stable condition. PATIENT DISPOSITION:  PACU - hemodynamically stable.   Delay start of Pharmacological VTE agent (>24hrs) due to surgical blood loss or risk of bleeding:  no  [name], MD, MPH, FACS Pager: [phone]  9/8/201612:13 PM

## 2014-11-15 NOTE — Anesthesia Preprocedure Evaluation (Addendum)
Anesthesia Evaluation  Patient identified by MRN, date of birth, ID band Patient awake    Reviewed: Allergy & Precautions, NPO status , Patient's Chart, lab work & pertinent test results  History of Anesthesia Complications (+) PONV  Airway Mallampati: II  TM Distance: >3 FB Neck ROM: Full    Dental  (+) Teeth Intact, Dental Advisory Given   Pulmonary neg pulmonary ROS,    breath sounds clear to auscultation       Cardiovascular hypertension, Pt. on home beta blockers and Pt. on medications + CAD and + Peripheral Vascular Disease   Rhythm:Regular Rate:Normal     Neuro/Psych    GI/Hepatic negative GI ROS, Neg liver ROS,   Endo/Other  negative endocrine ROS  Renal/GU negative Renal ROS     Musculoskeletal  (+) Arthritis ,   Abdominal   Peds  Hematology   Anesthesia Other Findings   Reproductive/Obstetrics                          Anesthesia Physical Anesthesia Plan  ASA: III  Anesthesia Plan:    Post-op Pain Management:    Induction: Intravenous  Airway Management Planned: Oral ETT  Additional Equipment:   Intra-op Plan:   Post-operative Plan: Possible Post-op intubation/ventilation  Informed Consent: I have reviewed the patients History and Physical, chart, labs and discussed the procedure including the risks, benefits and alternatives for the proposed anesthesia with the patient or authorized representative who has indicated his/her understanding and acceptance.   Dental advisory given  Plan Discussed with: CRNA and Anesthesiologist  Anesthesia Plan Comments:         Anesthesia Quick Evaluation

## 2014-11-15 NOTE — Anesthesia Procedure Notes (Signed)
Procedure Name: Intubation Date/Time: 11/15/2014 10:42 AM Performed by: Williemae Area B Pre-anesthesia Checklist: Patient identified, Emergency Drugs available, Suction available, Patient being monitored and Timeout performed Patient Re-evaluated:Patient Re-evaluated prior to inductionOxygen Delivery Method: Circle system utilized Preoxygenation: Pre-oxygenation with 100% oxygen Intubation Type: IV induction Ventilation: Mask ventilation without difficulty and Oral airway inserted - appropriate to patient size Laryngoscope Size: Sabra Heck and 2 Grade View: Grade I Tube type: Oral Tube size: 7.0 mm Number of attempts: 2 (first attempt by SRNA grade 3 view unsuccessful insertion, Dr Oletta Lamas with 2nd attempt, successful) Airway Equipment and Method: Stylet and Oral airway Placement Confirmation: ETT inserted through vocal cords under direct vision,  positive ETCO2 and breath sounds checked- equal and bilateral Secured at: 23 cm Tube secured with: Tape Dental Injury: Teeth and Oropharynx as per pre-operative assessment and Injury to lip  Difficulty Due To: Difficulty was unanticipated and Difficult Airway- due to anterior larynx

## 2014-11-15 NOTE — Interval H&P Note (Signed)
History and Physical Interval Note:  11/15/2014 9:21 AM  Debra [name]  has presented today for surgery, with the diagnosis of partial small bowel obstruction, carcinoid  The various methods of treatment have been discussed with the patient and family. After consideration of risks, benefits and other options for treatment, the patient has consented to  Procedure(s): SMALL BOWEL RESECTION (N/A) as a surgical intervention .  The patient's history has been reviewed, patient re-examined, no change in status, stable for surgery.  I have reviewed the patient's chart and labs.  Questions were answered to the patient's satisfaction.     Debra [name] E

## 2014-11-16 ENCOUNTER — Encounter (HOSPITAL_COMMUNITY): Payer: Self-pay | Admitting: General Surgery

## 2014-11-16 ENCOUNTER — Telehealth: Payer: Self-pay | Admitting: Oncology

## 2014-11-16 ENCOUNTER — Other Ambulatory Visit: Payer: Self-pay | Admitting: *Deleted

## 2014-11-16 LAB — BASIC METABOLIC PANEL
ANION GAP: 6 (ref 5–15)
BUN: <5 mg/dL — ABNORMAL LOW (ref 6–20)
CO2: 29 mmol/L (ref 22–32)
Calcium: 8.4 mg/dL — ABNORMAL LOW (ref 8.9–10.3)
Chloride: 102 mmol/L (ref 101–111)
Creatinine, Ser: 0.68 mg/dL (ref 0.44–1.00)
GFR calc Af Amer: >60 mL/min (ref >60)
GFR calc non Af Amer: >60 mL/min (ref >60)
GLUCOSE: 146 mg/dL — AB (ref 65–99)
POTASSIUM: 4.4 mmol/L (ref 3.5–5.1)
Sodium: 137 mmol/L (ref 135–145)

## 2014-11-16 LAB — CBC
HEMATOCRIT: 37.2 % (ref 36.0–46.0)
HEMOGLOBIN: 11.8 g/dL — AB (ref 12.0–15.0)
MCH: 25.8 pg — ABNORMAL LOW (ref 26.0–34.0)
MCHC: 31.7 g/dL (ref 30.0–36.0)
MCV: 81.4 fL (ref 78.0–100.0)
Platelets: 198 10*3/uL (ref 150–400)
RBC: 4.57 MIL/uL (ref 3.87–5.11)
RDW: 17.6 % — ABNORMAL HIGH (ref 11.5–15.5)
WBC: 9.6 10*3/uL (ref 4.0–10.5)

## 2014-11-16 NOTE — Telephone Encounter (Signed)
lvm for pt regarding to OCT appt..... °

## 2014-11-16 NOTE — Progress Notes (Signed)
Per Dr. Cheryle Horsfall oncology does need to see her again post op. POF to scheduler.

## 2014-11-16 NOTE — Progress Notes (Signed)
1 Day Post-Op  Subjective: No flatus, pain better controlled this AM  Objective: Vital signs in last 24 hours: Temp:  [97 F (36.1 C)-99.3 F (37.4 C)] 98.5 F (36.9 C) (09/09 0442) Pulse Rate:  [57-82] 80 (09/09 0442) Resp:  [10-16] 11 (09/09 0442) BP: (124-171)/(48-78) 124/76 mmHg (09/09 0442) SpO2:  [96 %-100 %] 98 % (09/09 0442) FiO2 (%):  [28 %] 28 % (09/08 1759) Last BM Date: 11/14/14  Intake/Output from previous day: 09/08 0701 - 09/09 0700 In: 2918.3 [I.V.:2918.3] Out: 2855 [Urine:2625; Emesis/NG output:220; Blood:10] Intake/Output this shift:    General appearance: cooperative Resp: clear to auscultation bilaterally Cardio: regular rate and rhythm GI: soft, quiet, min drainage from woind - dressing placed  Lab Results:   Recent Labs  11/15/14 1850 11/16/14 0423  WBC 10.9* 9.6  HGB 11.6* 11.8*  HCT 36.3 37.2  PLT 195 198   BMET  Recent Labs  11/13/14 1028 11/15/14 1850 11/16/14 0423  NA 143  --  137  K 4.8  --  4.4  CL 110  --  102  CO2 27  --  29  GLUCOSE 97  --  146*  BUN 10  --  <5*  CREATININE 0.64 0.69 0.68  CALCIUM 9.2  --  8.4*   PT/INR No results for input(s): LABPROT, INR in the last 72 hours. ABG No results for input(s): PHART, HCO3 in the last 72 hours.  Invalid input(s): PCO2, PO2  Studies/Results: No results found.  Anti-infectives: Anti-infectives    Start     Dose/Rate Route Frequency Ordered Stop   11/15/14 0945  metroNIDAZOLE (FLAGYL) IVPB 500 mg     500 mg 100 mL/hr over 60 Minutes Intravenous On call to O.R. 11/14/14 1429 11/15/14 1054   11/15/14 0945  ciprofloxacin (CIPRO) IVPB 400 mg     400 mg 200 mL/hr over 60 Minutes Intravenous On call to O.R. 11/14/14 1429 11/15/14 1042      Assessment/Plan: s/p Procedure(s): SMALL BOWEL RESECTION (N/A) POD#1 - await return of bowel function HTN - lopressor IV FEN - ice chips, lytes OK VTE - Lovenox  LOS: 1 day    Debra [name] E 11/16/2014

## 2014-11-16 NOTE — Progress Notes (Signed)
Patient ID: Debra [name], female   DOB: [birth year], 64 y.o.   MRN: [id number] I reviewed her pathology with her and gave her a copy. [name], MD, MPH, FACS Trauma: [phone] General Surgery: [phone]

## 2014-11-16 NOTE — Progress Notes (Signed)
Initial Nutrition Assessment  DOCUMENTATION CODES:   Not applicable  INTERVENTION:  None at this time   NUTRITION DIAGNOSIS:   Inadequate oral intake related to inability to eat, other (see comment) (Pain when eating) as evidenced by NPO status, per patient/family report.    GOAL:   Patient will meet greater than or equal to 90% of their needs    MONITOR:   I & O's, Skin, Diet advancement, Labs, Weight trends  REASON FOR ASSESSMENT:   Malnutrition Screening Tool    ASSESSMENT:   64 yo female s/p Small Bowel Resection (carcanoid) in March; pt complains of crampy pain at times, followed by bouts of feeling well; underwent upper and lower endoscopy, found possible obstructions near anamostis; Admitted for obstruction, pt undergoing suction at the moment. Pt was also diagnosed with iron deficient anemia; is vegetarian and  has tried iron pills, caused severe abdominal pain;. Pt does not exhibit signs of malnutrition. Pt does exhibit 11#/9% severe wt loss in 6 months. States she would eat and have pain at times, therefore has eaten less, but is doing better. Pts Husband reports she is still very active physically. Follow for diet advancement., consider Ensure Enlive upon advancement.    Diet Order:  Diet NPO time specified Except for: Ice Chips  Skin:  Wound (see comment) (Bleeding wound to abdomen.)  Last BM:  11/14/2014  Height:   Ht Readings from Last 1 Encounters:  11/13/14 5\' 2"  (1.575 m)    Weight:   Wt Readings from Last 1 Encounters:  11/15/14 109 lb (49.442 kg)    Ideal Body Weight:  50 kg  BMI:  Body mass index is 19.93 kg/(m^2).  Estimated Nutritional Needs:   Kcal:  1500-1700  Protein:  60-70  Fluid:  >=/ 1.5L as tolerated.  EDUCATION NEEDS:   No education needs identified at this time  Satira Anis. Angle Dirusso, MS, RD LDN After Hours/Weekend Pager [phone]

## 2014-11-16 NOTE — Care Management Note (Signed)
Case Management Note  Patient Details  Name: Debra [name] MRN: [id number] Date of Birth: 1950-06-14  Subjective/Objective:                    Action/Plan:  UR completed  Expected Discharge Date:                  Expected Discharge Plan:  Home/Self Care  In-House Referral:     Discharge planning Services     Post Acute Care Choice:    Choice offered to:     DME Arranged:    DME Agency:     HH Arranged:    [place] Agency:     Status of Service:  In process, will continue to follow  Medicare Important Message Given:    Date Medicare IM Given:    Medicare IM give by:    Date Additional Medicare IM Given:    Additional Medicare Important Message give by:     If discussed at [place] of Stay Meetings, dates discussed:    Additional Comments:  Marilu Favre, RN 11/16/2014, 8:28 AM

## 2014-11-17 LAB — BASIC METABOLIC PANEL
ANION GAP: 4 — AB (ref 5–15)
BUN: <5 mg/dL — ABNORMAL LOW (ref 6–20)
CALCIUM: 8.4 mg/dL — AB (ref 8.9–10.3)
CO2: 29 mmol/L (ref 22–32)
CREATININE: 0.62 mg/dL (ref 0.44–1.00)
Chloride: 105 mmol/L (ref 101–111)
Glucose, Bld: 142 mg/dL — ABNORMAL HIGH (ref 65–99)
Potassium: 4.3 mmol/L (ref 3.5–5.1)
SODIUM: 138 mmol/L (ref 135–145)

## 2014-11-17 LAB — CBC
HEMATOCRIT: 35.1 % — AB (ref 36.0–46.0)
Hemoglobin: 11.2 g/dL — ABNORMAL LOW (ref 12.0–15.0)
MCH: 26.4 pg (ref 26.0–34.0)
MCHC: 31.9 g/dL (ref 30.0–36.0)
MCV: 82.8 fL (ref 78.0–100.0)
PLATELETS: 176 10*3/uL (ref 150–400)
RBC: 4.24 MIL/uL (ref 3.87–5.11)
RDW: 17.7 % — AB (ref 11.5–15.5)
WBC: 7.9 10*3/uL (ref 4.0–10.5)

## 2014-11-17 MED ORDER — FENTANYL CITRATE (PF) 100 MCG/2ML IJ SOLN: 25.0000 ug | INTRAMUSCULAR | Status: DC | PRN

## 2014-11-17 NOTE — Progress Notes (Signed)
2 Days Post-Op  Subjective: Doing well Ambulating a lot No flatus Wants to stop the PCA  Objective: Vital signs in last 24 hours: Temp:  [98.1 F (36.7 C)-98.6 F (37 C)] 98.1 F (36.7 C) (09/10 0655) Pulse Rate:  [81-97] 94 (09/10 0655) Resp:  [9-15] 15 (09/10 0911) BP: (115-140)/(63-75) 115/63 mmHg (09/10 0655) SpO2:  [95 %-100 %] 96 % (09/10 0658) Last BM Date: 11/14/14  Intake/Output from previous day: 09/09 0701 - 09/10 0700 In: 1798.3 [P.O.:240; I.V.:1558.3] Out: 2150 [Urine:1750; Emesis/NG output:400] Intake/Output this shift:    Lungs clear Abdomen soft, non-distended  Lab Results:   Recent Labs  11/16/14 0423 11/17/14 0345  WBC 9.6 7.9  HGB 11.8* 11.2*  HCT 37.2 35.1*  PLT 198 176   BMET  Recent Labs  11/16/14 0423 11/17/14 0345  NA 137 138  K 4.4 4.3  CL 102 105  CO2 29 29  GLUCOSE 146* 142*  BUN <5* <5*  CREATININE 0.68 0.62  CALCIUM 8.4* 8.4*   PT/INR No results for input(s): LABPROT, INR in the last 72 hours. ABG No results for input(s): PHART, HCO3 in the last 72 hours.  Invalid input(s): PCO2, PO2  Studies/Results: No results found.  Anti-infectives: Anti-infectives    Start     Dose/Rate Route Frequency Ordered Stop   11/15/14 0945  metroNIDAZOLE (FLAGYL) IVPB 500 mg     500 mg 100 mL/hr over 60 Minutes Intravenous On call to O.R. 11/14/14 1429 11/15/14 1054   11/15/14 0945  ciprofloxacin (CIPRO) IVPB 400 mg     400 mg 200 mL/hr over 60 Minutes Intravenous On call to O.R. 11/14/14 1429 11/15/14 1042      Assessment/Plan: s/p Procedure(s): SMALL BOWEL RESECTION (N/A)  Continue NPO and NG D/c PCA  LOS: 2 days    Matisse Salais A 11/17/2014

## 2014-11-18 MED ORDER — ONDANSETRON HCL 4 MG/2ML IJ SOLN: 4.0000 mg | Freq: Four times a day (QID) | INTRAMUSCULAR | Status: DC | PRN

## 2014-11-18 NOTE — Progress Notes (Signed)
3 Days Post-Op  Subjective: Had some nausea last night Denies any flatus Minimal pain  Objective: Vital signs in last 24 hours: Temp:  [98.1 F (36.7 C)-99 F (37.2 C)] 98.1 F (36.7 C) (09/11 0650) Pulse Rate:  [84-90] 88 (09/11 0650) Resp:  [15-17] 17 (09/11 0650) BP: (128-148)/(64-72) 136/64 mmHg (09/11 0650) SpO2:  [95 %-98 %] 95 % (09/11 0650) Last BM Date: 11/14/14  Intake/Output from previous day: 09/10 0701 - 09/11 0700 In: -  Out: 1750 [Urine:1750] Intake/Output this shift:    Abdomen with mild fullness, good bowel sounds Incision clean  Lab Results:   Recent Labs  11/16/14 0423 11/17/14 0345  WBC 9.6 7.9  HGB 11.8* 11.2*  HCT 37.2 35.1*  PLT 198 176   BMET  Recent Labs  11/16/14 0423 11/17/14 0345  NA 137 138  K 4.4 4.3  CL 102 105  CO2 29 29  GLUCOSE 146* 142*  BUN <5* <5*  CREATININE 0.68 0.62  CALCIUM 8.4* 8.4*   PT/INR No results for input(s): LABPROT, INR in the last 72 hours. ABG No results for input(s): PHART, HCO3 in the last 72 hours.  Invalid input(s): PCO2, PO2  Studies/Results: No results found.  Anti-infectives: Anti-infectives    Start     Dose/Rate Route Frequency Ordered Stop   11/15/14 0945  metroNIDAZOLE (FLAGYL) IVPB 500 mg     500 mg 100 mL/hr over 60 Minutes Intravenous On call to O.R. 11/14/14 1429 11/15/14 1054   11/15/14 0945  ciprofloxacin (CIPRO) IVPB 400 mg     400 mg 200 mL/hr over 60 Minutes Intravenous On call to O.R. 11/14/14 1429 11/15/14 1042      Assessment/Plan: s/p Procedure(s): SMALL BOWEL RESECTION (N/A)  Continue NPO and NG.  Hopefully it may be able to come out tomorrow zofran for nausea  LOS: 3 days    Dianara Smullen A 11/18/2014

## 2014-11-19 MED ORDER — TRAMADOL HCL 50 MG PO TABS: 25.0000 mg | ORAL_TABLET | Freq: Four times a day (QID) | ORAL | Status: DC | PRN

## 2014-11-19 MED ORDER — METOPROLOL SUCCINATE ER 50 MG PO TB24
50.0000 mg | ORAL_TABLET | Freq: Every day | ORAL | Status: DC
  Administered 2014-11-19 – 2014-11-20 (×2): 50 mg via ORAL
  Filled 2014-11-19 (×3): qty 1

## 2014-11-19 NOTE — Care Management Important Message (Signed)
Important Message  Patient Details  Name: Debra [name] MRN: [id number] Date of Birth: 12-14-1950   Medicare Important Message Given:  Yes-second notification given    Loann Quill 11/19/2014, 12:37 PM

## 2014-11-19 NOTE — Progress Notes (Signed)
4 Days Post-Op  Subjective: Passed flatus, nasal congestion from NGT  Objective: Vital signs in last 24 hours: Temp:  [98.2 F (36.8 C)-98.9 F (37.2 C)] 98.8 F (37.1 C) (09/12 0640) Pulse Rate:  [95-108] 95 (09/12 0640) Resp:  [16-17] 17 (09/12 0640) BP: (124-155)/(68-82) 124/68 mmHg (09/12 0640) SpO2:  [98 %-100 %] 100 % (09/12 0640) Last BM Date: 11/14/14  Intake/Output from previous day: 09/11 0701 - 09/12 0700 In: 1344 [I.V.:1254; NG/GT:90] Out: 2700 [Urine:1950; Emesis/NG output:750] Intake/Output this shift:    General appearance: cooperative Resp: clear to auscultation bilaterally Cardio: S1, S2 normal GI: soft, dried blood centrally on wound, +BS, ND  Lab Results:   Recent Labs  11/17/14 0345  WBC 7.9  HGB 11.2*  HCT 35.1*  PLT 176   BMET  Recent Labs  11/17/14 0345  NA 138  K 4.3  CL 105  CO2 29  GLUCOSE 142*  BUN <5*  CREATININE 0.62  CALCIUM 8.4*   PT/INR No results for input(s): LABPROT, INR in the last 72 hours. ABG No results for input(s): PHART, HCO3 in the last 72 hours.  Invalid input(s): PCO2, PO2  Studies/Results: No results found.  Anti-infectives: Anti-infectives    Start     Dose/Rate Route Frequency Ordered Stop   11/15/14 0945  metroNIDAZOLE (FLAGYL) IVPB 500 mg     500 mg 100 mL/hr over 60 Minutes Intravenous On call to O.R. 11/14/14 1429 11/15/14 1054   11/15/14 0945  ciprofloxacin (CIPRO) IVPB 400 mg     400 mg 200 mL/hr over 60 Minutes Intravenous On call to O.R. 11/14/14 1429 11/15/14 1042      Assessment/Plan: s/p Procedure(s): SMALL BOWEL RESECTION (N/A) POD#4 - D/C NGT and start clears HTN - lopressor IV, change to home dose FEN - above VTE - Lovenox  LOS: 4 days    Izabela Ow E 11/19/2014

## 2014-11-20 NOTE — Progress Notes (Addendum)
5 Days Post-Op  Subjective: Flatus, no BM. Not needing much pain medicine.  Objective: Vital signs in last 24 hours: Temp:  [97.9 F (36.6 C)-98.2 F (36.8 C)] 97.9 F (36.6 C) (09/13 0619) Pulse Rate:  [79-97] 97 (09/13 0619) Resp:  [16-18] 16 (09/13 0619) BP: (111-142)/(63-80) 142/80 mmHg (09/13 0619) SpO2:  [100 %] 100 % (09/13 0619) Last BM Date: 11/14/14  Intake/Output from previous day: 09/12 0701 - 09/13 0700 In: 1010 [P.O.:1010] Out: 900 [Urine:900] Intake/Output this shift:    General appearance: cooperative Resp: clear to auscultation bilaterally GI: soft, incision OK, active BS  Lab Results:  No results for input(s): WBC, HGB, HCT, PLT in the last 72 hours. BMET No results for input(s): NA, K, CL, CO2, GLUCOSE, BUN, CREATININE, CALCIUM in the last 72 hours. PT/INR No results for input(s): LABPROT, INR in the last 72 hours. ABG No results for input(s): PHART, HCO3 in the last 72 hours.  Invalid input(s): PCO2, PO2  Studies/Results: No results found.  Anti-infectives: Anti-infectives    Start     Dose/Rate Route Frequency Ordered Stop   11/15/14 0945  metroNIDAZOLE (FLAGYL) IVPB 500 mg     500 mg 100 mL/hr over 60 Minutes Intravenous On call to O.R. 11/14/14 1429 11/15/14 1054   11/15/14 0945  ciprofloxacin (CIPRO) IVPB 400 mg     400 mg 200 mL/hr over 60 Minutes Intravenous On call to O.R. 11/14/14 1429 11/15/14 1042      Assessment/Plan: s/p Procedure(s): SMALL BOWEL RESECTION (N/A)  LOS: 5 days  POD#5 - clears until BM HTN - home dose lopressor XL FEN - KVO IVF VTE - Lovenox  Donaldo Teegarden E 11/20/2014

## 2014-11-20 NOTE — Discharge Summary (Signed)
Physician Discharge Summary  Patient ID: DAKIA SCHIFANO MRN: [id number] DOB/AGE: [birth year] 66 y.o.  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 The tumor several centimeters proximal to her previous small bowel anastomosis was the cause of the partial obstruction. Additionally, a more proximal small bowel biopsy was taken. Pathology is listed as below. She had the expected postoperative ileus. Nasogastric tube was removed on postoperative day 4. Bowel function gradually returned and she tolerated advancement of her diet. Pain control is very good and she is discharged home on postoperative day 5.  Consults: None  Significant Diagnostic Studies: pathology -  1. Small Intestine Biopsy, Small bowel mass - BENIGN SMALL BOWEL. - NEGATIVE FOR EPITHELIAL DYSPLASIA OR MALIGNANCY. - NEGATIVE FOR NEUROENDOCRINE TUMOR. 2. 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 - POSITIVE FOR LYMPHOVASCULAR INVASION. - TUMOR INVOLVES SMALL BOWEL SEROSA. - FOUR LYMPH NODES, POSITIVE FOR METASTATIC TUMOR (4/11). - SURGICAL MARGIN, NEGATIVE FOR TUMOR.  Treatments: surgery: above  Discharge Exam: Blood pressure 118/61, pulse 96, temperature 98.3 F (36.8 C), temperature source Oral, resp. rate 16, weight 49.442 kg (109 lb), SpO2 100 %. See progress note  Disposition: 01-Home or Self Care  Discharge Instructions    Discharge instructions    Complete by:  As directed   [facility],  [place] [phone]  OPEN ABDOMINAL SURGERY: POST OP INSTRUCTIONS  Always review your discharge instruction sheet given to you by the facility where your surgery was performed.  IF YOU HAVE DISABILITY OR FAMILY LEAVE FORMS, YOU MUST BRING THEM TO THE OFFICE FOR PROCESSING.  PLEASE DO NOT GIVE THEM TO YOUR DOCTOR.  A prescription for pain medication may be given to you upon discharge.  Take your pain medication as prescribed, if needed.  If narcotic pain medicine is not needed, then you may take acetaminophen (Tylenol) or ibuprofen (Advil) as needed. Take your usually prescribed medications unless otherwise directed. If you need a refill on your pain medication, please contact your pharmacy. They will contact our office to request authorization.  Prescriptions will not be filled after 5pm or on week-ends. You should follow a light diet the first few days after arrival home, such as soup and crackers, pudding, etc.unless your doctor has advised otherwise. A high-fiber, low fat diet can be resumed as tolerated.   Be sure to include lots of fluids daily. Most patients will experience some swelling and bruising on the chest and neck area.  Ice packs will help.  Swelling and bruising can take several days to resolve Most patients will experience some swelling and bruising in the area of the incision. Ice pack will help. Swelling and bruising can take several days to resolve..  It is common to experience some constipation if taking pain medication after surgery.  Increasing fluid intake and taking a stool softener will usually help or prevent this problem from occurring.  A mild laxative (Milk of Magnesia or Miralax) should be taken according to package directions if there are no bowel movements after 48 hours.  You may have steri-strips (small skin tapes) in place directly over  the incision.  These strips should be left on the skin for 7-10 days.  If your surgeon used skin glue on the incision, you may shower  in 24 hours.  The glue will flake off over the next 2-3 weeks.  Any sutures or staples will be removed at the office during your follow-up visit. You may find that a light gauze bandage over your incision may keep your staples from being rubbed or pulled. You may shower and replace the bandage daily. ACTIVITIES:  You may resume regular (light) daily activities beginning the next day-such as daily self-care, walking, climbing stairs-gradually increasing activities as tolerated.  You may have sexual intercourse when it is comfortable.  Refrain from any heavy lifting or straining until approved by your doctor. You may drive when you no longer are taking prescription pain medication, you can comfortably wear a seatbelt, and you can safely maneuver your car and apply brakes Return to Work: ___________________________________ Dennis Bast should see your doctor in the office for a follow-up appointment approximately two weeks after your surgery.  Make sure that you call for this appointment within a day or two after you arrive home to insure a convenient appointment time. OTHER INSTRUCTIONS:  _____________________________________________________________ _____________________________________________________________  WHEN TO CALL YOUR DOCTOR: Fever over 101.0 Inability to urinate Nausea and/or vomiting Extreme swelling or bruising Continued bleeding from incision. Increased pain, redness, or drainage from the incision. Difficulty swallowing or breathing Muscle cramping or spasms. Numbness or tingling in hands or feet or around lips.  The clinic staff is available to answer your questions during regular business hours.  Please don't hesitate to call and ask to speak to one of the nurses if you have concerns.  For further questions, please visit [email or web address]     Increase activity slowly    Complete by:  As directed      Lifting restrictions    Complete by:  As directed   Do not lift over 10  pounds for 6 weeks     No dressing needed    Complete by:  As directed             Medication List    TAKE these medications        aspirin 81 MG tablet  Take 81 mg by mouth daily.     metoprolol succinate 50 MG 24 hr tablet  Commonly known as:  TOPROL-XL  TAKE 1 TABLET (50 MG TOTAL) BY MOUTH DAILY. TAKE WITH OR IMMEDIATELY FOLLOWING A MEAL.     multivitamin with minerals tablet  Take 1 tablet by mouth daily.     simvastatin 20 MG tablet  Commonly known as:  ZOCOR  Take 1 tablet (20 mg total) by mouth daily.     traMADol 50 MG tablet  Commonly known as:  ULTRAM  Take 25 mg by mouth every 6 (six) hours as needed for moderate pain. Takes half a tablet           Follow-up Information    Follow up with Zenovia Jarred, MD On 12/20/2014.   Specialty:  General Surgery   Why:  For wound re-check. 4pm. My office will confirm appointment.   Contact information:   [place] [address] [phone]       Signed: Zenovia Jarred 11/20/2014, 2:28 PM

## 2014-11-20 NOTE — Progress Notes (Signed)
Nutrition Education Note  RD met with patient to discuss nutrition therapy post small bowel surgery per patient request. Pt states that after previous bowel surgery, she continued to have abdominal pain and intermitted diarrhea.    RD provided "Low Fiber Nutrition Therapy" handout as well as "5 Sample Menus for Gradually Increasing Fiber" handout from the Academy of [employer] and Dietetics. Reviewed patient's dietary recall and discussed ways for pt to meet nutrition goals over the next few weeks. Explained reasons for pt to follow a low fiber diet over the next 1-2 weeks. Reviewed low fiber foods and high fiber foods. Discussed foods to eat to help manage diarrhea. Encouraged patient to gradually add higher fiber foods back into her diet.   Teach back method used. Pt verbalizes understanding of information provided.   Expect very good compliance. RD name and contact information provided.   Current diet order is Vegetarian. NGT was removed yesterday and diet was advanced from clear liquids just before RD visit. Pt reports feeling well and having a good appetite. She hopes to eat lunch and go home later today. RD will continue to monitor PO tolerance.    Scarlette Ar RD, LDN Inpatient Clinical Dietitian Pager: [phone] After Hours Pager: [phone]

## 2014-11-26 ENCOUNTER — Telehealth: Payer: Self-pay | Admitting: *Deleted

## 2014-11-26 NOTE — Telephone Encounter (Deleted)
-----   Message from Debra Pier, MD sent at 11/25/2014  1:04 PM EDT ----- Put her in [place] 9/23 if he is coming for appt. 9/19 ----- Message -----    From: Debra Skeans, MD    Sent: 11/20/2014   2:33 PM      To: Debra Pier, MD  [name], I operated on [name] again. I sent you copies of her pathology and discharge summary. I think she has appointment to see you in December but would like to see you sooner. She may also seek a second opinion from a [facility]. She is looking into that right now but does want to see you first. She is doing well postoperatively. Thanks. [name]

## 2014-11-26 NOTE — Telephone Encounter (Signed)
Called pt, offered appointment for this afternoon. Pt declined, will not be able to come in. Had surgery 9/8. Follow up with Dr. Benay Spice has been scheduled for 10/17. Pt asks to move to a Thurs or Fri if possible so husband may attend. Will call her if anything opens up on those days.

## 2014-11-28 ENCOUNTER — Ambulatory Visit (INDEPENDENT_AMBULATORY_CARE_PROVIDER_SITE_OTHER): Payer: Medicare Other | Admitting: Cardiology

## 2014-11-28 ENCOUNTER — Encounter: Payer: Self-pay | Admitting: Cardiology

## 2014-11-28 VITALS — BP 120/72 | HR 67 | Ht 62.0 in | Wt 107.8 lb

## 2014-11-28 DIAGNOSIS — I712 Thoracic aortic aneurysm, without rupture, unspecified: Secondary | ICD-10-CM

## 2014-11-28 DIAGNOSIS — I10 Essential (primary) hypertension: Secondary | ICD-10-CM | POA: Diagnosis not present

## 2014-11-28 DIAGNOSIS — E7800 Pure hypercholesterolemia, unspecified: Secondary | ICD-10-CM

## 2014-11-28 DIAGNOSIS — I2510 Atherosclerotic heart disease of native coronary artery without angina pectoris: Secondary | ICD-10-CM

## 2014-11-28 DIAGNOSIS — I77810 Thoracic aortic ectasia: Secondary | ICD-10-CM

## 2014-11-28 DIAGNOSIS — I359 Nonrheumatic aortic valve disorder, unspecified: Secondary | ICD-10-CM

## 2014-11-28 LAB — HEPATIC FUNCTION PANEL
ALK PHOS: 87 U/L (ref 39–117)
ALT: 13 U/L (ref 0–35)
AST: 17 U/L (ref 0–37)
Albumin: 3.7 g/dL (ref 3.5–5.2)
BILIRUBIN DIRECT: 0.1 mg/dL (ref 0.0–0.3)
BILIRUBIN TOTAL: 0.6 mg/dL (ref 0.2–1.2)
Total Protein: 6.1 g/dL (ref 6.0–8.3)

## 2014-11-28 NOTE — Progress Notes (Signed)
Cardiology Office Note   Date:  11/28/2014   ID:  Debra [name], DOB 1951-01-31, MRN [id number]  PCP:  Reginia Naas, MD    Chief Complaint  Patient presents with  . Atherosclerosis      History of Present Illness: Debra [name] is a 64 y.o. female with a history of thoracic aortic aneurysm followed by Dr. Prescott Gum, nonobstructive ASCAD, HTN, bicuspid AV with mild AS and dyslipidemia who presents today for followup. She is doing well today. She denies any chest pain, SOB, DOE, LE edema, dizziness, palpitations or syncope.     Past Medical History  Diagnosis Date  . Coronary artery disease     nonobstructive  . H/O echocardiogram     bicuspid aortic valve with moderate aortic valve sclerosis,mild PR.TR.MR and mildly dilated aorta by echo on 8.2012  . Mild mitral regurgitation   . Hypertension   . Heart murmur   . Aortic aneurysm   . Thoracic aortic aneurysm     mild, 4.1 cm by recent CT followed by Dr Darcey Nora  . Kidney stones     "passed them"  . Iron deficiency anemia     "can't tolerate the supplements right now" (11/15/2014)  . Arthritis     "moderate in back" (11/15/2014)  . Colon cancer   . Carcinoid tumor of small intestine, malignant dx'd 05/2014    Past Surgical History  Procedure Laterality Date  . Colonoscopy  10/15/11    diverticulosis present, mild severity, no polyps, repeat in 10 years.  . Abdominal hysterectomy    . Tonsillectomy    . Laparoscopic lysis of adhesions N/A 05/14/2014    Procedure: LAPAROSCOPIC LYSIS OF ADHESIONS x 64mins;  Surgeon: [name], MD;  Location: [facility];  Service: General;  Laterality: N/A;  . Laparoscopic small bowel resection N/A 05/14/2014    Procedure: LAPAROSCOPIC ASSISTED SMALL BOWEL RESECTION;  Surgeon: [name], MD;  Location: [place];  Service: General;  Laterality: N/A;  . Colon surgery    . Small intestine surgery  11/15/2014  . Cataract extraction w/ intraocular lens  implant    . Bowel resection N/A 11/15/2014    Procedure: SMALL BOWEL RESECTION;  Surgeon: [name], MD;  Location: [facility];  Service: General;  Laterality: N/A;     Current Outpatient Prescriptions  Medication Sig Dispense Refill  . aspirin 81 MG tablet Take 81 mg by mouth daily.    . metoprolol succinate (TOPROL-XL) 50 MG 24 hr tablet Take 50 mg by mouth every evening. Take with or immediately following a meal.    . simvastatin (ZOCOR) 20 MG tablet Take 20 mg by mouth every evening.    . traMADol (ULTRAM) 50 MG tablet Take 25 mg by mouth every 6 (six) hours as needed for moderate pain. Takes half a tablet     No current facility-administered medications for this visit.    Allergies:   Codeine; Opium; and Penicillins    Social History:  The patient  reports that she has never smoked. She has never used smokeless tobacco. She reports that she drinks about 2.4 oz of alcohol per week. She reports that she does not use illicit drugs.   Family History:  The patient's family history includes Breast cancer in her mother; CAD in her maternal grandfather, paternal grandfather, and paternal grandmother; Cancer in her father; Diabetes in her father.    ROS:  Please see the history of present illness.   Otherwise, review of systems are positive for none.   All other systems are reviewed and negative.    PHYSICAL EXAM: VS:  BP 120/72 mmHg  Pulse 67  Ht 5\' 2"  (1.575 m)  Wt 107 lb 12.8 oz (48.898 kg)  BMI 19.71 kg/m2 , BMI Body mass index is 19.71 kg/(m^2). GEN: Well nourished, well developed, in no acute distress HEENT: normal Neck: no JVD, carotid bruits, or masses Cardiac:RRR; no  rubs, or gallops,no edema.  2/6 mid systlic murmur at RUSB to LLSB Respiratory:  clear to auscultation bilaterally, normal work of breathing GI: soft, nontender, nondistended, + BS MS: no deformity or atrophy Skin: warm and dry, no rash Neuro:  Strength and sensation are intact Psych: euthymic mood, full  affect   EKG:  EKG is ordered today. The ekg ordered today demonstrates NSR with no ST changes   Recent Labs: 06/19/2014: ALT 12; Magnesium 1.9 11/17/2014: BUN <5*; Creatinine, Ser 0.62; Hemoglobin 11.2*; Platelets 176; Potassium 4.3; Sodium 138    Lipid Panel    Component Value Date/Time   CHOL 141 02/19/2014 0740   TRIG 92 02/19/2014 0740   HDL 69 02/19/2014 0740   LDLCALC 54 02/19/2014 0740      Wt Readings from Last 3 Encounters:  11/28/14 107 lb 12.8 oz (48.898 kg)  11/15/14 109 lb (49.442 kg)  11/13/14 109 lb 2 oz (49.5 kg)    ASSESSMENT AND PLAN:  1. ASCAD with no angina - continue ASA 2. HTN - well controlled - continue metoprolol 3. Dyslipidemia  - will check FLP and ALT - continue simvastatin 4. Thoracic aortic aneurysm followed by Dr. Prescott Gum 5. Bicuspid aortic valve with mild AS - recheck 2D echo   Current medicines are reviewed at length with the patient today.  The patient does not have concerns regarding medicines.  The following changes have been made:  no change  Labs/ tests ordered today: See above Assessment and Plan No orders of the defined types were placed in this encounter.     Disposition:   FU with me in 1 year  Signed, Sueanne Margarita, MD  11/28/2014 8:38 AM    [facility] [address] [place], [place], [place]  [address] Phone: [phone]; Fax: [phone]

## 2014-11-28 NOTE — Patient Instructions (Signed)
Medication Instructions:  Your physician recommends that you continue on your current medications as directed. Please refer to the Current Medication list given to you today.   Labwork: TODAY: LFTs, Lipids  Testing/Procedures: Your physician has requested that you have an echocardiogram. Echocardiography is a painless test that uses sound waves to create images of your heart. It provides your doctor with information about the size and shape of your heart and how well your heart's chambers and valves are working. This procedure takes approximately one hour. There are no restrictions for this procedure.  Follow-Up: You have been referred to Dr. Prescott Gum for evaluation of thoracic aortic aneurysm.   Your physician wants you to follow-up in: 1 year with Dr. Radford Pax. You will receive a reminder letter in the mail two months in advance. If you don't receive a letter, please call our office to schedule the follow-up appointment.   Any Other Special Instructions Will Be Listed Below (If Applicable).

## 2014-12-03 ENCOUNTER — Ambulatory Visit (HOSPITAL_COMMUNITY): Payer: Medicare Other | Attending: Cardiology

## 2014-12-03 ENCOUNTER — Other Ambulatory Visit: Payer: Self-pay

## 2014-12-03 DIAGNOSIS — I340 Nonrheumatic mitral (valve) insufficiency: Secondary | ICD-10-CM | POA: Diagnosis not present

## 2014-12-03 DIAGNOSIS — I352 Nonrheumatic aortic (valve) stenosis with insufficiency: Secondary | ICD-10-CM | POA: Diagnosis not present

## 2014-12-03 DIAGNOSIS — I77810 Thoracic aortic ectasia: Secondary | ICD-10-CM

## 2014-12-03 DIAGNOSIS — I359 Nonrheumatic aortic valve disorder, unspecified: Secondary | ICD-10-CM | POA: Diagnosis not present

## 2014-12-03 DIAGNOSIS — E785 Hyperlipidemia, unspecified: Secondary | ICD-10-CM | POA: Diagnosis not present

## 2014-12-03 DIAGNOSIS — I10 Essential (primary) hypertension: Secondary | ICD-10-CM | POA: Diagnosis not present

## 2014-12-03 DIAGNOSIS — I350 Nonrheumatic aortic (valve) stenosis: Secondary | ICD-10-CM | POA: Diagnosis present

## 2014-12-04 ENCOUNTER — Telehealth: Payer: Self-pay | Admitting: *Deleted

## 2014-12-04 DIAGNOSIS — I359 Nonrheumatic aortic valve disorder, unspecified: Secondary | ICD-10-CM

## 2014-12-04 NOTE — Telephone Encounter (Signed)
Pt notified of echo results by phone with verbal understanding. Pt agreeable to plan of care to repeat echo in 1 yr.

## 2014-12-11 ENCOUNTER — Telehealth: Payer: Self-pay | Admitting: *Deleted

## 2014-12-11 NOTE — Telephone Encounter (Signed)
Called pt with appointment for 10/14. She declined, will be in [place] that day. She will keep 10/17 as scheduled.

## 2014-12-18 ENCOUNTER — Other Ambulatory Visit: Payer: Self-pay

## 2014-12-18 DIAGNOSIS — Z1231 Encounter for screening mammogram for malignant neoplasm of breast: Secondary | ICD-10-CM

## 2014-12-24 ENCOUNTER — Ambulatory Visit (HOSPITAL_BASED_OUTPATIENT_CLINIC_OR_DEPARTMENT_OTHER): Payer: Medicare Other | Admitting: Oncology

## 2014-12-24 ENCOUNTER — Telehealth: Payer: Self-pay | Admitting: Oncology

## 2014-12-24 ENCOUNTER — Encounter: Payer: Self-pay | Admitting: *Deleted

## 2014-12-24 ENCOUNTER — Other Ambulatory Visit: Payer: Self-pay | Admitting: Cardiology

## 2014-12-24 VITALS — BP 126/69 | HR 78 | Temp 98.3°F | Resp 17 | Ht 62.0 in | Wt 110.5 lb

## 2014-12-24 DIAGNOSIS — C7A012 Malignant carcinoid tumor of the ileum: Secondary | ICD-10-CM | POA: Diagnosis not present

## 2014-12-24 DIAGNOSIS — C7A019 Malignant carcinoid tumor of the small intestine, unspecified portion: Secondary | ICD-10-CM

## 2014-12-24 DIAGNOSIS — Z23 Encounter for immunization: Secondary | ICD-10-CM

## 2014-12-24 DIAGNOSIS — I712 Thoracic aortic aneurysm, without rupture: Secondary | ICD-10-CM | POA: Diagnosis not present

## 2014-12-24 DIAGNOSIS — D509 Iron deficiency anemia, unspecified: Secondary | ICD-10-CM | POA: Diagnosis not present

## 2014-12-24 MED ORDER — INFLUENZA VAC SPLIT QUAD 0.5 ML IM SUSY
0.5000 mL | PREFILLED_SYRINGE | Freq: Once | INTRAMUSCULAR | Status: AC
  Administered 2014-12-24: 0.5 mL via INTRAMUSCULAR
  Filled 2014-12-24: qty 0.5

## 2014-12-24 NOTE — Telephone Encounter (Signed)
Spoke with patient she is aware of her lab appointment

## 2014-12-24 NOTE — Progress Notes (Signed)
[facility] OFFICE PROGRESS NOTE   Diagnosis: Carcinoid tumor  INTERVAL HISTORY:   Debra [name] had persistent abdominal pain and iron deficiency after surgery for carcinoid tumors earlier this year. She was evaluated by Dr. Watt Climes and a CT in progress on 10/03/2014 revealed dilatation of the mid and distal small bowel with the possibility of a malignant stricture in a segment of distal bowel. Numerous borderline enlarged mesenteric nodes were seen. An octreotide scan on 10/17/2014 revealed increased uptake corresponding to the location of enhancing and enlarged mesenteric node. No evidence of metastatic disease to the liver. She was referred to Dr. [name] and was taken to the operating room on 11/15/2014 for a small bowel resection. A small bowel intraluminal mass was noted proximal to the anastomosis causing a partial obstruction. An additional small bowel mass was noted more proximally and a third small bowel mass distal to the anastomosis. Small bowel mesenteric lymphadenopathy was seen. All 3 tumors were resected in addition to the palpable lymphadenopathy. The liver felt normal.  The pathology ([id number]) confirmed a multifocal moderately to appreciated neuroendocrine tumor. Lymphovascular invasion was present in tumor involved the small bowel serosa. 4 of 11 lymph nodes were positive for metastatic tumor. Surgical margins were negative. 4 separate neuroendocrine tumors were noted. This tumors were intermediate grade, grade 2.  She reports feeling the best that she has in the past year. The abdominal pain has resolved. She denies diarrhea and flushing.   Objective:  Vital signs in last 24 hours:  Blood pressure 126/69, pulse 78, temperature 98.3 F (36.8 C), resp. rate 17, height 5\' 2"  (1.575 m), weight 110 lb 8 oz (50.122 kg), SpO2 100 %.    HEENT: Neck without mass Lymphatics: No cervical, supra-clavicular, axillary, or inguinal nodes Resp: Lungs clear  bilaterally Cardio: Regular rate and rhythm, 2/6 systolic murmur GI: No hepatosplenomegaly, no mass, healed surgical incision Vascular: No leg edema   Lab Results:  Lab Results  Component Value Date   WBC 7.9 11/17/2014   HGB 11.2* 11/17/2014   HCT 35.1* 11/17/2014   MCV 82.8 11/17/2014   PLT 176 11/17/2014   NEUTROABS 4.2 07/05/2014   Chromogranin A on 428 2016-9  Medications: I have reviewed the patient's current medications.  Assessment/Plan: 1. Carcinoid tumors involving the the ileum, status post a small bowel resection 05/14/2014 with 1.4 cm and 1.1 cm tumors noted,T4Nx  The larger tumor showed focal involvement of the serosal surface, resection margins negative, lymphovascular invasion present  Persistent abdominal pain and nausea, CT and drug or free confirmed evidence of a distal small bowel obstruction 10/03/2014 with an octreotide scan 10/17/2014 confirming positive uptake in a mesenteric lymph node  Small bowel resection 11/15/2014 confirmed multiple small bowel neuroendocrine tumors, grade 2, with metastatic tumor involving 4 lymph nodes   2. History of intermittent muscle pain and nausea/vomiting secondary to bowel obstruction from #1  3. Microcytic anemia-secondary to iron deficiency, likely secondary to bleeding from the small bowel neuroendocrine tumors  4. Thoracic Aortic aneurysm  5. Bicuspid aortic valve     Disposition:  Ms. Scheib has recovered from the recent surgery. Her clinical presentation is most consistent with multiple small bowel carcinoid tumors. I explained the tumors noted on the 11/15/2014 resection more likely present in March. She has been diagnosed with multifocal carcinoid tumors involving the small bowel and mesenteric lymph nodes. Ms. Akard appears asymptomatic at present. There is no clinical or x-ray evidence of distant metastatic disease. She has a significant  chance of developing a recurrent neuroendocrine tumor in  the future. There is no indication for "adjuvant" therapy in her case. We discussed the indication for surveillance CT scans. I explained that I generally do not recommend these in patients with neuroendocrine tumors secondary to the lack of a proven survival benefit. I typically follow the clinical status and the serum chromogranin A level.  She is being scheduled to see Dr. Leamon Arnt for an expert opinion. She will return for an office visit and chromogranin A level in 3 months. We will check the hemoglobin when she returns in 3 months.    Betsy Coder, MD  12/24/2014  4:38 PM

## 2014-12-24 NOTE — Progress Notes (Signed)
Oncology Nurse Navigator Documentation  Oncology Nurse Navigator Flowsheets 12/24/2014  Navigator Encounter Type 4 month f/u   Patient Visit Type Medonc  Treatment Phase 2nd carcinoid found; s/p surgery  Barriers/Navigation Needs No barriers at this time  Interventions Education Method--following dx w/chromogranin testing every 6 months; s/s of liver mets  Education Method Verbal;Teach-back  Time Spent with Patient 20

## 2014-12-27 DIAGNOSIS — D3A019 Benign carcinoid tumor of the small intestine, unspecified portion: Secondary | ICD-10-CM | POA: Diagnosis not present

## 2015-01-08 DIAGNOSIS — C7B8 Other secondary neuroendocrine tumors: Secondary | ICD-10-CM | POA: Diagnosis not present

## 2015-01-20 ENCOUNTER — Other Ambulatory Visit: Payer: Self-pay | Admitting: Cardiology

## 2015-02-20 ENCOUNTER — Other Ambulatory Visit: Payer: Self-pay | Admitting: *Deleted

## 2015-02-20 ENCOUNTER — Telehealth: Payer: Self-pay | Admitting: Oncology

## 2015-02-20 NOTE — Telephone Encounter (Signed)
pt cld to r/s appt stated had 2 surgeries and should see Dr Benay Spice in [place] appt was in Dec-pt req to move labs and appt to Jan-gave r/s appt time & date

## 2015-02-21 ENCOUNTER — Other Ambulatory Visit: Payer: Medicare Other

## 2015-02-27 ENCOUNTER — Other Ambulatory Visit: Payer: Self-pay | Admitting: *Deleted

## 2015-02-27 DIAGNOSIS — I712 Thoracic aortic aneurysm, without rupture: Secondary | ICD-10-CM

## 2015-02-27 DIAGNOSIS — I7121 Aneurysm of the ascending aorta, without rupture: Secondary | ICD-10-CM

## 2015-02-28 ENCOUNTER — Ambulatory Visit: Payer: Medicare Other | Admitting: Oncology

## 2015-03-07 ENCOUNTER — Other Ambulatory Visit: Payer: Self-pay | Admitting: *Deleted

## 2015-03-07 DIAGNOSIS — I712 Thoracic aortic aneurysm, without rupture, unspecified: Secondary | ICD-10-CM

## 2015-03-13 ENCOUNTER — Ambulatory Visit
Admission: RE | Admit: 2015-03-13 | Discharge: 2015-03-13 | Disposition: A | Discharge status: Home / Self Care | Payer: Medicare Other | Source: Ambulatory Visit

## 2015-03-13 DIAGNOSIS — Z1231 Encounter for screening mammogram for malignant neoplasm of breast: Secondary | ICD-10-CM

## 2015-03-13 DIAGNOSIS — I712 Thoracic aortic aneurysm, without rupture: Secondary | ICD-10-CM | POA: Diagnosis not present

## 2015-03-13 LAB — CREATININE, ISTAT: Creatinine, IStat: 0.6 mg/dL (ref 0.6–1.3)

## 2015-03-20 ENCOUNTER — Encounter: Payer: Self-pay | Admitting: Cardiothoracic Surgery

## 2015-03-20 ENCOUNTER — Ambulatory Visit
Admission: RE | Admit: 2015-03-20 | Discharge: 2015-03-20 | Disposition: A | Discharge status: Home / Self Care | Payer: Medicare Other | Source: Ambulatory Visit | Attending: Cardiothoracic Surgery | Admitting: Cardiothoracic Surgery

## 2015-03-20 ENCOUNTER — Ambulatory Visit (INDEPENDENT_AMBULATORY_CARE_PROVIDER_SITE_OTHER): Payer: Medicare Other | Admitting: Cardiothoracic Surgery

## 2015-03-20 VITALS — BP 138/83 | HR 69 | Resp 16 | Ht 62.0 in | Wt 110.0 lb

## 2015-03-20 DIAGNOSIS — I7121 Aneurysm of the ascending aorta, without rupture: Secondary | ICD-10-CM

## 2015-03-20 DIAGNOSIS — I712 Thoracic aortic aneurysm, without rupture, unspecified: Secondary | ICD-10-CM

## 2015-03-20 MED ORDER — IOPAMIDOL (ISOVUE-370) INJECTION 76%
75.0000 mL | Freq: Once | INTRAVENOUS | Status: AC | PRN
  Administered 2015-03-20: 75 mL via INTRAVENOUS

## 2015-03-24 NOTE — Progress Notes (Signed)
PCP is Reginia Naas, MD Referring Provider is Carol Ada, MD  Chief Complaint  Patient presents with  . Thoracic Aortic Aneurysm    18 month f/u with CTA Chest    [id number] returns for followup of her fusiform ascending thoracic aneurysm which has been stable at 4.1 cm for several years. She remains asymptomatic. She also has a known bicuspid aortic valve without stenosis.an echocardiogram was performed September 2016 showing normal LV function and a bicuspid aortic valve with mild aortic stenosis and insufficiency.  The patient has well-controlled hypertension taking Toprol-XL 50 mg daily and lipid control with Zocor 20 mg daily  Earlier this year the patient underwent laparotomy twice for resection of carcinoid tumor of the small intestine. There was some lymph node involvement. There was no hepatic involvement. She is followed by Dr. Julieanne Manson. She had no cardiac or basher complications from these operations.  CT scan performed earlier this month shows stable fusiform ascending aneurysm measuring 4.1 cm in diameter without hematoma or ulceration. She is at very low risk for dissection. Surgery is not indicated until diameter exceed 5.5 cm. Past Medical History  Diagnosis Date  . Coronary artery disease     nonobstructive  . H/O echocardiogram     bicuspid aortic valve with moderate aortic valve sclerosis,mild PR.TR.MR and mildly dilated aorta by echo on 8.2012  . Mild mitral regurgitation   . Hypertension   . Heart murmur   . Aortic aneurysm ([employer] [facility])   . Thoracic aortic aneurysm ([facility])     mild, 4.1 cm by recent CT followed by Dr Darcey Nora  . Kidney stones     "passed them"  . Iron deficiency anemia     "can't tolerate the supplements right now" (11/15/2014)  . Arthritis     "moderate in back" (11/15/2014)  . Colon cancer ([facility])   . 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 Colonoscopy   10/15/11    diverticulosis present, mild severity, no polyps, repeat in 10 years.  . Abdominal hysterectomy    . Tonsillectomy    . Laparoscopic lysis of adhesions N/A 05/14/2014    Procedure: LAPAROSCOPIC LYSIS OF ADHESIONS x 70mins;  Surgeon: [name], MD;  Location: [facility];  Service: General;  Laterality: N/A;  . Laparoscopic small bowel resection N/A 05/14/2014    Procedure: LAPAROSCOPIC ASSISTED SMALL BOWEL RESECTION;  Surgeon: [name], MD;  Location: [facility];  Service: General;  Laterality: N/A;  . Colon surgery    . Small intestine surgery  11/15/2014  . Cataract extraction w/ intraocular lens implant    . Bowel resection N/A 11/15/2014    Procedure: SMALL BOWEL RESECTION;  Surgeon: [name], MD;  Location: [facility] OR;  Service: General;  Laterality: N/A;    Family History  Problem Relation Age of Onset  . CAD Maternal Grandfather   . CAD Paternal Grandmother   . CAD Paternal Grandfather   . Breast cancer Mother   . Cancer Father   . Diabetes Father     Social History Social History  Substance Use Topics  . Smoking status: Never Smoker   . Smokeless tobacco: Never Used  . Alcohol Use: 2.4 oz/week    4 Glasses of wine per week    Current Outpatient Prescriptions  Medication Sig Dispense Refill  . aspirin 81 MG tablet Take 81 mg by mouth daily.    . metoprolol succinate (TOPROL-XL) 50 MG 24  hr tablet TAKE 1 TABLET (50 MG TOTAL) BY MOUTH DAILY. TAKE WITH OR IMMEDIATELY FOLLOWING A MEAL. 90 tablet 2  . simvastatin (ZOCOR) 20 MG tablet TAKE 1 TABLET BY MOUTH EVERY DAY 90 tablet 0   No current facility-administered medications for this visit.    Allergies  Allergen Reactions  . Codeine Anaphylaxis  . Opium Anaphylaxis  . Penicillins Anaphylaxis    Review of Systems         Review of Systems :  [ y ] = yes, [  ] = no        General :  Weight gain [   ]    Weight loss  Yes associated with abdominal surgery]  Fatigue [  ]  Fever [  ]  Chills  [  ]                                 Weakness  [  ]           Cardiac :  Chest pain/ pressure [  ]  Resting SOB [  ] exertional SOB [  ]                        Orthopnea [  ]  Pedal edema  [  ]  Palpitations [  ] Syncope/presyncope [ ]                         Paroxysmal nocturnal dyspnea [  ]        Pulmonary : cough [  ]  wheezing [  ]  Hemoptysis [  ] Sputum [  ] Snoring [  ]                              Pneumothorax [  ]  Sleep apnea [  ]       GI : Vomiting [  ]  Dysphagia [  ]  Melena  [  ]  Abdominal pain [  ] BRBPR [  ]              Heart burn [  ]  Constipation [  ] Diarrhea  [  ] Colonoscopy [  ]       GU : Hematuria [  ]  Dysuria [  ]  Nocturia [  ] UTI's [  ]       Vascular : Claudication [  ]  Rest pain [  ]  DVT [  ] Vein stripping [  ] leg ulcers [  ]                          TIA [  ] Stroke [  ]  Varicose veins [  ]       NEURO :  Headaches  [  ] Seizures [  ] Vision changes [  ] Paresthesias [  ]       Musculoskeletal :  Arthritis [  ] Gout  [  ]  Back pain [  ]  Joint pain [  ]       Skin :  Rash [  ]  Melanoma [  ]        Heme : Bleeding problems [  ]Clotting Disorders [  ]  Anemia [  ]Blood Transfusion [ ]        Endocrine : Diabetes [  ] Thyroid Disorder  [  ]       Psych : Depression [  ]  Anxiety [  ]  Psych hospitalizations [ yes for abdominal surgery-carcinoid tumor of small intestine ]                                               BP 138/83 mmHg  Pulse 69  Resp 16  Ht 5\' 2"  (1.575 m)  Wt 110 lb (49.896 kg)  BMI 20.11 kg/m2  SpO2 99% Physical Exam      Physical Exam  General: pleasant middle-aged Caucasian female no acute distress well-nourished and healthy appearing HEENT: Normocephalic pupils equal , dentition adequate Neck: Supple without JVD, adenopathy, or bruit Chest: Clear to auscultation, symmetrical breath sounds, no rhonchi, no tenderness             or deformity Cardiovascular: Regular rate and rhythm, no murmur, no gallop, peripheral pulses             palpable  in all extremities Abdomen:  Soft, nontender, no palpable mass or organomegaly Extremities: Warm, well-perfused, no clubbing cyanosis edema or tenderness,              no venous stasis changes of the legs Rectal/GU: Deferred Neuro: Grossly non--focal and symmetrical throughout Skin: Clean and dry without rash or ulceration   Diagnostic Tests: CTA images of the thoracic aorta discussed and counseled with patient. There's been no increase in the size of her ascending thoracic aorta. It measures 4.1 cm. No new*pulmonary nodules or mediastinal adenopathy.  Impression: Stable mild fusiform dilatation - aneurysm of the ascending aorta. No indication for surgery. Continue with good blood pressure control and lipid management.  Plan:repeat imaging of the ascending aorta with MRI-MRA in 18 months in order to reduce exposure to radiation. Continue current medications and activity limitations of heavy lifting.   Len Childs, MD [facility] [phone]

## 2015-03-26 ENCOUNTER — Other Ambulatory Visit: Payer: Medicare Other

## 2015-03-29 ENCOUNTER — Other Ambulatory Visit (HOSPITAL_BASED_OUTPATIENT_CLINIC_OR_DEPARTMENT_OTHER): Payer: Medicare Other

## 2015-03-29 ENCOUNTER — Ambulatory Visit (HOSPITAL_BASED_OUTPATIENT_CLINIC_OR_DEPARTMENT_OTHER): Payer: Medicare Other | Admitting: Oncology

## 2015-03-29 VITALS — BP 157/69 | HR 70 | Temp 98.0°F | Resp 18 | Ht 62.0 in | Wt 115.0 lb

## 2015-03-29 DIAGNOSIS — C7A019 Malignant carcinoid tumor of the small intestine, unspecified portion: Secondary | ICD-10-CM

## 2015-03-29 DIAGNOSIS — C7A098 Malignant carcinoid tumors of other sites: Secondary | ICD-10-CM | POA: Diagnosis not present

## 2015-03-29 DIAGNOSIS — D509 Iron deficiency anemia, unspecified: Secondary | ICD-10-CM

## 2015-03-29 LAB — CBC WITH DIFFERENTIAL/PLATELET
BASO%: 0.6 % (ref 0.0–2.0)
BASOS ABS: 0 10*3/uL (ref 0.0–0.1)
EOS ABS: 0.1 10*3/uL (ref 0.0–0.5)
EOS%: 1 % (ref 0.0–7.0)
HEMATOCRIT: 41.3 % (ref 34.8–46.6)
HEMOGLOBIN: 13.2 g/dL (ref 11.6–15.9)
LYMPH#: 1.6 10*3/uL (ref 0.9–3.3)
LYMPH%: 28.8 % (ref 14.0–49.7)
MCH: 26.5 pg (ref 25.1–34.0)
MCHC: 31.9 g/dL (ref 31.5–36.0)
MCV: 83.2 fL (ref 79.5–101.0)
MONO#: 0.6 10*3/uL (ref 0.1–0.9)
MONO%: 10.9 % (ref 0.0–14.0)
NEUT%: 58.7 % (ref 38.4–76.8)
NEUTROS ABS: 3.3 10*3/uL (ref 1.5–6.5)
Platelets: 226 10*3/uL (ref 145–400)
RBC: 4.97 10*6/uL (ref 3.70–5.45)
RDW: 14.7 % — AB (ref 11.2–14.5)
WBC: 5.6 10*3/uL (ref 3.9–10.3)

## 2015-03-29 NOTE — Progress Notes (Signed)
  [facility] OFFICE PROGRESS NOTE   Diagnosis:  Carcinoid tumor  INTERVAL HISTORY:    Debra [name] returns as scheduled. She feels well. No diarrhea. She reports feeling much better following the second surgery. She saw Dr. Leamon Arnt for a second opinion. He agrees with observation. She reports no laboratory studies were obtained at [facility].  Objective:  Vital signs in last 24 hours:  Blood pressure 157/69, pulse 70, temperature 98 F (36.7 C), temperature source Oral, resp. rate 18, height 5\' 2"  (1.575 m), weight 115 lb (52.164 kg), SpO2 100 %.    HEENT:  Neck without mass Lymphatics:  No cervical, supra-clavicular, or inguinal nodes Resp:  Scattered mild inspiratory wheeze at the posterior chest, no respiratory distress Cardio:  Regular rate and rhythm GI:  No hepatomegaly, nontender Vascular:  No leg edema   Lab Results:   chromogranin A-pending   hemolymph 13.2, hematocrit 41.3%, MCV 83.2  Medications: I have reviewed the patient's current medications.  Assessment/Plan: 1. Carcinoid tumors involving the the ileum, status post a small bowel resection 05/14/2014 with 1.4 cm and 1.1 cm tumors noted,T4Nx  The larger tumor showed focal involvement of the serosal surface, resection margins negative, lymphovascular invasion present  Persistent abdominal pain and nausea, CT and drug or free confirmed evidence of a distal small bowel obstruction 10/03/2014 with an octreotide scan 10/17/2014 confirming positive uptake in a mesenteric lymph node  Small bowel resection 11/15/2014 confirmed multiple small bowel neuroendocrine tumors, grade 2, with metastatic tumor involving 4 lymph nodes   2. History of intermittent muscle pain and nausea/vomiting secondary to bowel obstruction from #1  3. Microcytic anemia-secondary to iron deficiency, likely secondary to bleeding from the small bowel neuroendocrine tumors , now normal  4. Thoracic Aortic aneurysm  5.  Bicuspid aortic valve     Disposition:   she remains in clinical remission from the carcinoid tumor. The plan is to continue observation. We discussed the risk/ benefit of surveillance CT scans. She feels comfortable without CT scans are present. We will discuss this again  when she returns in September.  We will follow-up on the chromogranin A level from today. She will return for an office visit and chromogranin A in September 2017.  Betsy Coder, MD  03/29/2015  1:39 PM

## 2015-03-30 ENCOUNTER — Telehealth: Payer: Self-pay | Admitting: Oncology

## 2015-03-30 NOTE — Telephone Encounter (Signed)
Spoke with patient and he is aware of her 11/2015 appointments

## 2015-04-01 LAB — CHROMOGRANIN A: Chromogranin A: 2 nmol/L (ref 0–5)

## 2015-04-09 DIAGNOSIS — Z961 Presence of intraocular lens: Secondary | ICD-10-CM | POA: Diagnosis not present

## 2015-04-09 DIAGNOSIS — H26492 Other secondary cataract, left eye: Secondary | ICD-10-CM | POA: Diagnosis not present

## 2015-04-09 DIAGNOSIS — H524 Presbyopia: Secondary | ICD-10-CM | POA: Diagnosis not present

## 2015-04-10 ENCOUNTER — Other Ambulatory Visit: Payer: Self-pay | Admitting: Cardiology

## 2015-05-13 ENCOUNTER — Ambulatory Visit (INDEPENDENT_AMBULATORY_CARE_PROVIDER_SITE_OTHER): Payer: Medicare Other | Admitting: Cardiology

## 2015-05-13 ENCOUNTER — Encounter: Payer: Self-pay | Admitting: Cardiology

## 2015-05-13 ENCOUNTER — Telehealth: Payer: Self-pay | Admitting: Cardiology

## 2015-05-13 VITALS — BP 132/65 | HR 70 | Ht 62.0 in | Wt 116.8 lb

## 2015-05-13 DIAGNOSIS — I10 Essential (primary) hypertension: Secondary | ICD-10-CM

## 2015-05-13 DIAGNOSIS — I712 Thoracic aortic aneurysm, without rupture, unspecified: Secondary | ICD-10-CM

## 2015-05-13 DIAGNOSIS — R42 Dizziness and giddiness: Secondary | ICD-10-CM | POA: Diagnosis not present

## 2015-05-13 DIAGNOSIS — I359 Nonrheumatic aortic valve disorder, unspecified: Secondary | ICD-10-CM | POA: Diagnosis not present

## 2015-05-13 LAB — CBC WITH DIFFERENTIAL/PLATELET
BASOS ABS: 0 10*3/uL (ref 0.0–0.1)
BASOS PCT: 0 % (ref 0–1)
Eosinophils Absolute: 0.1 10*3/uL (ref 0.0–0.7)
Eosinophils Relative: 1 % (ref 0–5)
HEMATOCRIT: 38.3 % (ref 36.0–46.0)
HEMOGLOBIN: 12.5 g/dL (ref 12.0–15.0)
LYMPHS PCT: 28 % (ref 12–46)
Lymphs Abs: 2.1 10*3/uL (ref 0.7–4.0)
MCH: 27.2 pg (ref 26.0–34.0)
MCHC: 32.6 g/dL (ref 30.0–36.0)
MCV: 83.3 fL (ref 78.0–100.0)
MONO ABS: 0.7 10*3/uL (ref 0.1–1.0)
MPV: 10.5 fL (ref 8.6–12.4)
Monocytes Relative: 10 % (ref 3–12)
NEUTROS ABS: 4.5 10*3/uL (ref 1.7–7.7)
NEUTROS PCT: 61 % (ref 43–77)
Platelets: 221 10*3/uL (ref 150–400)
RBC: 4.6 MIL/uL (ref 3.87–5.11)
RDW: 14.8 % (ref 11.5–15.5)
WBC: 7.4 10*3/uL (ref 4.0–10.5)

## 2015-05-13 LAB — BASIC METABOLIC PANEL
BUN: 15 mg/dL (ref 7–25)
CHLORIDE: 106 mmol/L (ref 98–110)
CO2: 26 mmol/L (ref 20–31)
Calcium: 9.1 mg/dL (ref 8.6–10.4)
Creat: 0.6 mg/dL (ref 0.50–0.99)
Glucose, Bld: 82 mg/dL (ref 65–99)
POTASSIUM: 4.3 mmol/L (ref 3.5–5.3)
SODIUM: 143 mmol/L (ref 135–146)

## 2015-05-13 LAB — MAGNESIUM: MAGNESIUM: 1.8 mg/dL (ref 1.5–2.5)

## 2015-05-13 LAB — TSH: TSH: 1.51 m[IU]/L

## 2015-05-13 NOTE — Telephone Encounter (Signed)
Called patient about her symptoms. Patient stated she has been having lightheadedness for a couple of weeks. Patient stated that it happens every day at random times. Patient denies any chest pain or SOB. Patient states it is not positional. Patient stated she has not passed out and feels great other than these episodes of lightheadedness. Will consult [facility] Truitt Merle NP. Truitt Merle NP recommended that patient come in sooner. Cecilie Kicks NP, had an appointment opening this afternoon. Patient agreed to come in today instead of waiting for next week.

## 2015-05-13 NOTE — Telephone Encounter (Signed)
New Message  Pt c/o of lightheadedness for few days, has appt w/ Scott on 3/13. Please call back and discuss.

## 2015-05-13 NOTE — Patient Instructions (Addendum)
Medication Instructions:  Your physician recommends that you continue on your current medications as directed. Please refer to the Current Medication list given to you today.   Labwork: TODAY   BMET  CBC MAG  AND  TSH   Testing/Procedures: NONE  Follow-Up: Your physician wants you to follow-up [id number] DISCUSS WITH  DR  TURNER    RE THE  NEED TO RETURN  TO   OFFICE   You will receive a reminder letter in the mail two months in advance. If you don't receive a letter, please call our office to schedule the follow-up appointment.   Any Other Special Instructions Will Be Listed Below (If Applicable).     If you need a refill on your cardiac medications before your next appointment, please call your pharmacy.

## 2015-05-13 NOTE — Progress Notes (Signed)
Cardiology Office Note   Date:  05/13/2015   ID:  TAMBRE TIPPENS, DOB [birth year], MRN [id number]  PCP:  Reginia Naas, MD  Cardiologist:  Dr. Radford Pax    Chief Complaint  Patient presents with  . Dizziness      History of Present Illness: Debra [name] is a 65 y.o. female who presents for lightheadeness.  This occurs with rest or exertion on daily basis for last 1-2 weeks.  Just brief feeling of lightheadedness.  Usually starts when she rolls over in bed in the AM.  It has not impaired her activity.  No nausea no chest pain no SOB.  No awareness of rapid or irregular HR. She has no allergy symptoms.     She has a hx. Of fusiform ascending thoracic aneurysm followed by Dr. Prescott Gum which has been stable at 4.1 cm for several years. She remains asymptomatic and was seen 03/2015 with last CTA chest was stable.  She also has a known bicuspid aortic valve without stenosis.an echocardiogram was performed September 2016 showing normal LV function and a bicuspid aortic valve with mild aortic stenosis and insufficiency. 1. 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 The larger tumor showed focal involvement of the serosal surface, resection margins negative, lymphovascular invasion present 2. Persistent abdominal pain and nausea, CT and drug or free confirmed evidence of a distal small bowel obstruction 10/03/2014 with an octreotide scan 10/17/2014 confirming positive uptake in a mesenteric lymph node Small bowel resection 11/15/2014 confirmed multiple small bowel neuroendocrine tumors, grade 2, with metastatic tumor involving 4 lymph nodes.    Past Medical History  Diagnosis Date  . Coronary artery disease     nonobstructive  . H/O echocardiogram     bicuspid aortic valve with moderate aortic valve sclerosis,mild PR.TR.MR and mildly dilated aorta by echo on 8.2012  . Mild  mitral regurgitation   . Hypertension   . Heart murmur   . Aortic aneurysm ([facility])   . Thoracic aortic aneurysm ([facility])     mild, 4.1 cm by recent CT followed by Dr Darcey Nora  . Kidney stones     "passed them"  . Iron deficiency anemia     "can't tolerate the supplements right now" (11/15/2014)  . Arthritis     "moderate in back" (11/15/2014)  . Colon cancer ([facility])   . 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 Colonoscopy  10/15/11    diverticulosis present, mild severity, no polyps, repeat in 10 years.  . Abdominal hysterectomy    . Tonsillectomy    . Laparoscopic lysis of adhesions N/A 05/14/2014    Procedure: LAPAROSCOPIC LYSIS OF ADHESIONS x 38mins;  Surgeon: [name], MD;  Location: [facility];  Service: General;  Laterality: N/A;  . Laparoscopic small bowel resection N/A 05/14/2014    Procedure: LAPAROSCOPIC ASSISTED SMALL BOWEL RESECTION;  Surgeon: [name], MD;  Location: [facility];  Service: General;  Laterality: N/A;  . Colon surgery    . Small intestine surgery  11/15/2014  . Cataract extraction w/ intraocular lens implant    . Bowel resection N/A 11/15/2014    Procedure: SMALL BOWEL RESECTION;  Surgeon: [name], MD;  Location: [facility];  Service: General;  Laterality: N/A;     Current Outpatient Prescriptions  Medication Sig Dispense Refill  . aspirin  81 MG tablet Take 81 mg by mouth daily.    . metoprolol succinate (TOPROL-XL) 50 MG 24 hr tablet TAKE 1 TABLET (50 MG TOTAL) BY MOUTH DAILY. TAKE WITH OR IMMEDIATELY FOLLOWING A MEAL. 90 tablet 2  . simvastatin (ZOCOR) 20 MG tablet TAKE 1 TABLET BY MOUTH EVERY DAY 90 tablet 0   No current facility-administered medications for this visit.    Allergies:   Codeine; Opium; and Penicillins    Social History:  The patient  reports that she has never smoked. She has never used smokeless tobacco. She reports that she drinks about 2.4 oz of alcohol per week.  She reports that she does not use illicit drugs.   Family History:  The patient's family history includes Breast cancer in her mother; CAD in her maternal grandfather, paternal grandfather, and paternal grandmother; Cancer in her father; Diabetes in her father; Heart attack in her paternal grandmother. There is no history of Stroke or Hypertension.    ROS:  General:no colds or fevers, no weight changes Skin:no rashes or ulcers HEENT:no blurred vision, no congestion CV:see HPI PUL:see HPI GI:no diarrhea constipation or melena, no indigestion GU:no hematuria, no dysuria MS:no joint pain, no claudication Neuro:no syncope, + lightheadedness Endo:no diabetes, no thyroid disease  Wt Readings from Last 3 Encounters:  05/13/15 116 lb 12.8 oz (52.98 kg)  03/29/15 115 lb (52.164 kg)  03/20/15 110 lb (49.896 kg)     PHYSICAL EXAM: VS:  BP 132/65 mmHg  Pulse 70  Ht 5\' 2"  (1.575 m)  Wt 116 lb 12.8 oz (52.98 kg)  BMI 21.36 kg/m2 , BMI Body mass index is 21.36 kg/(m^2). General:Pleasant affect, NAD Skin:Warm and dry, brisk capillary refill HEENT:normocephalic, sclera clear, mucus membranes moist, TMs clear with good light reflex. Neck:supple, no JVD, no bruits  Heart:S1S2 RRR with 2/6  Systolic murmur,no gallup, rub or click Lungs:clear without rales, rhonchi, or wheezes [id number], non tender, + BS, do not palpate liver spleen or masses Ext:no lower ext edema, 2+ pedal pulses, 2+ radial pulses Neuro:alert and oriented, MAE, follows commands, + facial symmetry  BP lying 132/65 p 70, sit 141/75 p 78 Standing 135/77 p 85 Standing 3 min 135/81 p 81  EKG:  EKG is NOT ordered today.  Recent Labs: 06/19/2014: Magnesium 1.9 11/17/2014: BUN <5*; Creatinine, Ser 0.62; Potassium 4.3; Sodium 138 11/28/2014: ALT 13 03/29/2015: HGB 13.2; Platelets 226    Lipid Panel    Component Value Date/Time   CHOL 141 02/19/2014 0740   TRIG 92 02/19/2014 0740   HDL 69 02/19/2014 0740   LDLCALC 54  02/19/2014 0740       Other studies Reviewed: Additional studies/ records that were reviewed today include:   ECHO: 11/2014 .Study Conclusions  - Left ventricle: The cavity size was normal. Systolic function was normal. The estimated ejection fraction was in the range of 55% to 60%. Wall motion was normal; there were no regional wall motion abnormalities. There was an increased relative contribution of atrial contraction to ventricular filling, which may be due to hypovolemia. Doppler parameters are consistent with abnormal left ventricular relaxation (grade 1 diastolic dysfunction). - Aortic valve: Bicuspid; normal thickness leaflets. There was mild to moderate stenosis. There was trivial regurgitation. - Mitral valve: There was mild regurgitation. - Atrial septum: No defect or patent foramen ovale was identified.    ASSESSMENT AND PLAN: 1. lightheadedness     Unsure the cause of her lightheadedness will check TSH, CBC, BMP and Mg+, no orthostatic hypotension  2.ASCAD with no angina - continue ASA 3.HTN - well controlled - continue metoprolol 4.Dyslipidemia  - continue simvastatin 4. Thoracic aortic aneurysm followed by Dr. Prescott Gum 5. Bicuspid aortic valve with mild AS - recheck 2D echo in Sept  Will discuss with Dr. Radford Pax and then decide follow up.   Current medicines are reviewed with the patient today.  The patient Has no concerns regarding medicines.  The following changes have been made:  See above Labs/ tests ordered today include:see above  Disposition:   FU:  see above  Signed, Isaiah Serge, NP  05/13/2015 2:56 PM    [facility] [place], [place], [place], [place] Phone: [phone]; Fax: [phone]

## 2015-05-14 ENCOUNTER — Telehealth: Payer: Self-pay | Admitting: Cardiology

## 2015-05-14 ENCOUNTER — Telehealth: Payer: Self-pay

## 2015-05-14 DIAGNOSIS — R42 Dizziness and giddiness: Secondary | ICD-10-CM

## 2015-05-14 NOTE — Telephone Encounter (Signed)
-----   Message from Isaiah Serge, NP sent at 05/14/2015  7:56 AM EST ----- Labs normal

## 2015-05-14 NOTE — Telephone Encounter (Signed)
New message  ° ° ° °Pt is calling for lab results °

## 2015-05-14 NOTE — Telephone Encounter (Signed)
Per Cecilie Kicks, patient to have MRI brain for dizziness/lightheadedness to be ordered under Dr. Radford Pax.  Patient agrees with treatment plan and MRI ordered for precert/scheduling.

## 2015-05-14 NOTE — Telephone Encounter (Signed)
Informed patient of results and verbal understanding expressed.  

## 2015-05-17 ENCOUNTER — Ambulatory Visit (HOSPITAL_COMMUNITY)
Admission: RE | Admit: 2015-05-17 | Discharge: 2015-05-17 | Disposition: A | Discharge status: Home / Self Care | Payer: Medicare Other | Source: Ambulatory Visit | Attending: Cardiology | Admitting: Cardiology

## 2015-05-17 DIAGNOSIS — R42 Dizziness and giddiness: Secondary | ICD-10-CM | POA: Diagnosis not present

## 2015-05-20 ENCOUNTER — Ambulatory Visit: Payer: Medicare Other | Admitting: Physician Assistant

## 2015-06-04 ENCOUNTER — Telehealth: Payer: Self-pay | Admitting: Oncology

## 2015-06-04 NOTE — Telephone Encounter (Signed)
cld pt and lef tmessage of appt on 4/6@ 1:45

## 2015-06-04 NOTE — Telephone Encounter (Signed)
Scheduling received voicemail from patient requesting appointment.    Called patient who requests an "appointment with Dr. Benay Spice preferably on a Friday so spouse can attend.  Next week Thursday or Friday.  My neuroendocrine tumors have been considered in remission but the past week I've had several episodes.  A little pain a few days followed by a day with one watery diarrhea stool Friday.  Pain and diarrhea do not occur at the same time and there's a sense of urgency.  I had three watery diarrhea stools yesterday.  None so far today.  I had bowel resection March and September and this is not my normal.  I have eaten no new foods, new medicines, allergies or anything different.  My return number is [phone]."

## 2015-06-04 NOTE — Telephone Encounter (Signed)
Earlier note reviewed with Dr. Benay Spice: OK to schedule pt for next Thursday. Orders sent to schedulers.

## 2015-06-04 NOTE — Telephone Encounter (Signed)
S.w. roz in triage pt lvm that she needed soon appt for diarea

## 2015-06-13 ENCOUNTER — Ambulatory Visit (HOSPITAL_BASED_OUTPATIENT_CLINIC_OR_DEPARTMENT_OTHER): Payer: Medicare Other | Admitting: Nurse Practitioner

## 2015-06-13 VITALS — BP 161/82 | HR 77 | Temp 97.7°F | Resp 18 | Ht 62.0 in | Wt 117.3 lb

## 2015-06-13 DIAGNOSIS — D509 Iron deficiency anemia, unspecified: Secondary | ICD-10-CM | POA: Diagnosis not present

## 2015-06-13 DIAGNOSIS — C7A012 Malignant carcinoid tumor of the ileum: Secondary | ICD-10-CM | POA: Diagnosis not present

## 2015-06-13 DIAGNOSIS — C7A019 Malignant carcinoid tumor of the small intestine, unspecified portion: Secondary | ICD-10-CM

## 2015-06-13 NOTE — Progress Notes (Signed)
  [facility] OFFICE PROGRESS NOTE   Diagnosis:  Carcinoid tumor  INTERVAL HISTORY:   Ms. Terrian returns prior to scheduled follow-up. Since December she has had at least 2 episodes of loose stools per month . No more than 4 episodes in any one month. She has a single loose stool and then bowel habits return to normal. No abdominal pain. No flushing episodes. She has had a "small headache" for the past 2 months. She initially had associated lightheadedness which has resolved. She reports a negative brain scan. Last week she had an episode of "sharp shooting back pain". The pain resolved and has not recurred.  Objective:  Vital signs in last 24 hours:  Blood pressure 161/82, pulse 77, temperature 97.7 F (36.5 C), temperature source Oral, resp. rate 18, height 5\' 2"  (1.575 m), weight 117 lb 4.8 oz (53.207 kg), SpO2 99 %.    HEENT: PERRLA. Extraocular movements intact. No thrush or ulcers. Lymphatics: No palpable cervical, supraclavicular, axillary or inguinal lymph nodes. Resp: Lungs clear bilaterally. Cardio: Regular rate and rhythm. GI: Abdomen soft and nontender. No hepatomegaly. No mass. Vascular: No leg edema. Calves soft and nontender.    Lab Results:  Lab Results  Component Value Date   WBC 7.4 05/13/2015   HGB 12.5 05/13/2015   HCT 38.3 05/13/2015   MCV 83.3 05/13/2015   PLT 221 05/13/2015   NEUTROABS 4.5 05/13/2015    Imaging:  No results found.  Medications: I have reviewed the patient's current medications.  Assessment/Plan: 1. Carcinoid tumors involving the the ileum, status post a small bowel resection 05/14/2014 with 1.4 cm and 1.1 cm tumors noted,T4Nx  The larger tumor showed focal involvement of the serosal surface, resection margins negative, lymphovascular invasion present  Persistent abdominal pain and nausea, CT showed evidence of a distal small bowel obstruction 10/03/2014 with an octreotide scan 10/17/2014 confirming positive  uptake in a mesenteric lymph node  Small bowel resection 11/15/2014 confirmed multiple small bowel neuroendocrine tumors, grade 2, with metastatic tumor involving 4 lymph nodes  2. History of intermittent muscle pain and nausea/vomiting secondary to bowel obstruction from #1  3. Microcytic anemia-secondary to iron deficiency, likely secondary to bleeding from the small bowel neuroendocrine tumors , now normal  4. Thoracic Aortic aneurysm  5. Bicuspid aortic valve   Disposition: Ms. Whitefield appears well. She remains in clinical remission from the carcinoid tumor. Bowels habits are irregular sporadically. This may be related to the small bowel resections. I have a low clinical suspicion for carcinoid syndrome based on the current pattern. The plan is to continue to follow with observation. She will return for follow-up as scheduled in September. She will contact the office in the interim with any problems.   She will follow-up with Dr. Radford Pax for management of blood pressure.  Plan reviewed with Dr. Benay Spice. 25 minutes were spent face-to-face at today's visit with the majority of that time involved in counseling/coordination of care.    Ned Card ANP/GNP-BC   06/13/2015  2:24 PM

## 2015-06-14 ENCOUNTER — Telehealth: Payer: Self-pay | Admitting: Nurse Practitioner

## 2015-06-14 NOTE — Telephone Encounter (Signed)
per pof to sch referral-[facility] will call pt to sch appt

## 2015-06-17 ENCOUNTER — Telehealth: Payer: Self-pay | Admitting: Cardiology

## 2015-06-17 NOTE — Telephone Encounter (Signed)
Per Dr. Radford Pax informed patient her BP is good and no changes are recommended. Informed patient HA is most likely not caused by BP. Patient was grateful for call.

## 2015-06-17 NOTE — Telephone Encounter (Signed)
BP readings to Dr. Turner for review. 

## 2015-06-17 NOTE — Telephone Encounter (Signed)
New message  Pt c/o BP issue:  1. What are your last 5 BP readings?   06/16/15 @ 8p 137/78  06/16/15 @ 10p 117/69 06/17/15 @ 8a 139/79 06/17/15 @ 10a 140/67 06/17/15 @ 12p 121/77  2. Are you having any other symptoms (ex. Dizziness, headache, blurred vision, passed out)? Headaches for a couple of months but nothing else. Pt states that she feels great

## 2015-06-17 NOTE — Telephone Encounter (Signed)
Good BP no change

## 2015-08-13 ENCOUNTER — Other Ambulatory Visit: Payer: Self-pay | Admitting: Cardiology

## 2015-08-21 ENCOUNTER — Telehealth: Payer: Self-pay

## 2015-08-21 NOTE — Telephone Encounter (Signed)
Called patient about getting a PCP. The [facility] office seem to be closer for her. She will call them when she has time to set you an app.

## 2015-08-21 NOTE — Telephone Encounter (Signed)
Patient called this morning inquiring about referral to PCP that was made in April. Referral in system, called [facility] at [phone] and spoke with Brittney who states she will call the patient to inform her they do not have any availabilities until September but the [facility] office has openings next week. Spoke with patients daughter who states they did call just prior to me returning patients. Patient inquired if Dr. Benay Spice knew Dr. Martinique. Informed her he does not know this physican personally but is very comfortable referring to this office. Patient appreciative of information and will call back that office to make an appt.

## 2015-11-15 ENCOUNTER — Telehealth: Payer: Self-pay | Admitting: Oncology

## 2015-11-15 NOTE — Telephone Encounter (Signed)
Patient notified of 11/22/2015 appointment changes. ° °

## 2015-11-18 ENCOUNTER — Other Ambulatory Visit (HOSPITAL_BASED_OUTPATIENT_CLINIC_OR_DEPARTMENT_OTHER): Payer: Medicare Other

## 2015-11-18 DIAGNOSIS — C7A012 Malignant carcinoid tumor of the ileum: Secondary | ICD-10-CM | POA: Diagnosis present

## 2015-11-18 DIAGNOSIS — C7A019 Malignant carcinoid tumor of the small intestine, unspecified portion: Secondary | ICD-10-CM

## 2015-11-22 ENCOUNTER — Ambulatory Visit: Payer: Medicare Other | Admitting: Oncology

## 2015-11-22 LAB — CHROMOGRANIN A: CHROMOGRAN A: 1 nmol/L (ref 0–5)

## 2015-12-02 ENCOUNTER — Ambulatory Visit (HOSPITAL_BASED_OUTPATIENT_CLINIC_OR_DEPARTMENT_OTHER): Payer: Medicare Other | Admitting: Oncology

## 2015-12-02 ENCOUNTER — Telehealth: Payer: Self-pay | Admitting: Oncology

## 2015-12-02 VITALS — BP 138/69 | HR 83 | Temp 98.4°F | Resp 17 | Ht 62.0 in | Wt 121.9 lb

## 2015-12-02 DIAGNOSIS — Z23 Encounter for immunization: Secondary | ICD-10-CM | POA: Diagnosis not present

## 2015-12-02 DIAGNOSIS — C7A019 Malignant carcinoid tumor of the small intestine, unspecified portion: Secondary | ICD-10-CM

## 2015-12-02 MED ORDER — INFLUENZA VAC SPLIT QUAD 0.5 ML IM SUSY
0.5000 mL | PREFILLED_SYRINGE | Freq: Once | INTRAMUSCULAR | Status: AC
  Administered 2015-12-02: 0.5 mL via INTRAMUSCULAR
  Filled 2015-12-02: qty 0.5

## 2015-12-02 NOTE — Telephone Encounter (Signed)
07/23/2016 and 07/30/2016 Appointment scheduled per 09/25 LOS. Patient given AVS and schedule for future appointments.

## 2015-12-02 NOTE — Progress Notes (Signed)
  [facility] OFFICE PROGRESS NOTE   Diagnosis: Carcinoid tumors  INTERVAL HISTORY:   Ms. Nuber returns as scheduled. She has occasional diarrhea, once or twice per month. No consistent diarrhea. No flushing. Good appetite. She reports 1 episode of abdominal pain over the past several months.  Objective:  Vital signs in last 24 hours:  Blood pressure 138/69, pulse 83, temperature 98.4 F (36.9 C), temperature source Oral, resp. rate 17, height 5\' 2"  (1.575 m), weight 121 lb 14.4 oz (55.3 kg), SpO2 100 %.    HEENT: Neck without mass Lymphatics: No cervical, supraclavicular, axillary, or inguinal nodes Resp: Lungs clear bilaterally Cardio: Regular rate and rhythm, 2/6 systolic murmur GI: No hepatosplenomegaly, no mass, nontender Vascular: No leg edema   Lab Results: Chromogranin A on 11/18/2015- 1  Medications: I have reviewed the patient's current medications.  Assessment/Plan: 1. Carcinoid tumors involving the the ileum, status post a small bowel resection 05/14/2014 with 1.4 cm and 1.1 cm tumors noted,T4Nx ? The larger tumor showed focal involvement of the serosal surface, resection margins negative, lymphovascular invasion present ? Persistent abdominal pain and nausea, CT showed evidence of a distal small bowel obstruction 10/03/2014 with an octreotide scan 10/17/2014 confirming positive uptake in a mesenteric lymph node ? Small bowel resection 11/15/2014 confirmed multiple small bowel neuroendocrine tumors, grade 2, with metastatic tumor involving 4 lymph nodes  2. History of intermittent muscle pain and nausea/vomiting secondary to bowel obstruction from #1  3. Microcytic anemia-secondary to iron deficiency, likely secondary to bleeding from the small bowel neuroendocrine tumors , now normal  4. Thoracic Aortic aneurysm  5. Bicuspid aortic valve    Disposition:  Debra [name] is in clinical remission from carcinoid tumors. She will  contact us for new symptoms. She will return for an office visit and chromogranin A in 8 months. She received an influenza vaccine today.  Betsy Coder, MD  12/02/2015  4:17 PM

## 2016-01-07 ENCOUNTER — Other Ambulatory Visit: Payer: Self-pay | Admitting: Cardiology

## 2016-01-07 ENCOUNTER — Ambulatory Visit (INDEPENDENT_AMBULATORY_CARE_PROVIDER_SITE_OTHER): Payer: Medicare Other | Admitting: Family Medicine

## 2016-01-07 ENCOUNTER — Encounter: Payer: Self-pay | Admitting: Family Medicine

## 2016-01-07 VITALS — BP 124/70 | HR 79 | Resp 12 | Ht 62.0 in | Wt 120.2 lb

## 2016-01-07 DIAGNOSIS — E7800 Pure hypercholesterolemia, unspecified: Secondary | ICD-10-CM

## 2016-01-07 DIAGNOSIS — C7A019 Malignant carcinoid tumor of the small intestine, unspecified portion: Secondary | ICD-10-CM

## 2016-01-07 DIAGNOSIS — I712 Thoracic aortic aneurysm, without rupture: Secondary | ICD-10-CM | POA: Diagnosis not present

## 2016-01-07 DIAGNOSIS — I7121 Aneurysm of the ascending aorta, without rupture: Secondary | ICD-10-CM

## 2016-01-07 DIAGNOSIS — I10 Essential (primary) hypertension: Secondary | ICD-10-CM | POA: Diagnosis not present

## 2016-01-07 NOTE — Patient Instructions (Signed)
A few things to remember from today's visit:   Essential hypertension, benign  Ascending aortic aneurysm ([facility])  Malignant carcinoid tumor of small intestine ([facility])  Pure hypercholesterolemia   No changes today.  I have not received the records, so we will hold on vaccination for today and will repeat your back if you need any.   Medicare covers a annual preventive visit, which is strongly recommended , it is once per year and involves a series of questions to identify risk factors; so we can try to prevent possible complications. This does not need to be done by a doctor.  We have a nurse Investment banker, [employer]) here that is highly qualified to do it, it can be arrange same date you have a follow up appointment with me or labs scheduled, and it 100% covered by Medicare. So before you leave today I would like for you to arrange visit with Ms Wynetta Fines for Medicare wellness visit.   Please be sure medication list is accurate. If a new problem present, please set up appointment sooner than planned today.

## 2016-01-07 NOTE — Progress Notes (Signed)
HPI:   [name] is a 65 y.o. female, who is here today to establish care with me.  Former PCP: Dr Debra [name] Last preventive routine visit: 2 years ago.  Hx of HTN, neuroendocrine carcinoid tumor, HLD, and thoracic aortic aneurysm among some. She lives with her husband, daughter and grand daughter.    Independent ADL's and IADL's. Denies falls in the past year and denies depression symptoms.    HTN: Currently she is on Toprol-XL 50 mg daily. She follows with Dr. Radford [name] (cardiologist), and Dr. Kerby [name] (cardiovascular surgeon) annually.  Denies severe/frequent headache, visual changes, chest pain, dyspnea, palpitation, claudication, focal weakness, or edema.   Lab Results  Component Value Date   CREATININE 0.60 05/13/2015   BUN 15 05/13/2015   NA 143 05/13/2015   K 4.3 05/13/2015   CL 106 05/13/2015   CO2 26 05/13/2015    HLD: Currently she is on Zocor 20 mg daily. She is tolerating medication well, has not noted any side effect.   She is also on aspirin 81 mg daily.  Lab Results  Component Value Date   CHOL 141 02/19/2014   HDL 69 02/19/2014   LDLCALC 54 02/19/2014   TRIG 92 02/19/2014    She has a Hx of ascending thoracic aneurysm followed by Dr. Prescott [name] which has been stable at 4.1 cm for several years. She denies any symptom. Last CTA 03/20/15. Also Hx of bicuspid aortic valve without stenosis, follows with Dr Debra [name]. Echo: 9/ 2016 showing normal LV function and a bicuspid aortic valve with mild aortic stenosis and insufficiency. She denies orthopnea or PND.    -She also follows with oncologist, last visit 06/13/2015, for carcinoid tumor more involving small bowel; status post small bowel resection in March 2016 and September 2017. Currently she is not on pharmacologic treatment, chemotherapy, or radiation. She also follows with surgeon, Dr Debra [name]; and has followed with Dr.Megod, gastroenterologist.  According to [name], GI  symptoms started late 2014 when she began with intermittent episodes intense  of nausea and vomiting. Her symptoms were attributed initially to wine, allergies, nephrolithiasis, among others. 05/2014 she underwent exploratory laparoscopy, planned lysis of adhesions, what then was thought was the etiology for abdominal pain (s/p hysterectomy), found 3 carcinoid tumors of the small bowel.   After resection her symptoms of nausea and vomiting persisted unchanged.  Octreoscan revealed persistent masses and she underwent additional colectomy on 11/2014.   Since that surgery she has had significant improvement in her symptoms, occasionally she has "shadow" pain, lower abdomen, mild and does not last longer. She has not had nausea, vomiting, or hot flushes. States that she feels "great."  She is following a healthful diet and exercising regularly.  Loosed stools about 2-3 times per months, attributed more to post colectomy status that to carcinoid tumor, per pt report.      Concerns today: She used to to see dermatologist annually for "skin check" last office visit was about 2 years ago when her former PCP was concerned about the umbilical skin lesion, negative exam. She wonders if she needs to continue seeing the dermatologist annually, she denies any history of skin cancer or suspicious lesion. She is not sure about vaccinations needed at her age, she just got her influenza vaccine at the oncologist's office.     Review of Systems  Constitutional: Negative for activity change, appetite change, fatigue, fever and unexpected weight change.  HENT: Negative for mouth sores, nosebleeds and trouble  swallowing.   Eyes: Negative for pain and visual disturbance.  Respiratory: Negative for cough, shortness of breath and wheezing.   Cardiovascular: Negative for chest pain, palpitations and leg swelling.  Gastrointestinal: Positive for diarrhea. Negative for abdominal pain, blood in stool, nausea and  vomiting.       Negative for changes in bowel habits.  Genitourinary: Negative for decreased urine volume, difficulty urinating and hematuria.  Musculoskeletal: Negative for back pain and myalgias.  Skin: Negative for color change, rash and wound.  Neurological: Negative for syncope, weakness, numbness and headaches.  Psychiatric/Behavioral: Negative for confusion and sleep disturbance. The patient is not nervous/anxious.       Current Outpatient Prescriptions on File Prior to Visit  Medication Sig Dispense Refill  . aspirin 81 MG tablet Take 81 mg by mouth daily.    . metoprolol succinate (TOPROL-XL) 50 MG 24 hr tablet TAKE 1 TABLET (50 MG TOTAL) BY MOUTH DAILY. TAKE WITH OR IMMEDIATELY FOLLOWING A MEAL. 90 tablet 2  . simvastatin (ZOCOR) 20 MG tablet TAKE 1 TABLET BY MOUTH EVERY DAY 90 tablet 3   No current facility-administered medications on file prior to visit.      Past Medical History:  Diagnosis Date  . Aortic aneurysm ([place])   . Arthritis    "moderate in back" (11/15/2014)  . 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 Colon cancer ([place])   . Coronary artery disease    nonobstructive  . H/O echocardiogram    bicuspid aortic valve with moderate aortic valve sclerosis,mild PR.TR.MR and mildly dilated aorta by echo on 8.2012  . Heart murmur   . Hypertension   . Iron deficiency anemia    "can't tolerate the supplements right now" (11/15/2014)  . Kidney stones    "passed them"  . Mild mitral regurgitation   . Thoracic aortic aneurysm (HCC)    mild, 4.1 cm by recent CT followed by Dr Darcey Nora   Allergies  Allergen Reactions  . Codeine Anaphylaxis  . Opium Anaphylaxis  . Penicillins Anaphylaxis    Family History  Problem Relation Age of Onset  . CAD Maternal Grandfather   . CAD Paternal Grandmother   . Heart attack Paternal Grandmother   . CAD Paternal Grandfather   . Breast cancer Mother   . Cancer Father   . Diabetes Father   . Stroke Neg  Hx   . Hypertension Neg Hx     Social History   Social History  . Marital status: Married    Spouse name: N/A  . Number of children: N/A  . Years of education: N/A   Social History Main Topics  . Smoking status: Never Smoker  . Smokeless tobacco: Never Used  . Alcohol use 2.4 oz/week    4 Glasses of wine per week  . Drug use: No  . Sexual activity: Yes   Other Topics Concern  . None   Social History Narrative   Married, spouse Bailey Spells    Vitals:   01/07/16 0748  BP: 124/70  Pulse: 79  Resp: 12   O2 sat 98% at RA.  Body mass index is 21.99 kg/m.    Physical Exam  Nursing note and vitals reviewed. Constitutional: She is oriented to person, place, and time. She appears well-developed and well-nourished. No distress.  HENT:  Head: Atraumatic.  Mouth/Throat: Oropharynx is clear and moist and mucous membranes are normal.  Eyes: Conjunctivae and EOM are normal. Pupils are equal, round, and reactive to  light.  Neck: No JVD present. No tracheal deviation present. No thyroid mass and no thyromegaly present.  Cardiovascular: Normal rate and regular rhythm.   Murmur (SEM I/VI RUSB and LUSB) heard. Pulses:      Dorsalis pedis pulses are 2+ on the right side, and 2+ on the left side.  Respiratory: Effort normal and breath sounds normal. No respiratory distress.  GI: Soft. She exhibits no mass. There is no hepatomegaly. There is no tenderness.  Musculoskeletal: She exhibits no edema or tenderness.  Lymphadenopathy:    She has no cervical adenopathy.  Neurological: She is alert and oriented to person, place, and time. She has normal strength. Coordination normal.  Skin: Skin is warm. No rash noted. No erythema.  Psychiatric: She has a normal mood and affect.  Well groomed, good eye contact.      ASSESSMENT AND PLAN:     Debra [name] was seen today for establish care.  Diagnoses and all orders for this visit:  Essential hypertension, benign  Adequately  controlled. No changes in current management. DASH-low salt diet recommended. Periodic eye exam. F/U in 3-4 months, before if needed.  Ascending aortic aneurysm (HCC)  Asymptomatic. Continue Metoprolol succinate. She will continue following with Dr.Trigt annually. Instructed about warning signs.   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 Grandville [name] (surgeron)and Dr. Sarita Bottom (oncologists) as recommended. Currently she doesn't seem to be having any symptom associated with this type of neoplasia.   Pure hypercholesterolemia  No changes in current management. She is reporting labs done recently, I don't see any recent lipid panel. It seems like future lab placed 11/2014. Follow-up in 3-4 months.       -In regard to vaccination it is not clear if she had Prevnar 13 about 2 years ago when she first had her Medicare preventive visit. We will try to obtain records from former PCP, appointment with Ms Manuela Schwartz for Medicare preventive care, and will update vaccinations if needed. -She doesn't have any suspicious skin lesion that warrant dermatology consultation at this time, no history of skin cancer. So for now she can hold on any blood dermatology visits if she wishes to do so. Sunscreen and clothing protection to avoid direct sunlight recommended.       Lurdes Haltiwanger G. Martinique, MD  [facility]. [place] office.

## 2016-01-07 NOTE — Progress Notes (Signed)
Pre visit review using our clinic review tool, if applicable. No additional management support is needed unless otherwise documented below in the visit note. 

## 2016-03-11 ENCOUNTER — Other Ambulatory Visit: Payer: Self-pay | Admitting: Family Medicine

## 2016-03-11 DIAGNOSIS — Z1231 Encounter for screening mammogram for malignant neoplasm of breast: Secondary | ICD-10-CM

## 2016-03-30 ENCOUNTER — Telehealth: Payer: Self-pay | Admitting: *Deleted

## 2016-03-30 NOTE — Telephone Encounter (Signed)
Discussed pt's call with Dr. Benay Spice: Unlikely this is related to carcinoid. See PCP if headaches persist. Have PCP call if there is any question.  Pt voiced understanding, asked what she should look for if this worsens. Told her to call/ go to ED if she develops any neurologic symptoms like weakness on one side or vision loss.

## 2016-03-30 NOTE — Telephone Encounter (Addendum)
"  I have neuroendocrine cancer and need Dr. Benay Spice to know for the past few mornings I wake up with a headache or a headache wakes me up.  I've never had headaches before.  I take tylenol or ibuprofin with no relief.  There's a dull pain on both sides of my head for several weeks that's gotten worse.  I rate this pain as a three to five on the pain scale.  Lasts most of the day and wanes.  The evening I realize I do not hurt.  I never drink 64 oz water.  Nothing has changed with my appetite either.  No n/v, visual changes, dizziness or any other symptoms.    Return number is home [phone]."

## 2016-04-07 ENCOUNTER — Encounter: Payer: Self-pay | Admitting: Family Medicine

## 2016-04-07 ENCOUNTER — Ambulatory Visit (INDEPENDENT_AMBULATORY_CARE_PROVIDER_SITE_OTHER): Payer: Medicare Other | Admitting: Family Medicine

## 2016-04-07 VITALS — BP 100/70 | HR 73 | Resp 12 | Ht 62.0 in | Wt 123.0 lb

## 2016-04-07 DIAGNOSIS — G4452 New daily persistent headache (NDPH): Secondary | ICD-10-CM | POA: Diagnosis not present

## 2016-04-07 DIAGNOSIS — E7800 Pure hypercholesterolemia, unspecified: Secondary | ICD-10-CM | POA: Diagnosis not present

## 2016-04-07 DIAGNOSIS — I10 Essential (primary) hypertension: Secondary | ICD-10-CM | POA: Diagnosis not present

## 2016-04-07 DIAGNOSIS — C7A019 Malignant carcinoid tumor of the small intestine, unspecified portion: Secondary | ICD-10-CM | POA: Diagnosis not present

## 2016-04-07 DIAGNOSIS — Z23 Encounter for immunization: Secondary | ICD-10-CM | POA: Diagnosis not present

## 2016-04-07 LAB — LIPID PANEL
CHOLESTEROL: 173 mg/dL (ref 0–200)
HDL: 73.5 mg/dL (ref >39.00)
LDL CALC: 80 mg/dL (ref 0–99)
NonHDL: 99.38
TRIGLYCERIDES: 99 mg/dL (ref 0.0–149.0)
Total CHOL/HDL Ratio: 2
VLDL: 19.8 mg/dL (ref 0.0–40.0)

## 2016-04-07 LAB — COMPREHENSIVE METABOLIC PANEL
ALBUMIN: 4.2 g/dL (ref 3.5–5.2)
ALT: 10 U/L (ref 0–35)
AST: 16 U/L (ref 0–37)
Alkaline Phosphatase: 94 U/L (ref 39–117)
BUN: 18 mg/dL (ref 6–23)
CHLORIDE: 106 meq/L (ref 96–112)
CO2: 31 mEq/L (ref 19–32)
CREATININE: 0.59 mg/dL (ref 0.40–1.20)
Calcium: 9.3 mg/dL (ref 8.4–10.5)
GFR: 107.98 mL/min (ref >60.00)
Glucose, Bld: 92 mg/dL (ref 70–99)
Potassium: 4.6 mEq/L (ref 3.5–5.1)
SODIUM: 142 meq/L (ref 135–145)
TOTAL PROTEIN: 6.1 g/dL (ref 6.0–8.3)
Total Bilirubin: 1.3 mg/dL — ABNORMAL HIGH (ref 0.2–1.2)

## 2016-04-07 NOTE — Progress Notes (Signed)
Debra [name] is a 66 y.o.female, who is here today to follow on HTN.  Currently she is on Metoprolol Succinate 50 mg daily. She is taking medications as instructed, no side effects reported.  She has not noted visual changes, exertional chest pain, dyspnea,  focal weakness, or edema.    Lab Results  Component Value Date   CREATININE 0.60 05/13/2015   BUN 15 05/13/2015   NA 143 05/13/2015   K 4.3 05/13/2015   CL 106 05/13/2015   CO2 26 05/13/2015    She is also concerned about new onset of headaches. According to patient, she "never" gets headaches and about 4-5 weeks ago she started suddenly waking up with parietal bilateral headache, pressure sensation, "not unbearable" but "painful." She doesn't like to take medication for pain, but about 3 times she took Tylenol 2 tablets, which didn't relieve her headache completely. Headache  lasted until mid afternoon. She denies associated visual changes, numbness, tingling, or focal weakness. No nausea or vomiting. For the past 2 days headache seems to be improving and today she did not have it.  She denies any history of trauma or new stressors. She has history of neuroendocrine carcinoid tumor more, she follows with oncologists.  She had a head MRI in 05/17/2015 because episodes of lightheadedness: Normal for age noncontrast MRI appearance of the brain.   Hyperlipidemia:  Currently on Zocor 20 mg daily Following a low fat diet: Yes..  She has not noted side effects with medication.  Hx of ascending aorta aneurysm and CAD , she follows with Dr Radford Pax.    Review of Systems  Constitutional: Negative for appetite change, fatigue, fever and unexpected weight change.  HENT: Negative for mouth sores, nosebleeds and trouble swallowing.   Eyes: Negative for photophobia, pain, redness and visual disturbance.  Respiratory: Negative for cough, shortness of breath and wheezing.   Cardiovascular: Negative for chest  pain, palpitations and leg swelling.  Gastrointestinal: Negative for abdominal pain, nausea and vomiting.       Negative for changes in bowel habits.  Genitourinary: Negative for decreased urine volume and hematuria.  Musculoskeletal: Negative for gait problem and neck pain.  Skin: Negative for rash.  Neurological: Positive for headaches. Negative for seizures, syncope, weakness and numbness.  Psychiatric/Behavioral: Negative for confusion and sleep disturbance. The patient is not nervous/anxious.      Current Outpatient Prescriptions on File Prior to Visit  Medication Sig Dispense Refill  . aspirin 81 MG tablet Take 81 mg by mouth daily.    . metoprolol succinate (TOPROL-XL) 50 MG 24 hr tablet TAKE 1 TABLET (50 MG TOTAL) BY MOUTH DAILY. TAKE WITH OR IMMEDIATELY FOLLOWING A MEAL. 90 tablet 0  . simvastatin (ZOCOR) 20 MG tablet TAKE 1 TABLET BY MOUTH EVERY DAY 90 tablet 3   No current facility-administered medications on file prior to visit.      Past Medical History:  Diagnosis Date  . Aortic aneurysm ([place])   . Arthritis    "moderate in back" (11/15/2014)  . 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 Colon cancer ([place])   . Coronary artery disease    nonobstructive  . H/O echocardiogram    bicuspid aortic valve with moderate aortic valve sclerosis,mild PR.TR.MR and mildly dilated aorta by echo on 8.2012  . Heart murmur   . Hypertension   . Iron deficiency anemia    "can't tolerate the supplements right now" (11/15/2014)  . Kidney stones    "  passed them"  . Mild mitral regurgitation   . Thoracic aortic aneurysm (HCC)    mild, 4.1 cm by recent CT followed by Dr Darcey Nora    Allergies  Allergen Reactions  . Codeine Anaphylaxis  . Opium Anaphylaxis  . Penicillins Anaphylaxis    Social History   Social History  . Marital status: Married    Spouse name: N/A  . Number of children: N/A  . Years of education: N/A   Social History Main Topics  .  Smoking status: Never Smoker  . Smokeless tobacco: Never Used  . Alcohol use 2.4 oz/week    4 Glasses of wine per week  . Drug use: No  . Sexual activity: Yes   Other Topics Concern  . None   Social History Narrative   Married, spouse Kenyell Sirak    Vitals:   04/07/16 0824  BP: 100/70  Pulse: 73  Resp: 12  O2 sat 97% at RA.  Body mass index is 22.5 kg/m.    Physical Exam  Nursing note and vitals reviewed. Constitutional: She is oriented to person, place, and time. She appears well-developed and well-nourished. No distress.  HENT:  Head: Atraumatic.  Mouth/Throat: Oropharynx is clear and moist and mucous membranes are normal.  Eyes: Conjunctivae and EOM are normal. Pupils are equal, round, and reactive to light.  Neck: Normal range of motion.  Cardiovascular: Normal rate and regular rhythm.   Murmur (SEM I/VI RUSB and LUSB) heard. Pulses:      Dorsalis pedis pulses are 2+ on the right side, and 2+ on the left side.  Respiratory: Effort normal and breath sounds normal. No respiratory distress.  GI: Soft. She exhibits no mass. There is no hepatomegaly. There is no tenderness.  Musculoskeletal: She exhibits no edema or tenderness.       Cervical back: She exhibits no tenderness and no bony tenderness.  Lymphadenopathy:    She has no cervical adenopathy.  Neurological: She is alert and oriented to person, place, and time. She has normal strength. No cranial nerve deficit. Coordination and gait normal.  Skin: Skin is warm. No rash noted. No erythema.  Psychiatric: She has a normal mood and affect.  Well groomed, good eye contact.    ASSESSMENT AND PLAN:   Xuri was seen today for follow-up.  Diagnoses and all orders for this visit:  Lab Results  Component Value Date   CREATININE 0.59 04/07/2016   BUN 18 04/07/2016   NA 142 04/07/2016   K 4.6 04/07/2016   CL 106 04/07/2016   CO2 31 04/07/2016   Lab Results  Component Value Date   CHOL 173 04/07/2016    HDL 73.50 04/07/2016   LDLCALC 80 04/07/2016   TRIG 99.0 04/07/2016   CHOLHDL 2 04/07/2016    Headache, new daily persistent ([facility])  Given her Hx of malignant carcinoid and no Hx of headaches, I think brain imaging is warrant. She prefers CT instead MRI. Clearly instructed about warning signs.  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  Essential hypertension, benign On lower normal range but given her Hx of ascending aortic aneurysm this BP is adequate. No changes in current management. DASH-low salt diet recommended. Eye exam recommended annually. F/U in 6 months, before if needed.  -     Comprehensive metabolic panel  Pure hypercholesterolemia No changes in current management, will follow labs done today and  will give further recommendations accordingly. F/U in a year.  -     Lipid panel  Need for vaccination with 13-polyvalent pneumococcal conjugate vaccine -     Pneumococcal conjugate vaccine 13-valent      -Ms. Elleny Zuver advised to return sooner than planned today if new concerns arise.     Rasaan Brotherton G. Martinique, MD  [facility]. [place] office.

## 2016-04-07 NOTE — Patient Instructions (Signed)
A few things to remember from today's visit:   Malignant carcinoid tumor of small intestine (HCC)  Headache, new daily persistent ([facility]) - Plan: CT HEAD W & WO CONTRAST  Essential hypertension, benign - Plan: Comprehensive metabolic panel  Ascending aortic aneurysm ([place])  Pure hypercholesterolemia - Plan: Lipid panel  No changes today. Monitor blood pressure at home.   We have ordered labs or studies at this visit.  It can take up to 1-2 weeks for results and processing. IF results require follow up or explanation, we will call you with instructions. Clinically stable results will be released to your [facility]. If you have not heard from [place] or cannot find your results in [facility] in 2 weeks please contact our office at [phone].  If you are not yet signed up for [facility], please consider signing up   Please be sure medication list is accurate. If a new problem present, please set up appointment sooner than planned today.

## 2016-04-07 NOTE — Progress Notes (Signed)
Pre visit review using our clinic review tool, if applicable. No additional management support is needed unless otherwise documented below in the visit note. 

## 2016-04-11 ENCOUNTER — Other Ambulatory Visit: Payer: Self-pay | Admitting: Cardiology

## 2016-04-13 ENCOUNTER — Ambulatory Visit
Admission: RE | Admit: 2016-04-13 | Discharge: 2016-04-13 | Disposition: A | Discharge status: Home / Self Care | Payer: Medicare Other | Source: Ambulatory Visit | Attending: Family Medicine | Admitting: Family Medicine

## 2016-04-13 DIAGNOSIS — Z1231 Encounter for screening mammogram for malignant neoplasm of breast: Secondary | ICD-10-CM

## 2016-04-14 ENCOUNTER — Encounter: Payer: Self-pay | Admitting: Family Medicine

## 2016-04-14 ENCOUNTER — Other Ambulatory Visit: Payer: Self-pay | Admitting: Family Medicine

## 2016-04-14 DIAGNOSIS — R928 Other abnormal and inconclusive findings on diagnostic imaging of breast: Secondary | ICD-10-CM

## 2016-04-20 ENCOUNTER — Ambulatory Visit
Admission: RE | Admit: 2016-04-20 | Discharge: 2016-04-20 | Disposition: A | Discharge status: Home / Self Care | Payer: Medicare Other | Source: Ambulatory Visit | Attending: Family Medicine | Admitting: Family Medicine

## 2016-04-20 DIAGNOSIS — R928 Other abnormal and inconclusive findings on diagnostic imaging of breast: Secondary | ICD-10-CM

## 2016-04-21 ENCOUNTER — Encounter: Payer: Self-pay | Admitting: Family Medicine

## 2016-04-21 ENCOUNTER — Ambulatory Visit
Admission: RE | Admit: 2016-04-21 | Discharge: 2016-04-21 | Disposition: A | Discharge status: Home / Self Care | Payer: Medicare Other | Source: Ambulatory Visit | Attending: Family Medicine | Admitting: Family Medicine

## 2016-04-21 DIAGNOSIS — G4452 New daily persistent headache (NDPH): Secondary | ICD-10-CM

## 2016-04-21 MED ORDER — IOPAMIDOL (ISOVUE-300) INJECTION 61%
75.0000 mL | Freq: Once | INTRAVENOUS | Status: AC | PRN
  Administered 2016-04-21: 75 mL via INTRAVENOUS

## 2016-07-23 ENCOUNTER — Other Ambulatory Visit (HOSPITAL_BASED_OUTPATIENT_CLINIC_OR_DEPARTMENT_OTHER): Payer: Medicare Other

## 2016-07-23 ENCOUNTER — Other Ambulatory Visit: Payer: Self-pay | Admitting: *Deleted

## 2016-07-23 DIAGNOSIS — C7A019 Malignant carcinoid tumor of the small intestine, unspecified portion: Secondary | ICD-10-CM

## 2016-07-23 DIAGNOSIS — Z23 Encounter for immunization: Secondary | ICD-10-CM

## 2016-07-23 DIAGNOSIS — I712 Thoracic aortic aneurysm, without rupture: Secondary | ICD-10-CM

## 2016-07-23 DIAGNOSIS — I7121 Aneurysm of the ascending aorta, without rupture: Secondary | ICD-10-CM

## 2016-07-27 LAB — CHROMOGRANIN A: Chromogranin A: 1 nmol/L (ref 0–5)

## 2016-07-30 ENCOUNTER — Ambulatory Visit (HOSPITAL_BASED_OUTPATIENT_CLINIC_OR_DEPARTMENT_OTHER): Payer: Medicare Other | Admitting: Oncology

## 2016-07-30 ENCOUNTER — Telehealth: Payer: Self-pay | Admitting: Oncology

## 2016-07-30 VITALS — BP 154/76 | HR 67 | Temp 97.4°F | Resp 20 | Ht 62.0 in | Wt 125.0 lb

## 2016-07-30 DIAGNOSIS — C7A019 Malignant carcinoid tumor of the small intestine, unspecified portion: Secondary | ICD-10-CM

## 2016-07-30 NOTE — Telephone Encounter (Signed)
Appointments scheduled per 07/30/16 los. Patient was given a copy of the AVS report and appointment schedule per 07/30/16 los. °

## 2016-07-30 NOTE — Progress Notes (Signed)
  [place] [facility] OFFICE PROGRESS NOTE   Diagnosis: Carcinoid tumor  INTERVAL HISTORY:   Debra [name] returns as scheduled. She feels well. Good appetite. She had 5 days of diarrhea in October 2017 and or to another 3-4 days of diarrhea over the past 7 months. No fever. She reports a few episodes of feeling lightheaded. No other complaint.  Objective:  Vital signs in last 24 hours:  Blood pressure (!) 154/76, pulse 67, temperature 97.4 F (36.3 C), temperature source Oral, resp. rate 20, height 5\' 2"  (1.575 m), weight 125 lb (56.7 kg), SpO2 100 %.    HEENT: Neck without mass Lymphatics: No cervical, supra-clavicular, axillary, or inguinal nodes Resp: Lungs clear bilaterally Cardio: Regular rate and rhythm, 2/6 systolic murmur GI: No hepatosplenomegaly, no mass, nontender Vascular: No leg edema   Lab : Chromogranin A on 07/23/2016-1   Medications: I have reviewed the patient's current medications.  Assessment/Plan: 1. Carcinoid tumors involving the the ileum, status post a small bowel resection 05/14/2014 with 1.4 cm and 1.1 cm tumors noted,T4Nx ? The larger tumor showed focal involvement of the serosal surface, resection margins negative, lymphovascular invasion present ? Persistent abdominal pain and nausea, CT showed evidence of a distal small bowel obstruction 10/03/2014 with an octreotide scan 10/17/2014 confirming positive uptake in a mesenteric lymph node ? Small bowel resection 11/15/2014 confirmed multiple small bowel neuroendocrine tumors, grade 2, with metastatic tumor involving 4 lymph nodes  2. History of intermittent muscle pain and nausea/vomiting secondary to bowel obstruction from #1  3. History of Microcytic anemia-secondary to iron deficiency, likely secondary to bleeding from the small bowel neuroendocrine tumors   4. Thoracic Aortic aneurysm  5. Bicuspid aortic valve    Disposition:  Debra [name] remains in clinical remission  from the carcinoid tumor. She will return for an office visit and chromogranin A level in 8 months. She will contact us in the interim for new symptoms.  15 minutes were spent with the patient today. The majority of the time was used for counseling and coordination of care.  Betsy Coder, MD  07/30/2016  12:05 PM

## 2016-08-11 ENCOUNTER — Other Ambulatory Visit: Payer: Self-pay | Admitting: Cardiology

## 2016-08-19 ENCOUNTER — Encounter: Payer: Medicare Other | Admitting: Cardiothoracic Surgery

## 2016-08-19 ENCOUNTER — Ambulatory Visit
Admission: RE | Admit: 2016-08-19 | Discharge: 2016-08-19 | Disposition: A | Discharge status: Home / Self Care | Payer: Medicare Other | Source: Ambulatory Visit | Attending: Cardiothoracic Surgery | Admitting: Cardiothoracic Surgery

## 2016-08-19 DIAGNOSIS — I7121 Aneurysm of the ascending aorta, without rupture: Secondary | ICD-10-CM

## 2016-08-19 DIAGNOSIS — I712 Thoracic aortic aneurysm, without rupture: Secondary | ICD-10-CM

## 2016-08-19 MED ORDER — GADOBENATE DIMEGLUMINE 529 MG/ML IV SOLN
10.0000 mL | Freq: Once | INTRAVENOUS | Status: AC | PRN
  Administered 2016-08-19: 10 mL via INTRAVENOUS

## 2016-08-23 ENCOUNTER — Other Ambulatory Visit: Payer: Self-pay | Admitting: Cardiology

## 2016-09-01 ENCOUNTER — Other Ambulatory Visit: Payer: Self-pay | Admitting: Cardiology

## 2016-09-06 ENCOUNTER — Other Ambulatory Visit: Payer: Self-pay | Admitting: Cardiology

## 2016-09-16 ENCOUNTER — Encounter: Payer: Medicare Other | Admitting: Cardiothoracic Surgery

## 2016-09-22 ENCOUNTER — Other Ambulatory Visit: Payer: Self-pay | Admitting: Cardiology

## 2016-09-22 MED ORDER — METOPROLOL SUCCINATE ER 50 MG PO TB24: ORAL_TABLET | ORAL | 0 refills | Status: DC

## 2016-09-22 NOTE — Telephone Encounter (Signed)
Pt's Rx was sent to pt's pharmacy as requested. Confirmation received.  °

## 2016-09-30 ENCOUNTER — Ambulatory Visit (INDEPENDENT_AMBULATORY_CARE_PROVIDER_SITE_OTHER): Payer: Medicare Other | Admitting: Cardiothoracic Surgery

## 2016-09-30 ENCOUNTER — Encounter: Payer: Self-pay | Admitting: Cardiothoracic Surgery

## 2016-09-30 VITALS — BP 136/74 | HR 78 | Resp 20 | Ht 62.0 in | Wt 123.0 lb

## 2016-09-30 DIAGNOSIS — I712 Thoracic aortic aneurysm, without rupture, unspecified: Secondary | ICD-10-CM

## 2016-09-30 NOTE — Progress Notes (Signed)
PCP is Martinique, Betty G, MD Referring Provider is Carol Ada, MD  Chief Complaint  Patient presents with  . 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 The patient has hypertension well controlled with Toprol 50 mg daily-she also takes aspirin and Zocor.  She denies chest pain. Her cardiac activities include walking and yoga.  MRA shows a stable 4.1 cm fusiform aneurysm. No evidence of mural thickening or hematoma. Descending thoracic aorta is normal. She has appointment see her cardiologist, Dr. Radford Pax for her bicuspid aortic valve later this year.  Past Medical History:  Diagnosis Date  . Aortic aneurysm ([place])   . Arthritis    "moderate in back" (11/15/2014)  . 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 Colon cancer ([place])   . Coronary artery disease    nonobstructive  . H/O echocardiogram    bicuspid aortic valve with moderate aortic valve sclerosis,mild PR.TR.MR and mildly dilated aorta by echo on 8.2012  . Heart murmur   . Hypertension   . Iron deficiency anemia    "can't tolerate the supplements right now" (11/15/2014)  . Kidney stones    "passed them"  . Mild mitral regurgitation   . Thoracic aortic aneurysm (HCC)    mild, 4.1 cm by recent CT followed by Dr Darcey Nora    Past Surgical History:  Procedure Laterality Date  . ABDOMINAL HYSTERECTOMY    . BOWEL RESECTION N/A 11/15/2014   Procedure: SMALL BOWEL RESECTION;  Surgeon: [name], MD;  Location: [facility];  Service: General;  Laterality: N/A;  . CATARACT EXTRACTION W/ INTRAOCULAR LENS IMPLANT    . COLON SURGERY    . COLONOSCOPY  10/15/11   diverticulosis present, mild severity, no polyps, repeat in 10 years.  Marland Kitchen LAPAROSCOPIC LYSIS OF ADHESIONS  N/A 05/14/2014   Procedure: LAPAROSCOPIC LYSIS OF ADHESIONS x 50mins;  Surgeon: [name], MD;  Location: [facility];  Service: General;  Laterality: N/A;  . LAPAROSCOPIC SMALL BOWEL RESECTION N/A 05/14/2014   Procedure: LAPAROSCOPIC ASSISTED SMALL BOWEL RESECTION;  Surgeon: [name], MD;  Location: [facility];  Service: General;  Laterality: N/A;  . SMALL INTESTINE SURGERY  11/15/2014  . TONSILLECTOMY      Family History  Problem Relation Age of Onset  . CAD Maternal Grandfather   . CAD Paternal Grandmother   . Heart attack Paternal Grandmother   . CAD Paternal Grandfather   . Breast cancer Mother   . Cancer Father   . Diabetes Father   . Stroke Neg Hx   . Hypertension Neg Hx     Social History Social History  Substance Use Topics  . Smoking status: Never Smoker  . Smokeless tobacco: Never Used  . Alcohol use 2.4 oz/week    4 Glasses of wine per week    Current Outpatient Prescriptions  Medication Sig Dispense Refill  . aspirin 81 MG tablet Take 81 mg by mouth daily.    . metoprolol succinate (TOPROL-XL) 50 MG 24 hr tablet TAKE 1 TABLET (50 MG TOTAL) BY MOUTH DAILY. TAKE WITH OR IMMEDIATELY FOLLOWING A MEAL. 90 tablet 0  . simvastatin (ZOCOR) 20 MG tablet TAKE 1 TABLET BY MOUTH EVERY DAY 60 tablet 0   No current facility-administered  medications for this visit.     Allergies  Allergen Reactions  . Codeine Anaphylaxis  . Opium Anaphylaxis  . Penicillins Anaphylaxis    Review of Systems  Mild dizziness and headache-recent brain MRI negative History of carcinoid intestinal tumor followed by oncology without evidence of recurrence. No change in bowel habits No shortness of breath or chest pain No presyncope No active dental complaints No palpitation No difficulty swallowing No peripheral edema  BP 136/74   Pulse 78   Resp 20   Ht 5\' 2"  (1.575 m)   Wt 123 lb (55.8 kg)   SpO2 97% Comment: RA  BMI 22.50 kg/m  Physical Exam      Exam    General- alert and  comfortable   Lungs- clear without rales, wheezes   Neck-no JVD, 2+ carotid pulses, no adenopathy   Cor- regular rate and rhythm, 1+ AS murmur , no gallop   Abdomen- soft, non-tender   Extremities - warm, non-tender, minimal edema   Neuro- oriented, appropriate, no focal weakness   Diagnostic Tests: MRI images reviewed with patient Stable 4.1 cm fusiform ascending aneurysm  Impression: Surgery would not be indicated until the ascending  aorta diameter reaches 5.0 cm in a patient with bicuspid aortic valve.  Recommend continuing blood pressure control, current medications and serial scanning. Since there is been no change in several years we will schedule her next scan for 2 years with MRA to avoid radiation  Plan: Return in 2 years with MRA of thoracic aorta  Len Childs, MD [facility] [phone]

## 2016-11-02 ENCOUNTER — Other Ambulatory Visit: Payer: Self-pay | Admitting: Cardiology

## 2016-11-20 ENCOUNTER — Encounter: Payer: Self-pay | Admitting: Cardiology

## 2016-11-26 ENCOUNTER — Encounter: Payer: Self-pay | Admitting: Family Medicine

## 2016-11-28 ENCOUNTER — Other Ambulatory Visit: Payer: Self-pay | Admitting: Cardiology

## 2016-12-01 ENCOUNTER — Ambulatory Visit (INDEPENDENT_AMBULATORY_CARE_PROVIDER_SITE_OTHER): Payer: Medicare Other | Admitting: Cardiology

## 2016-12-01 ENCOUNTER — Encounter: Payer: Self-pay | Admitting: Cardiology

## 2016-12-01 VITALS — BP 138/84 | HR 64 | Ht 62.0 in | Wt 124.8 lb

## 2016-12-01 DIAGNOSIS — I10 Essential (primary) hypertension: Secondary | ICD-10-CM

## 2016-12-01 DIAGNOSIS — I359 Nonrheumatic aortic valve disorder, unspecified: Secondary | ICD-10-CM

## 2016-12-01 DIAGNOSIS — I712 Thoracic aortic aneurysm, without rupture: Secondary | ICD-10-CM | POA: Diagnosis not present

## 2016-12-01 DIAGNOSIS — I4510 Unspecified right bundle-branch block: Secondary | ICD-10-CM

## 2016-12-01 DIAGNOSIS — I2510 Atherosclerotic heart disease of native coronary artery without angina pectoris: Secondary | ICD-10-CM | POA: Diagnosis not present

## 2016-12-01 DIAGNOSIS — E7800 Pure hypercholesterolemia, unspecified: Secondary | ICD-10-CM

## 2016-12-01 DIAGNOSIS — I7121 Aneurysm of the ascending aorta, without rupture: Secondary | ICD-10-CM

## 2016-12-01 NOTE — Patient Instructions (Signed)
Medication Instructions:  Your provider recommends that you continue on your current medications as directed. Please refer to the Current Medication list given to you today.    Labwork: TODAY: LFTs, Lipids  Testing/Procedures: Your provider has requested that you have an echocardiogram. Echocardiography is a painless test that uses sound waves to create images of your heart. It provides your doctor with information about the size and shape of your heart and how well your heart's chambers and valves are working. This procedure takes approximately one hour. There are no restrictions for this procedure.    Dr. Radford Pax recommends you have a NUCLEAR STRESS TEST.  Follow-Up: Your provider wants you to follow-up in: 1 year with Dr. Radford Pax. You will receive a reminder letter in the mail two months in advance. If you don't receive a letter, please call our office to schedule the follow-up appointment.    Any Other Special Instructions Will Be Listed Below (If Applicable).     If you need a refill on your cardiac medications before your next appointment, please call your pharmacy.

## 2016-12-01 NOTE — Progress Notes (Signed)
Cardiology Office Note:    Date:  12/01/2016   ID:  [name], [place] 11-Apr-1950, MRN [id number]  PCP:  Martinique, Betty G, MD  Cardiologist:  Fransico Him, MD   Referring MD: Martinique, Betty G, MD   Chief Complaint  Patient presents with  . Coronary Artery Disease  . Hypertension  . Hyperlipidemia  . Aortic Stenosis    History of Present Illness:    Debra [name] is a 66 y.o. female with a hx of thoracic aortic aneurysm(56mm by Chest CT 2017) followed by Dr. Prescott Gum, nonobstructive ASCAD (cath 2012 with 40-50% ostial narrowing of the LAD otherwise normal), HTN, bicuspid AV with mild to moderate AS and dyslipidemia.  She is here today for followup and is doing well.  She denies any chest pain or pressure, SOB, DOE, PND, orthopnea, LE edema, dizziness, palpitations or syncope.    Past Medical History:  Diagnosis Date  . Aortic aneurysm ([place])   . Arthritis    "moderate in back" (11/15/2014)  . Carcinoid tumor of small intestine, malignant ([employer]) dx'd 05/2014  . Colon cancer ([place])   . Coronary artery disease    nonobstructive  . H/O echocardiogram    bicuspid aortic valve with moderate aortic valve sclerosis,mild PR.TR.MR and mildly dilated aorta by echo on 8.2012  . Heart murmur   . Hypertension   . Iron deficiency anemia    "can't tolerate the supplements right now" (11/15/2014)  . Kidney stones    "passed them"  . Mild mitral regurgitation   . Thoracic aortic aneurysm (HCC)    mild, 4.1 cm by recent CT followed by Dr Darcey Nora    Past Surgical History:  Procedure Laterality Date  . ABDOMINAL HYSTERECTOMY    . BOWEL RESECTION N/A 11/15/2014   Procedure: SMALL BOWEL RESECTION;  Surgeon: [name], MD;  Location: [facility];  Service: General;  Laterality: N/A;  . CATARACT EXTRACTION W/ INTRAOCULAR LENS IMPLANT    . COLON SURGERY    . COLONOSCOPY  10/15/11   diverticulosis present, mild severity, no polyps, repeat in 10 years.  Marland Kitchen LAPAROSCOPIC LYSIS OF  ADHESIONS N/A 05/14/2014   Procedure: LAPAROSCOPIC LYSIS OF ADHESIONS x 53mins;  Surgeon: [name], MD;  Location: [facility];  Service: General;  Laterality: N/A;  . LAPAROSCOPIC SMALL BOWEL RESECTION N/A 05/14/2014   Procedure: LAPAROSCOPIC ASSISTED SMALL BOWEL RESECTION;  Surgeon: [name], MD;  Location: [facility];  Service: General;  Laterality: N/A;  . SMALL INTESTINE SURGERY  11/15/2014  . TONSILLECTOMY      Current Medications: Current Meds  Medication Sig  . aspirin 81 MG tablet Take 81 mg by mouth daily.  Marland Kitchen FLUZONE HIGH-DOSE 0.5 ML injection TO BE ADMINISTERED BY PHARMACIST FOR IMMUNIZATION  . metoprolol succinate (TOPROL-XL) 50 MG 24 hr tablet TAKE 1 TABLET (50 MG TOTAL) BY MOUTH DAILY. TAKE WITH OR IMMEDIATELY FOLLOWING A MEAL.  . simvastatin (ZOCOR) 20 MG tablet Take 1 tablet (20 mg total) by mouth daily. Please keep upcoming appointment for further refills     Allergies:   Codeine; Opium; and Penicillins   Social History   Social History  . Marital status: Married    Spouse name: N/A  . Number of children: N/A  . Years of education: N/A   Social History Main Topics  . Smoking status: Never Smoker  . Smokeless tobacco: Never Used  . Alcohol use 2.4 oz/week    4 Glasses of wine per week  . Drug use: No  .  Sexual activity: Yes   Other Topics Concern  . None   Social History Narrative   Married, spouse Aricela Bertagnolli     Family History: The patient's family history includes Breast cancer in her mother; CAD in her maternal grandfather, paternal grandfather, and paternal grandmother; Cancer in her father; Diabetes in her father; Heart attack in her paternal grandmother. There is no history of Stroke or Hypertension.  ROS:   Please see the history of present illness.    ROS  All other systems reviewed and negative.   EKGs/Labs/Other Studies Reviewed:    The following studies were reviewed today: none  EKG:  EKG is  ordered today.  The ekg ordered today  demonstrates NSA at 68bpm with RBB  Recent Labs: 04/07/2016: ALT 10; BUN 18; Creatinine, Ser 0.59; Potassium 4.6; Sodium 142   Recent Lipid Panel    Component Value Date/Time   CHOL 173 04/07/2016 0912   CHOL 141 02/19/2014 0740   TRIG 99.0 04/07/2016 0912   TRIG 92 02/19/2014 0740   HDL 73.50 04/07/2016 0912   HDL 69 02/19/2014 0740   CHOLHDL 2 04/07/2016 0912   VLDL 19.8 04/07/2016 0912   LDLCALC 80 04/07/2016 0912   LDLCALC 54 02/19/2014 0740    Physical Exam:    VS:  BP 138/84   Pulse 64   Ht 5\' 2"  (1.575 m)   Wt 124 lb 12.8 oz (56.6 kg)   BMI 22.83 kg/m     Wt Readings from Last 3 Encounters:  12/01/16 124 lb 12.8 oz (56.6 kg)  09/30/16 123 lb (55.8 kg)  07/30/16 125 lb (56.7 kg)     GEN:  Well nourished, well developed in no acute distress HEENT: Normal NECK: No JVD; No carotid bruits LYMPHATICS: No lymphadenopathy CARDIAC: RRR, no murmurs, rubs, gallops RESPIRATORY:  Clear to auscultation without rales, wheezing or rhonchi  ABDOMEN: Soft, non-tender, non-distended MUSCULOSKELETAL:  No edema; No deformity  SKIN: Warm and dry NEUROLOGIC:  Alert and oriented x 3 PSYCHIATRIC:  Normal affect   ASSESSMENT:    1. Ascending aortic aneurysm ([place])   2. Atherosclerosis of native coronary artery, angina presence unspecified, unspecified whether native or transplanted heart   3. Pure hypercholesterolemia   4. Essential hypertension, benign   5. Aortic valve disorder    PLAN:    In order of problems listed above:  1.  Ascending aortic aneurysm - 65mm by Chest CT angio 03/2015 and 79mm by cardiac MRI 08/2016. She will continue on statin and BB.    2.  Nonobstructive ASCAD - cath 2012 with 40-50% ostial narrowing of the LAD otherwise normal.  She will continue on statin and ASA. She has a new RBBB so I will get a nuclear stress test to rule out ischemia prior to going to [place].  3.  Hyperlipidemia - LDL goal is < 70.  Her last LDL was 80 in Jan 2018.  I will  repeat FLP and ALT.  She will continue on simvastatin 20mg  daily.   4.  HTN - her BP is well controlled on exam today.  She will continue on Toprol 50mg  daily.    5.  Bicuspid AV with mild to moderate AS by echo 11/2014.  I will repeat echo to assess for progression of AS.     Medication Adjustments/Labs and Tests Ordered: Current medicines are reviewed at length with the patient today.  Concerns regarding medicines are outlined above.  No orders of the defined types were placed in  this encounter.  No orders of the defined types were placed in this encounter.   Signed, Fransico Him, MD  12/01/2016 8:23 AM    [place]

## 2016-12-14 ENCOUNTER — Telehealth (HOSPITAL_COMMUNITY): Payer: Self-pay | Admitting: *Deleted

## 2016-12-14 NOTE — Telephone Encounter (Signed)
Patient given detailed instructions per Myocardial Perfusion Study Information Sheet for the test on 12/17/16. Patient notified to arrive 15 minutes early and that it is imperative to arrive on time for appointment to keep from having the test rescheduled.  If you need to cancel or reschedule your appointment, please call the office within 24 hours of your appointment. . Patient verbalized understanding. Jasara Corrigan Jacqueline   

## 2016-12-17 ENCOUNTER — Other Ambulatory Visit: Payer: Medicare Other | Admitting: *Deleted

## 2016-12-17 ENCOUNTER — Ambulatory Visit (HOSPITAL_BASED_OUTPATIENT_CLINIC_OR_DEPARTMENT_OTHER): Payer: Medicare Other

## 2016-12-17 ENCOUNTER — Ambulatory Visit (HOSPITAL_COMMUNITY): Payer: Medicare Other | Attending: Cardiology

## 2016-12-17 DIAGNOSIS — I2510 Atherosclerotic heart disease of native coronary artery without angina pectoris: Secondary | ICD-10-CM | POA: Insufficient documentation

## 2016-12-17 DIAGNOSIS — I10 Essential (primary) hypertension: Secondary | ICD-10-CM | POA: Diagnosis not present

## 2016-12-17 DIAGNOSIS — I359 Nonrheumatic aortic valve disorder, unspecified: Secondary | ICD-10-CM

## 2016-12-17 DIAGNOSIS — Q231 Congenital insufficiency of aortic valve: Secondary | ICD-10-CM | POA: Diagnosis not present

## 2016-12-17 DIAGNOSIS — I4510 Unspecified right bundle-branch block: Secondary | ICD-10-CM | POA: Diagnosis not present

## 2016-12-17 DIAGNOSIS — E785 Hyperlipidemia, unspecified: Secondary | ICD-10-CM | POA: Diagnosis not present

## 2016-12-17 DIAGNOSIS — I340 Nonrheumatic mitral (valve) insufficiency: Secondary | ICD-10-CM | POA: Insufficient documentation

## 2016-12-17 LAB — ECHOCARDIOGRAM COMPLETE
AOPV: 0.34 m/s
AV Area VTI index: 0.75 cm2/m2
AV Area VTI: 1.05 cm2
AV Mean grad: 11 mmHg
AV area mean vel ind: 0.66 cm2/m2
AV vel: 1.19
AVA: 1.19 cm2
AVAREAMEANV: 1.05 cm2
AVCELMEANRAT: 0.33
AVPG: 21 mmHg
AVPKVEL: 227 cm/s
CHL CUP AV PEAK INDEX: 0.66
CHL CUP AV VALUE AREA INDEX: 0.75
CHL CUP DOP CALC LVOT VTI: 19.9 cm
CHL CUP MV DEC (S): 239
CHL CUP TV REG PEAK VELOCITY: 191 cm/s
DOP CAL AO MEAN VELOCITY: 156 cm/s
E decel time: 239 msec
EERAT: 10.25
FS: 37 % (ref 28–44)
IVS/LV PW RATIO, ED: 0.9
LA ID, A-P, ES: 36 mm
LA diam index: 2.28 cm/m2
LA vol index: 27.5 mL/m2
LA vol: 43.4 mL
LAVOLA4C: 38.5 mL
LDCA: 3.14 cm2
LEFT ATRIUM END SYS DIAM: 36 mm
LV E/e' medial: 10.25
LV E/e'average: 10.25
LV PW d: 10 mm — AB (ref 0.6–1.1)
LV TDI E'LATERAL: 8.05
LV TDI E'MEDIAL: 7.07
LVELAT: 8.05 cm/s
LVOT SV: 62 mL
LVOT peak VTI: 0.38 cm
LVOTD: 20 mm
LVOTPV: 76.1 cm/s
Lateral S' vel: 10.6 cm/s
MV pk A vel: 78.1 m/s
MVAP: 3.14 cm2
MVPG: 3 mmHg
MVPKEVEL: 82.5 m/s
P 1/2 time: 529 ms
P 1/2 time: 70 ms
RV TAPSE: 29.2 mm
RV sys press: 18 mmHg
TR max vel: 191 cm/s
TVPG: 191 mmHg
VTI: 226 cm
VTI: 52.4 cm

## 2016-12-17 LAB — HEPATIC FUNCTION PANEL
ALT: 12 IU/L (ref 0–32)
AST: 23 IU/L (ref 0–40)
Albumin: 4.2 g/dL (ref 3.6–4.8)
Alkaline Phosphatase: 112 IU/L (ref 39–117)
Bilirubin Total: 1.6 mg/dL — ABNORMAL HIGH (ref 0.0–1.2)
Bilirubin, Direct: 0.29 mg/dL (ref 0.00–0.40)
TOTAL PROTEIN: 5.9 g/dL — AB (ref 6.0–8.5)

## 2016-12-17 LAB — LIPID PANEL
CHOLESTEROL TOTAL: 151 mg/dL (ref 100–199)
Chol/HDL Ratio: 2.3 ratio (ref 0.0–4.4)
HDL: 66 mg/dL (ref >39)
LDL CALC: 68 mg/dL (ref 0–99)
TRIGLYCERIDES: 85 mg/dL (ref 0–149)
VLDL CHOLESTEROL CAL: 17 mg/dL (ref 5–40)

## 2016-12-17 LAB — MYOCARDIAL PERFUSION IMAGING
CHL CUP NUCLEAR SDS: 0
CHL CUP NUCLEAR SRS: 0
CHL CUP NUCLEAR SSS: 0
CHL CUP RESTING HR STRESS: 64 {beats}/min
CHL RATE OF PERCEIVED EXERTION: 16
CSEPED: 7 min
CSEPHR: 98 %
CSEPPHR: 150 {beats}/min
Estimated workload: 8.5 METS
LV dias vol: 73 mL (ref 46–106)
LVSYSVOL: 25 mL
MPHR: 153 {beats}/min
RATE: 0.24
TID: 1.11

## 2016-12-17 MED ORDER — TECHNETIUM TC 99M TETROFOSMIN IV KIT
32.8000 | PACK | Freq: Once | INTRAVENOUS | Status: AC | PRN
  Administered 2016-12-17: 32.8 via INTRAVENOUS
  Filled 2016-12-17: qty 33

## 2016-12-17 MED ORDER — TECHNETIUM TC 99M TETROFOSMIN IV KIT
10.3000 | PACK | Freq: Once | INTRAVENOUS | Status: AC | PRN
  Administered 2016-12-17: 10.3 via INTRAVENOUS
  Filled 2016-12-17: qty 11

## 2016-12-17 NOTE — Progress Notes (Unsigned)
3D,

## 2016-12-18 ENCOUNTER — Telehealth: Payer: Self-pay | Admitting: Cardiology

## 2016-12-18 NOTE — Telephone Encounter (Signed)
-----   Message from Katrine Coho, RN sent at 12/18/2016  2:18 PM EDT ----- To CMA InBasket

## 2016-12-18 NOTE — Telephone Encounter (Signed)
Called pt and left message for pt to call back for lab results.  °

## 2016-12-19 ENCOUNTER — Encounter: Payer: Self-pay | Admitting: Cardiology

## 2016-12-19 DIAGNOSIS — Q231 Congenital insufficiency of aortic valve: Secondary | ICD-10-CM | POA: Insufficient documentation

## 2016-12-20 ENCOUNTER — Other Ambulatory Visit: Payer: Self-pay | Admitting: Cardiology

## 2016-12-21 ENCOUNTER — Telehealth: Payer: Self-pay

## 2016-12-21 DIAGNOSIS — I359 Nonrheumatic aortic valve disorder, unspecified: Secondary | ICD-10-CM

## 2016-12-21 DIAGNOSIS — Q231 Congenital insufficiency of aortic valve: Secondary | ICD-10-CM

## 2016-12-21 DIAGNOSIS — Q2381 Bicuspid aortic valve: Secondary | ICD-10-CM

## 2016-12-21 MED ORDER — METOPROLOL SUCCINATE ER 50 MG PO TB24: 50.0000 mg | ORAL_TABLET | Freq: Every day | ORAL | 3 refills | Status: DC

## 2016-12-21 NOTE — Telephone Encounter (Signed)
Informed patient of results and verbal understanding expressed.  Repeat ECHO ordered to be scheduled in 2 years. Patient also requests Toprol refills - rx sent. Patient agrees with treatment plan.

## 2016-12-21 NOTE — Telephone Encounter (Signed)
-----   Message from Sueanne Margarita, MD sent at 12/19/2016  2:42 PM EDT ----- Echo showed normal LVF bicuspid AV with with mild AS and trivial AR, mild MR - repeat echo in 2 years.

## 2017-02-01 ENCOUNTER — Telehealth: Payer: Self-pay | Admitting: *Deleted

## 2017-02-01 NOTE — Telephone Encounter (Signed)
Message from pt reporting L upper arm/ shoulder pain for past 1 month. "Doesn't feel like muscle pain but I don't know what bone pain feels like." Wonders if she should be seen sooner in case this is metastasis.  Returned call to pt, she reports L shoulder and arm pain is intermittent though she "can elicit it at any time." Intermittent brief abdominal pain is unchanged (usually in lower abdomen). She has not contacted PCP, pt stated she is OK waiting until next visit if Dr. Benay Spice doesn't think the pain is related to her cancer. Message to MD for review.

## 2017-02-01 NOTE — Telephone Encounter (Signed)
I doubt the pain is related to cancer, she should follow-up with her primary physician if the pain persist

## 2017-02-02 NOTE — Telephone Encounter (Signed)
Left message informing pt of MD recommendation.

## 2017-02-15 ENCOUNTER — Ambulatory Visit: Payer: Medicare Other | Admitting: Family Medicine

## 2017-03-10 ENCOUNTER — Ambulatory Visit (INDEPENDENT_AMBULATORY_CARE_PROVIDER_SITE_OTHER)
Admission: RE | Admit: 2017-03-10 | Discharge: 2017-03-10 | Disposition: A | Discharge status: Home / Self Care | Payer: Medicare Other | Source: Ambulatory Visit | Attending: Family Medicine | Admitting: Family Medicine

## 2017-03-10 ENCOUNTER — Encounter: Payer: Self-pay | Admitting: Family Medicine

## 2017-03-10 ENCOUNTER — Ambulatory Visit: Payer: Medicare Other | Admitting: Family Medicine

## 2017-03-10 VITALS — BP 132/80 | HR 64 | Temp 97.9°F | Resp 12 | Ht 62.0 in | Wt 125.4 lb

## 2017-03-10 DIAGNOSIS — C7A019 Malignant carcinoid tumor of the small intestine, unspecified portion: Secondary | ICD-10-CM | POA: Diagnosis not present

## 2017-03-10 DIAGNOSIS — M79622 Pain in left upper arm: Secondary | ICD-10-CM | POA: Diagnosis not present

## 2017-03-10 DIAGNOSIS — Z23 Encounter for immunization: Secondary | ICD-10-CM

## 2017-03-10 MED ORDER — ZOSTER VAC RECOMB ADJUVANTED 50 MCG/0.5ML IM SUSR: INTRAMUSCULAR | 1 refills | Status: DC

## 2017-03-10 NOTE — Progress Notes (Signed)
ACUTE VISIT   HPI:  Chief Complaint  Patient presents with  . Arm Pain    left arm/muscle wiith movement, concerned it may be connected to cancer, started in October 2018    [name] is a 67 y.o. female, who is here today complaining of 2-3 months of left arm pain, soreness. Pain started at the end of 12/2016, it is mild (1-2/10) most of the time.One time 4/10,she took Tylenol and it helped.  Pain is intermittent, usually in the morning when she first wakes up and resolves spontaneously in 1-2 hours. It is right below left shoulder,soreness, exacerbated by certain shoulder movements. No limitation of ROM. Alleviated by rest. No cervical pain, numbness,tingling,or rash.  Problem is getting better.  She has not noted fever,chills,decreased appetite,or respiratory symptoms. She has Hx of carcinoid tumor, she is concerned about left arm pain being related to malignancy. She has appt with oncologist in a few weeks.  She is also not sure if she needs pneumonia vaccine.   Review of Systems  Constitutional: Negative for chills, fatigue and fever.  HENT: Negative for mouth sores, nosebleeds, sore throat and trouble swallowing.   Respiratory: Negative for cough, shortness of breath and wheezing.   Cardiovascular: Negative for chest pain and palpitations.  Gastrointestinal: Negative for abdominal pain, nausea and vomiting.       No changes in bowel habits.  Genitourinary: Negative for hematuria and urgency.  Musculoskeletal: Positive for arthralgias. Negative for gait problem, joint swelling and neck pain.  Skin: Negative for pallor and rash.  Neurological: Negative for weakness, numbness and headaches.  Hematological: Negative for adenopathy. Does not bruise/bleed easily.  Psychiatric/Behavioral: Negative for confusion. The patient is nervous/anxious.       Current Outpatient Medications on File Prior to Visit  Medication Sig Dispense Refill  .  aspirin 81 MG tablet Take 81 mg by mouth daily.    Marland Kitchen FLUZONE HIGH-DOSE 0.5 ML injection TO BE ADMINISTERED BY PHARMACIST FOR IMMUNIZATION  0  . metoprolol succinate (TOPROL-XL) 50 MG 24 hr tablet Take 1 tablet (50 mg total) by mouth daily. 90 tablet 3  . simvastatin (ZOCOR) 20 MG tablet TAKE 1 TABLET BY MOUTH EVERY DAY 90 tablet 3   No current facility-administered medications on file prior to visit.      Past Medical History:  Diagnosis Date  . Aortic aneurysm ([place])   . Arthritis    "moderate in back" (11/15/2014)  . Bicuspid aortic valve    mild AS by echo 12/2016  . 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 Colon cancer ([place])   . Coronary artery disease    nonobstructive  . H/O echocardiogram    bicuspid aortic valve with moderate aortic valve sclerosis,mild PR.TR.MR and mildly dilated aorta by echo on 8.2012  . Heart murmur   . Hypertension   . Iron deficiency anemia    "can't tolerate the supplements right now" (11/15/2014)  . Kidney stones    "passed them"  . Mild mitral regurgitation   . Thoracic aortic aneurysm (HCC)    mild, 4.1 cm by recent CT followed by Dr Darcey Nora   Allergies  Allergen Reactions  . Codeine Anaphylaxis  . Opium Anaphylaxis  . Penicillins Anaphylaxis    Social History   Socioeconomic History  . Marital status: Married    Spouse name: None  . Number of children: None  . Years of education: None  . Highest education level: None  Social Needs  . Financial resource strain: None  . Food insecurity - worry: None  . Food insecurity - inability: None  . Transportation needs - medical: None  . Transportation needs - non-medical: None  Occupational History  . None  Tobacco Use  . Smoking status: Never Smoker  . Smokeless tobacco: Never Used  Substance and Sexual Activity  . Alcohol use: Yes    Alcohol/week: 2.4 oz    Types: 4 Glasses of wine per week  . Drug use: No  . Sexual activity: Yes  Other Topics Concern  .  None  Social History Narrative   Married, spouse Briahna Pescador    Vitals:   03/10/17 0830  BP: 132/80  Pulse: 64  Resp: 12  Temp: 97.9 F (36.6 C)  SpO2: 98%   Body mass index is 22.93 kg/m.    Physical Exam  Nursing note and vitals reviewed. Constitutional: She is oriented to person, place, and time. She appears well-developed and well-nourished. No distress.  HENT:  Head: Normocephalic and atraumatic.  Mouth/Throat: Oropharynx is clear and moist and mucous membranes are normal.  Eyes: Conjunctivae are normal.  Cardiovascular: Normal rate and regular rhythm.  Murmur (LUSB SEM I-II/VI) heard. Pulses:      Radial pulses are 2+ on the left side.  Respiratory: Effort normal and breath sounds normal. No respiratory distress.  Musculoskeletal: She exhibits no edema.       Left shoulder: She exhibits normal range of motion, no tenderness and no bony tenderness.       Cervical back: She exhibits no tenderness and no bony tenderness.  Pain elicited right below left shoulder with left shoulder internal rotation.She cannot pinpoint area of pain. No tenderness with palpation. Rotator cuff maneuvers do not elicit pain.  Lymphadenopathy:    She has no cervical adenopathy.  Neurological: She is alert and oriented to person, place, and time. She has normal strength. Coordination and gait normal.  Skin: Skin is warm. No rash noted. No erythema.  Psychiatric: She has a normal mood and affect.  Well groomed, good eye contact.     ASSESSMENT AND PLAN:   [name] was seen today for arm pain.  Diagnoses and all orders for this visit:  Left upper arm pain  We discussed possible etiologies, examination and Hx do not suggest metastatic lesion. ? Muscle strain,tendinitis among some possible causes.  Plain imaging ordered today. Pain is usually 1-2/10, she can take OTC Tylenol 500 mg tid as needed. Instructed about warning signs.  -     DG Shoulder Left; Future  Need for  23-polyvalent pneumococcal polysaccharide vaccine -     Pneumococcal polysaccharide vaccine 23-valent greater than or equal to 2yo subcutaneous/IM  Malignant carcinoid tumor of small intestine (HCC)  I do not think this is related to left UE pain. Keep appt with Dr Benay Spice.   Other orders -     Zoster Vaccine Adjuvanted [facility]) injection; 0.5 ml in muscle and repeat in 8 weeks        Return if symptoms worsen or fail to improve.     -[name] was advised to seek immediate medical attention if sudden worsening symptoms or to follow if they persist or if new concerns arise.       Lutie Pickler G. [place], MD  [facility]. [place] office.

## 2017-03-10 NOTE — Patient Instructions (Signed)
A few things to remember from today's visit:   Left upper arm pain - Plan: DG Shoulder Left  Sounds like muscular pain. Today X ray was ordered.  This can be done at [facility] between 8 am and 5 pm: [place]. [phone].   Please be sure medication list is accurate. If a new problem present, please set up appointment sooner than planned today.

## 2017-03-29 ENCOUNTER — Inpatient Hospital Stay: Payer: Medicare Other | Attending: Oncology

## 2017-03-29 DIAGNOSIS — Q231 Congenital insufficiency of aortic valve: Secondary | ICD-10-CM | POA: Insufficient documentation

## 2017-03-29 DIAGNOSIS — C7A019 Malignant carcinoid tumor of the small intestine, unspecified portion: Secondary | ICD-10-CM | POA: Diagnosis present

## 2017-03-29 DIAGNOSIS — D509 Iron deficiency anemia, unspecified: Secondary | ICD-10-CM | POA: Diagnosis not present

## 2017-03-29 DIAGNOSIS — I712 Thoracic aortic aneurysm, without rupture: Secondary | ICD-10-CM | POA: Diagnosis not present

## 2017-03-29 DIAGNOSIS — M79622 Pain in left upper arm: Secondary | ICD-10-CM | POA: Diagnosis not present

## 2017-03-29 DIAGNOSIS — R197 Diarrhea, unspecified: Secondary | ICD-10-CM | POA: Insufficient documentation

## 2017-03-31 LAB — CHROMOGRANIN A: CHROMOGRANIN A: 1 nmol/L (ref 0–5)

## 2017-04-01 ENCOUNTER — Inpatient Hospital Stay (HOSPITAL_BASED_OUTPATIENT_CLINIC_OR_DEPARTMENT_OTHER): Payer: Medicare Other | Admitting: Oncology

## 2017-04-01 ENCOUNTER — Telehealth: Payer: Self-pay | Admitting: Oncology

## 2017-04-01 VITALS — BP 154/68 | HR 60 | Temp 97.8°F | Resp 18 | Ht 62.0 in | Wt 126.4 lb

## 2017-04-01 DIAGNOSIS — M79622 Pain in left upper arm: Secondary | ICD-10-CM | POA: Diagnosis not present

## 2017-04-01 DIAGNOSIS — Q231 Congenital insufficiency of aortic valve: Secondary | ICD-10-CM

## 2017-04-01 DIAGNOSIS — I712 Thoracic aortic aneurysm, without rupture: Secondary | ICD-10-CM | POA: Diagnosis not present

## 2017-04-01 DIAGNOSIS — R197 Diarrhea, unspecified: Secondary | ICD-10-CM

## 2017-04-01 DIAGNOSIS — D509 Iron deficiency anemia, unspecified: Secondary | ICD-10-CM

## 2017-04-01 DIAGNOSIS — C7A019 Malignant carcinoid tumor of the small intestine, unspecified portion: Secondary | ICD-10-CM | POA: Diagnosis not present

## 2017-04-01 NOTE — Telephone Encounter (Signed)
Gave avs and calendar for October  °

## 2017-04-01 NOTE — Progress Notes (Signed)
  [place] OFFICE PROGRESS NOTE   Diagnosis: Carcinoid tumor  INTERVAL HISTORY:   Debra [name] returns as scheduled.  She feels well.  She reports diarrhea occurring only 1 or 2 times per month and spaced months apart.  No flushing.  Good appetite.  She has noted discomfort at the left upper arm since October of last year.  The discomfort is intermittent.  No swelling.  She saw Dr. Martinique.  A plain x-ray of the left shoulder 03/10/2017 was negative.  Objective:  Vital signs in last 24 hours:  Blood pressure (!) 154/68, pulse 60, temperature 97.8 F (36.6 C), temperature source Oral, resp. rate 18, height 5\' 2"  (1.575 m), weight 126 lb 6.4 oz (57.3 kg), SpO2 100 %.    HEENT: Neck without mass Lymphatics: No cervical, supraclavicular, axillary, or inguinal nodes Resp: Lungs with mild bilateral wheeze, good air movement bilaterally, no respiratory distress Cardio: Regular rate and rhythm GI: No hepatomegaly, no mass, no apparent ascites, nontender Vascular: No leg edema Musculoskeletal: No pain with motion of the left shoulder.  Examination of the left upper arm is unremarkable.  No swelling.    Lab Results:  Chromogranin A on 03/29/2017: 1  Medications: I have reviewed the patient's current medications.   Assessment/Plan:  1. Carcinoid tumors involving the the ileum, status post a small bowel resection 05/14/2014 with 1.4 cm and 1.1 cm tumors noted,T4Nx ? The larger tumor showed focal involvement of the serosal surface, resection margins negative, lymphovascular invasion present ? Persistent abdominal pain and nausea, CT showed evidence of a distal small bowel obstruction 10/03/2014 with an octreotide scan 10/17/2014 confirming positive uptake in a mesenteric lymph node ? Small bowel resection 11/15/2014 confirmed multiple small bowel neuroendocrine tumors, grade 2, with metastatic tumor involving 4 lymph nodes  2. History of intermittent muscle pain and  nausea/vomiting secondary to bowel obstruction from #1  3. History of Microcytic anemia-secondary to iron deficiency, likely secondary to bleeding from the small bowel neuroendocrine tumors   4. Thoracic Aortic aneurysm  5. Bicuspid aortic valve   Disposition: Debra [name] remains in clinical remission from the carcinoid tumor.  She will return for an office visit and chromogranin A level in 9 months.  We discussed the indication for surveillance imaging.  She is most comfortable not having surveillance CT scans.  She will seek medical attention if the left arm discomfort progresses.  This is likely related to a benign musculoskeletal condition.  15 minutes were spent with the patient today.  The majority of the time was used for counseling and coordination of care.  Betsy Coder, MD  04/01/2017  10:42 AM

## 2017-04-02 ENCOUNTER — Other Ambulatory Visit: Payer: Self-pay | Admitting: Family Medicine

## 2017-04-02 DIAGNOSIS — Z1231 Encounter for screening mammogram for malignant neoplasm of breast: Secondary | ICD-10-CM

## 2017-05-25 ENCOUNTER — Ambulatory Visit: Payer: Medicare Other

## 2017-06-03 ENCOUNTER — Telehealth: Payer: Self-pay | Admitting: Family Medicine

## 2017-06-03 ENCOUNTER — Ambulatory Visit: Payer: Medicare Other

## 2017-06-03 NOTE — Telephone Encounter (Signed)
Copied from [place]. Topic: Quick Communication - Office Called Patient >> Jun 02, 2017  9:47 AM Dorrene German, RN wrote: Reason for CRM: Patient is scheduled for nurse visit for "tetanus" tomorrow. She has [facility] Medicare as primary insurance. Medicare generally does not cover tetanus vaccine in the office unless patient has an injury that would necessitate vaccination. However, they do usually cover the vaccine at the pharmacy under part D benefits, so I would suggest patient check with her pharmacy/insurance regarding coverage. Left message notifying patient of this and informed her that she may keep schedule appointment if she wishes, but she may be subject to [facility] cost for vaccine. >> Jun 02, 2017 12:54 PM Lolita Rieger, RMA wrote: Pt would like a call back to discuss the price out of pocket for tetanus vaccine >> Jun 03, 2017  8:53 AM Synthia Innocent wrote: Patient calling again, would like call back

## 2017-06-03 NOTE — Telephone Encounter (Signed)
Notified patient of approximate OOP cost for Td or Tdap, which would be $100-$125 including admin fee. Patient stated [facility] told her it would $50 with her insurance, so she will plan to get vaccine at the pharmacy. No further needs at this time.

## 2017-06-08 ENCOUNTER — Ambulatory Visit
Admission: RE | Admit: 2017-06-08 | Discharge: 2017-06-08 | Disposition: A | Discharge status: Home / Self Care | Payer: Medicare Other | Source: Ambulatory Visit | Attending: Family Medicine | Admitting: Family Medicine

## 2017-06-08 DIAGNOSIS — Z1231 Encounter for screening mammogram for malignant neoplasm of breast: Secondary | ICD-10-CM

## 2017-07-23 ENCOUNTER — Ambulatory Visit: Payer: Medicare Other | Admitting: Family Medicine

## 2017-07-23 ENCOUNTER — Encounter: Payer: Self-pay | Admitting: Family Medicine

## 2017-07-23 VITALS — BP 120/74 | HR 94 | Temp 98.0°F | Resp 12 | Ht 62.0 in | Wt 125.5 lb

## 2017-07-23 DIAGNOSIS — M25552 Pain in left hip: Secondary | ICD-10-CM

## 2017-07-23 MED ORDER — NAPROXEN 500 MG PO TABS: 500.0000 mg | ORAL_TABLET | Freq: Two times a day (BID) | ORAL | 0 refills | Status: AC

## 2017-07-23 NOTE — Patient Instructions (Signed)
A few things to remember from today's visit:   Hip pain, left - Plan: naproxen (NAPROSYN) 500 MG tablet  Stretching exercises, local ice,and icy hot with Lidocaine.   Please be sure medication list is accurate. If a new problem present, please set up appointment sooner than planned today.

## 2017-07-23 NOTE — Progress Notes (Signed)
ACUTE VISIT   HPI:  Chief Complaint  Patient presents with  . Left leg pain    muscle pain when walking, going up steps and lifting that started 6 weeks ago    Ms.Shineka Auble is a 67 y.o. female, who is here today complaining of intermittent left thigh pain that started about 6 weeks ago,anterior aspect.   Sudden onset. It is exacerbated by certain movements, hip flexion. Alleviated by rest. Most of the time pain is achy, 2/10 with episodes of more severe,sharp pain the last a few minutes. Came back from [place] a week ago, long walks while she was there,which aggravated pain. Pain started before traveling.  No unusual activity or injury. Limiting hip flexion. Occasional, very mild lower back pain, no radiated.  She has no noted chills, fever, local edema/erythema, or skin rash. She took Acetaminophen a couple times but did not notice any improvement so she discontinued.  Pain has improved for the past 2 days.   Review of Systems  Constitutional: Negative for chills, fatigue and fever.  Respiratory: Negative for shortness of breath and wheezing.   Cardiovascular: Negative for chest pain, palpitations and leg swelling.  Gastrointestinal: Negative for abdominal pain, nausea and vomiting.       No changes in bowel habits.  Genitourinary: Negative for decreased urine volume and hematuria.  Musculoskeletal: Positive for back pain (Occasional and mild) and myalgias. Negative for gait problem and joint swelling.  Skin: Negative for color change and rash.  Neurological: Negative for weakness and numbness.      Current Outpatient Medications on File Prior to Visit  Medication Sig Dispense Refill  . aspirin 81 MG tablet Take 81 mg by mouth daily.    . clindamycin (CLEOCIN) 150 MG capsule Take 600 mg by mouth once. Before dental visits    . FLUZONE HIGH-DOSE 0.5 ML injection TO BE ADMINISTERED BY PHARMACIST FOR IMMUNIZATION  0  . metoprolol succinate  (TOPROL-XL) 50 MG 24 hr tablet Take 1 tablet (50 mg total) by mouth daily. 90 tablet 3  . simvastatin (ZOCOR) 20 MG tablet TAKE 1 TABLET BY MOUTH EVERY DAY 90 tablet 3  . Zoster Vaccine Adjuvanted [facility]) injection 0.5 ml in muscle and repeat in 8 weeks (Patient not taking: Reported on 07/23/2017) 0.5 mL 1   No current facility-administered medications on file prior to visit.      Past Medical History:  Diagnosis Date  . Aortic aneurysm ([place])   . Arthritis    "moderate in back" (11/15/2014)  . Bicuspid aortic valve    mild AS by echo 12/2016  . Carcinoid tumor of small intestine, malignant ([place]) dx'd 05/2014  . Colon cancer ([place])   . Coronary artery disease    nonobstructive  . H/O echocardiogram    bicuspid aortic valve with moderate aortic valve sclerosis,mild PR.TR.MR and mildly dilated aorta by echo on 8.2012  . Heart murmur   . Hypertension   . Iron deficiency anemia    "can't tolerate the supplements right now" (11/15/2014)  . Kidney stones    "passed them"  . Mild mitral regurgitation   . Thoracic aortic aneurysm ([facility])    mild, 4.1 cm by recent CT followed by Dr Darcey Nora   Allergies  Allergen Reactions  . Codeine Anaphylaxis and Shortness Of Breath  . Opium Anaphylaxis  . Oxycodone Shortness Of Breath  . Penicillins Anaphylaxis and Shortness Of Breath    Social History   Socioeconomic History  .  Marital status: Married    Spouse name: Not on file  . Number of children: Not on file  . Years of education: Not on file  . Highest education level: Not on file  Occupational History  . Not on file  Social Needs  . Financial resource strain: Not on file  . Food insecurity:    Worry: Not on file    Inability: Not on file  . Transportation needs:    Medical: Not on file    Non-medical: Not on file  Tobacco Use  . Smoking status: Never Smoker  . Smokeless tobacco: Never Used  Substance and Sexual Activity  . Alcohol use: Yes    Alcohol/week: 2.4 oz    Types: 4  Glasses of wine per week  . Drug use: No  . Sexual activity: Yes  Lifestyle  . Physical activity:    Days per week: Not on file    Minutes per session: Not on file  . Stress: Not on file  Relationships  . Social connections:    Talks on phone: Not on file    Gets together: Not on file    Attends religious service: Not on file    Active member of club or organization: Not on file    Attends meetings of clubs or organizations: Not on file    Relationship status: Not on file  Other Topics Concern  . Not on file  Social History Narrative   Married, spouse Chrissie Dacquisto    Vitals:   07/23/17 0915  BP: 120/74  Pulse: 94  Resp: 12  Temp: 98 F (36.7 C)  SpO2: 98%   Body mass index is 22.95 kg/m.   Physical Exam  Nursing note and vitals reviewed. Constitutional: She is oriented to person, place, and time. She appears well-developed and well-nourished. She does not appear ill. No distress.  HENT:  Head: Normocephalic and atraumatic.  Eyes: Conjunctivae are normal.  Cardiovascular: Normal rate and regular rhythm.  Pulses:      Dorsalis pedis pulses are 2+ on the right side, and 2+ on the left side.  Respiratory: Effort normal and breath sounds normal. No respiratory distress.  GI: Soft. She exhibits no mass. There is no hepatomegaly. There is no tenderness. No hernia. Hernia confirmed negative in the right inguinal area and confirmed negative in the left inguinal area.  Musculoskeletal: She exhibits no edema.       Left hip: She exhibits normal strength and no tenderness.  Mild tenderness elicited with full left hip flexion,internal rotation,and abduction.  Neurological: She is alert and oriented to person, place, and time. She has normal strength. Gait normal.  Skin: Skin is warm. No rash noted. No erythema.  Psychiatric: She has a normal mood and affect.  Well groomed, good eye contact.      ASSESSMENT AND PLAN:   Ms. Iveth was seen today for left leg  pain.  Diagnoses and all orders for this visit:  Hip pain, left  Possible causes discussed. No Hx of trauma, so I do not think imaging is needed today. ? Muscle sprain. Recommend short course of Naproxen. OTC Icy hot or Asper cream with Lidocaine may also help.  F/U as needed.  -     naproxen (NAPROSYN) 500 MG tablet; Take 1 tablet (500 mg total) by mouth 2 (two) times daily with a meal for 10 days.     Heaven Meeker G. Martinique, MD  [facility]. [place] office.

## 2017-07-24 ENCOUNTER — Encounter: Payer: Self-pay | Admitting: Family Medicine

## 2017-12-27 ENCOUNTER — Inpatient Hospital Stay: Payer: Medicare Other | Attending: Oncology

## 2017-12-27 DIAGNOSIS — I712 Thoracic aortic aneurysm, without rupture: Secondary | ICD-10-CM | POA: Insufficient documentation

## 2017-12-27 DIAGNOSIS — R197 Diarrhea, unspecified: Secondary | ICD-10-CM | POA: Insufficient documentation

## 2017-12-27 DIAGNOSIS — C7A019 Malignant carcinoid tumor of the small intestine, unspecified portion: Secondary | ICD-10-CM | POA: Diagnosis present

## 2017-12-27 DIAGNOSIS — D509 Iron deficiency anemia, unspecified: Secondary | ICD-10-CM | POA: Diagnosis not present

## 2017-12-27 DIAGNOSIS — Q231 Congenital insufficiency of aortic valve: Secondary | ICD-10-CM | POA: Diagnosis not present

## 2017-12-28 LAB — CHROMOGRANIN A: Chromogranin A: 1 nmol/L (ref 0–5)

## 2017-12-30 ENCOUNTER — Inpatient Hospital Stay: Payer: Medicare Other | Admitting: Oncology

## 2017-12-30 ENCOUNTER — Telehealth: Payer: Self-pay | Admitting: Oncology

## 2017-12-30 VITALS — BP 134/65 | HR 71 | Temp 98.1°F | Resp 17 | Ht 62.0 in | Wt 124.4 lb

## 2017-12-30 DIAGNOSIS — R197 Diarrhea, unspecified: Secondary | ICD-10-CM

## 2017-12-30 DIAGNOSIS — C7A019 Malignant carcinoid tumor of the small intestine, unspecified portion: Secondary | ICD-10-CM | POA: Diagnosis not present

## 2017-12-30 DIAGNOSIS — I712 Thoracic aortic aneurysm, without rupture: Secondary | ICD-10-CM

## 2017-12-30 DIAGNOSIS — D509 Iron deficiency anemia, unspecified: Secondary | ICD-10-CM

## 2017-12-30 DIAGNOSIS — Q231 Congenital insufficiency of aortic valve: Secondary | ICD-10-CM

## 2017-12-30 NOTE — Progress Notes (Signed)
  [place] OFFICE PROGRESS NOTE   Diagnosis: Carcinoid tumor  INTERVAL HISTORY:   Ms. Becvar returns as scheduled.  She feels well.  Good appetite and energy level.  She has a few days of diarrhea each month.  No consistent diarrhea.  No other complaint.  Objective:  Vital signs in last 24 hours:  Blood pressure 134/65, pulse 71, temperature 98.1 F (36.7 C), temperature source Oral, resp. rate 17, height 5\' 2"  (1.575 m), weight 124 lb 6.4 oz (56.4 kg), SpO2 100 %.    HEENT: Neck without mass Lymphatics: No cervical, supraclavicular, axillary, or inguinal nodes Resp: Lungs clear bilaterally Cardio: Regular rate and rhythm GI: No hepatomegaly, no mass, nontender Vascular: No leg edema  Lab Results:  Chromogranin A on 12/27/2017: 1 Medications: I have reviewed the patient's current medications.   Assessment/Plan:  1. Carcinoid tumors involving the the ileum, status post a small bowel resection 05/14/2014 with 1.4 cm and 1.1 cm tumors noted,T4Nx ? The larger tumor showed focal involvement of the serosal surface, resection margins negative, lymphovascular invasion present ? Persistent abdominal pain and nausea, CT showed evidence of a distal small bowel obstruction 10/03/2014 with an octreotide scan 10/17/2014 confirming positive uptake in a mesenteric lymph node ? Small bowel resection 11/15/2014 confirmed multiple small bowel neuroendocrine tumors, grade 2, with metastatic tumor involving 4 lymph nodes  2. History of intermittent muscle pain and nausea/vomiting secondary to bowel obstruction from #1  3. 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 Thoracic Aortic aneurysm  5. Bicuspid aortic valve   Disposition: Ms. Woodham is in clinical remission from the carcinoid tumor.  We discussed the indication for restaging CTs.  She is comfortable with  observation and not undergoing a surveillance CT scan.  She will return for an office visit and chromogranin A level in 1 year.  She will contact us in the interim for new symptoms.  15 minutes were spent with the patient today.  The majority of the time was used for counseling and coordination of care.  Betsy Coder, MD  12/30/2017  8:43 AM

## 2017-12-30 NOTE — Telephone Encounter (Signed)
Appts scheudled avs/calendar printed per 10/24 los

## 2017-12-30 NOTE — Progress Notes (Signed)
error 

## 2018-01-03 ENCOUNTER — Encounter: Payer: Self-pay | Admitting: Cardiology

## 2018-01-03 ENCOUNTER — Ambulatory Visit: Payer: Medicare Other | Admitting: Cardiology

## 2018-01-03 VITALS — BP 138/82 | HR 67 | Ht 62.0 in | Wt 125.4 lb

## 2018-01-03 DIAGNOSIS — I10 Essential (primary) hypertension: Secondary | ICD-10-CM | POA: Diagnosis not present

## 2018-01-03 DIAGNOSIS — I2510 Atherosclerotic heart disease of native coronary artery without angina pectoris: Secondary | ICD-10-CM | POA: Diagnosis not present

## 2018-01-03 DIAGNOSIS — I712 Thoracic aortic aneurysm, without rupture: Secondary | ICD-10-CM

## 2018-01-03 DIAGNOSIS — I7121 Aneurysm of the ascending aorta, without rupture: Secondary | ICD-10-CM

## 2018-01-03 DIAGNOSIS — Q231 Congenital insufficiency of aortic valve: Secondary | ICD-10-CM

## 2018-01-03 DIAGNOSIS — I350 Nonrheumatic aortic (valve) stenosis: Secondary | ICD-10-CM

## 2018-01-03 DIAGNOSIS — E7800 Pure hypercholesterolemia, unspecified: Secondary | ICD-10-CM

## 2018-01-03 LAB — LIPID PANEL
CHOL/HDL RATIO: 2.1 ratio (ref 0.0–4.4)
Cholesterol, Total: 170 mg/dL (ref 100–199)
HDL: 81 mg/dL (ref >39)
LDL CALC: 71 mg/dL (ref 0–99)
TRIGLYCERIDES: 89 mg/dL (ref 0–149)
VLDL CHOLESTEROL CAL: 18 mg/dL (ref 5–40)

## 2018-01-03 LAB — HEPATIC FUNCTION PANEL
ALK PHOS: 112 IU/L (ref 39–117)
ALT: 10 IU/L (ref 0–32)
AST: 16 IU/L (ref 0–40)
Albumin: 4.3 g/dL (ref 3.6–4.8)
BILIRUBIN TOTAL: 1.4 mg/dL — AB (ref 0.0–1.2)
Bilirubin, Direct: 0.32 mg/dL (ref 0.00–0.40)
Total Protein: 5.9 g/dL — ABNORMAL LOW (ref 6.0–8.5)

## 2018-01-03 NOTE — Patient Instructions (Addendum)
Medication Instructions:  Your physician recommends that you continue on your current medications as directed. Please refer to the Current Medication list given to you today.  If you need a refill on your cardiac medications before your next appointment, please call your pharmacy.   Lab work: Today: LFT and Lipids Future: BMET before Cardiac MRI  If you have labs (blood work) drawn today and your tests are completely normal, you will receive your results only by: Marland Kitchen MyChart Message (if you have MyChart) OR . A paper copy in the mail If you have any lab test that is abnormal or we need to change your treatment, we will call you to review the results.  Testing/Procedures: Your physician has requested that you have an echocardiogram in 12/2018. Echocardiography is a painless test that uses sound waves to create images of your heart. It provides your doctor with information about the size and shape of your heart and how well your heart's chambers and valves are working. This procedure takes approximately one hour. There are no restrictions for this procedure.  Your physician has requested that you have a cardiac MRI. Cardiac MRI uses a computer to create images of your heart as its beating, producing both still and moving pictures of your heart and major blood vessels. For further information please visit [email or web address] Please follow the instruction sheet given to you today for more information.  Follow-Up: At [facility], you and your health needs are our priority.  As part of our continuing mission to provide you with exceptional heart care, we have created designated Provider Care Teams.  These Care Teams include your primary Cardiologist (physician) and Advanced Practice Providers (APPs -  Physician Assistants and Nurse Practitioners) who all work together to provide you with the care you need, when you need it. You will need a follow up appointment in 1 years.  Please call our office 2  months in advance to schedule this appointment.  You may see Dr. Radford Pax or one of the following Advanced Practice Providers on your designated Care Team:   Staley, PA-C Melina Copa, PA-C . Ermalinda Barrios, PA-C

## 2018-01-03 NOTE — Progress Notes (Signed)
Cardiology Office Note:    Date:  01/03/2018   ID:  Debra [name], [place] 03-Dec-1950, MRN [id number]  PCP:  Debra, Betty G, MD  Cardiologist:  No primary care provider on file.    Referring MD: Debra, Betty G, MD   Chief Complaint  Patient presents with  . Follow-up    BAV with mild AS, HTN, thoracici aortic aneurysm    History of Present Illness:    Debra [name] is a 67 y.o. female with a hx of thoracic aortic aneurysm(65mm by Chest CT 2017) followed by Dr. Prescott Gum, nonobstructive ASCAD (cath 2012 with 40-50% ostial narrowing of the LAD otherwise normal), HTN, bicuspid AV with mild to moderate AS and dyslipidemia.  She is here today for followup and is doing well.  She denies any chest pain or pressure, SOB, DOE, PND, orthopnea, LE edema, dizziness, palpitations or syncope. She is compliant with her meds and is tolerating meds with no SE.    Past Medical History:  Diagnosis Date  . Aortic aneurysm ([place])   . Arthritis    "moderate in back" (11/15/2014)  . Bicuspid aortic valve    mild AS by echo 12/2016  . 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 Colon cancer ([place])   . Coronary artery disease    nonobstructive  . H/O echocardiogram    bicuspid aortic valve with moderate aortic valve sclerosis,mild PR.TR.MR and mildly dilated aorta by echo on 8.2012  . Heart murmur   . Hypertension   . Iron deficiency anemia    "can't tolerate the supplements right now" (11/15/2014)  . Kidney stones    "passed them"  . Mild mitral regurgitation   . Thoracic aortic aneurysm (H[facility])    mild, 4.1 cm by recent CT followed by Dr Darcey Nora    Past Surgical History:  Procedure Laterality Date  . ABDOMINAL HYSTERECTOMY    . BOWEL RESECTION N/A 11/15/2014   Procedure: SMALL BOWEL RESECTION;  Surgeon: [name], MD;  Location: [facility];  Service: General;  Laterality: N/A;  . CATARACT EXTRACTION W/ INTRAOCULAR LENS IMPLANT    . COLON SURGERY    .  COLONOSCOPY  10/15/11   diverticulosis present, mild severity, no polyps, repeat in 10 years.  Marland Kitchen LAPAROSCOPIC LYSIS OF ADHESIONS N/A 05/14/2014   Procedure: LAPAROSCOPIC LYSIS OF ADHESIONS x 55mins;  Surgeon: [name], MD;  Location: [facility];  Service: General;  Laterality: N/A;  . LAPAROSCOPIC SMALL BOWEL RESECTION N/A 05/14/2014   Procedure: LAPAROSCOPIC ASSISTED SMALL BOWEL RESECTION;  Surgeon: [name], MD;  Location: [facility];  Service: General;  Laterality: N/A;  . SMALL INTESTINE SURGERY  11/15/2014  . TONSILLECTOMY      Current Medications: Current Meds  Medication Sig  . aspirin 81 MG tablet Take 81 mg by mouth daily.  . clindamycin (CLEOCIN) 150 MG capsule Take 600 mg by mouth once. Before dental visits  . metoprolol succinate (TOPROL-XL) 50 MG 24 hr tablet Take 1 tablet (50 mg total) by mouth daily.  . simvastatin (ZOCOR) 20 MG tablet TAKE 1 TABLET BY MOUTH EVERY DAY     Allergies:   Codeine; Opium; Oxycodone; and Penicillins   Social History   Socioeconomic History  . Marital status: Married    Spouse name: Not on file  . Number of children: Not on file  . Years of education: Not on file  . Highest education level: Not on file  Occupational History  . Not on file  Social Needs  . Financial resource strain: Not on file  . Food insecurity:    Worry: Not on file    Inability: Not on file  . Transportation needs:    Medical: Not on file    Non-medical: Not on file  Tobacco Use  . Smoking status: Never Smoker  . Smokeless tobacco: Never Used  Substance and Sexual Activity  . Alcohol use: Yes    Alcohol/week: 4.0 standard drinks    Types: 4 Glasses of wine per week  . Drug use: No  . Sexual activity: Yes  Lifestyle  . Physical activity:    Days per week: Not on file    Minutes per session: Not on file  . Stress: Not on file  Relationships  . Social connections:    Talks on phone: Not on file    Gets together: Not on file    Attends religious service: Not  on file    Active member of club or organization: Not on file    Attends meetings of clubs or organizations: Not on file    Relationship status: Not on file  Other Topics Concern  . Not on file  Social History Narrative   Married, spouse Takoda Siedlecki     Family History: The patient's family history includes Breast cancer in her mother; CAD in her maternal grandfather, paternal grandfather, and paternal grandmother; Cancer in her father; Diabetes in her father; Heart attack in her paternal grandmother. There is no history of Stroke or Hypertension.  ROS:   Please see the history of present illness.    ROS  All other systems reviewed and negative.   EKGs/Labs/Other Studies Reviewed:    The following studies were reviewed today: none  EKG:  EKG is  ordered today.  The ekg ordered today demonstrates normal sinus rhythm with right bundle branch block.  Recent Labs: No results found for requested labs within last 8760 hours.   Recent Lipid Panel    Component Value Date/Time   CHOL 151 12/17/2016 0831   CHOL 141 02/19/2014 0740   TRIG 85 12/17/2016 0831   TRIG 92 02/19/2014 0740   HDL 66 12/17/2016 0831   HDL 69 02/19/2014 0740   CHOLHDL 2.3 12/17/2016 0831   CHOLHDL 2 04/07/2016 0912   VLDL 19.8 04/07/2016 0912   LDLCALC 68 12/17/2016 0831   LDLCALC 54 02/19/2014 0740    Physical Exam:    VS:  BP 138/82   Pulse 67   Ht 5\' 2"  (1.575 m)   Wt 125 lb 6.4 oz (56.9 kg)   SpO2 99%   BMI 22.94 kg/m     Wt Readings from Last 3 Encounters:  01/03/18 125 lb 6.4 oz (56.9 kg)  12/30/17 124 lb 6.4 oz (56.4 kg)  07/23/17 125 lb 8 oz (56.9 kg)     GEN:  Well nourished, well developed in no acute distress HEENT: Normal NECK: No JVD; No carotid bruits LYMPHATICS: No lymphadenopathy CARDIAC: RRR, no murmurs, rubs, gallops RESPIRATORY:  Clear to auscultation without rales, wheezing or rhonchi  ABDOMEN: Soft, non-tender, non-distended MUSCULOSKELETAL:  No edema; No deformity    SKIN: Warm and dry NEUROLOGIC:  Alert and oriented x 3 PSYCHIATRIC:  Normal affect   ASSESSMENT:    1. Atherosclerosis of native coronary artery of native heart without angina pectoris   2. Essential hypertension, benign   3. Bicuspid aortic valve   4. Ascending aortic aneurysm ([place])   5. Pure hypercholesterolemia    PLAN:  In order of problems listed above:  1.  ASCAD - nonobstructive with cath 2012 showing 40-50% ostial narrowing of the LAD otherwise normal -she will continue on aspirin, beta-blocker and statin therapy.  She has not had any anginal symptoms.  2.  HTN -BP is well controlled on exam today.  She will continue on Toprol-XL 50 mg daily.  3.  Bicuspid AV -echo a year ago showed mild aortic stenosis.  I will repeat an echo in 1 year.  4.  Ascending aortic aneurysm -CT of the chest 03/20/2015 showed stable 4.1 cm aneurysm.  MRI of the chest on 08/19/2016 showed 12.1 cm aneurysm.  This was not appreciated on her last echo.  I will repeat MRI of the chest to make sure that is stable.  Her BP is well controlled.  She will continue on statin beta-blocker and aspirin.  5.  Hyperlipidemia -LDL goal is less than 70 last LDL was 60 on 12/17/2016.  I will repeat an FLP and ALT.  She will continue on simvastatin 20 mg daily.   Medication Adjustments/Labs and Tests Ordered: Current medicines are reviewed at length with the patient today.  Concerns regarding medicines are outlined above.  No orders of the defined types were placed in this encounter.  No orders of the defined types were placed in this encounter.   Signed, Fransico Him, MD  01/03/2018 8:49 AM    [place]

## 2018-01-03 NOTE — Addendum Note (Signed)
Addended by: Sarina Ill on: 01/03/2018 09:11 AM   Modules accepted: Orders

## 2018-01-10 NOTE — Addendum Note (Signed)
Addended by: Sarina Ill on: 01/10/2018 08:10 AM   Modules accepted: Orders

## 2018-01-21 ENCOUNTER — Ambulatory Visit (HOSPITAL_COMMUNITY): Payer: Medicare Other

## 2018-02-23 ENCOUNTER — Other Ambulatory Visit: Payer: Self-pay | Admitting: Cardiology

## 2018-03-23 ENCOUNTER — Telehealth: Payer: Self-pay | Admitting: Cardiology

## 2018-03-23 NOTE — Telephone Encounter (Signed)
Spoke with the patient, she has been getting increasingly worried about this test and believes she would need Valium to be able to complete the test.

## 2018-03-23 NOTE — Telephone Encounter (Signed)
New Message:    Patient calling wanting know about taking some medication. Please call patient. Or patient can be called on cell phone [phone]

## 2018-03-24 NOTE — Telephone Encounter (Signed)
Please give her a Rx for Valium 5mg  (#1 tablet with no refills) to take 1/2 hour before her MRI.  She will need to have someone drive her to the MRI and take her home.

## 2018-03-25 MED ORDER — DIAZEPAM 5 MG PO TABS: ORAL_TABLET | ORAL | 0 refills | Status: DC

## 2018-03-25 NOTE — Telephone Encounter (Signed)
Pharmacy accepted and is filling the prescription.

## 2018-03-25 NOTE — Telephone Encounter (Signed)
Spoke with the patient she expressed understanding about how to take Valium and said her husband would drive her to the MRI. Calling pharmacy to order medication.

## 2018-03-29 ENCOUNTER — Ambulatory Visit (HOSPITAL_COMMUNITY)
Admission: RE | Admit: 2018-03-29 | Discharge: 2018-03-29 | Disposition: A | Discharge status: Home / Self Care | Payer: Medicare Other | Source: Ambulatory Visit | Attending: Cardiology | Admitting: Cardiology

## 2018-03-29 DIAGNOSIS — I7121 Aneurysm of the ascending aorta, without rupture: Secondary | ICD-10-CM

## 2018-03-29 DIAGNOSIS — I712 Thoracic aortic aneurysm, without rupture: Secondary | ICD-10-CM | POA: Diagnosis present

## 2018-03-29 LAB — CREATININE, SERUM: CREATININE: 0.7 mg/dL (ref 0.44–1.00)

## 2018-03-29 MED ORDER — GADOBUTROL 1 MMOL/ML IV SOLN
5.0000 mL | Freq: Once | INTRAVENOUS | Status: AC | PRN
  Administered 2018-03-29: 5 mL via INTRAVENOUS

## 2018-03-30 ENCOUNTER — Other Ambulatory Visit: Payer: Self-pay | Admitting: Cardiology

## 2018-04-01 ENCOUNTER — Telehealth: Payer: Self-pay | Admitting: Cardiology

## 2018-04-01 DIAGNOSIS — I7121 Aneurysm of the ascending aorta, without rupture: Secondary | ICD-10-CM

## 2018-04-01 DIAGNOSIS — I712 Thoracic aortic aneurysm, without rupture: Secondary | ICD-10-CM

## 2018-04-01 NOTE — Telephone Encounter (Signed)
The patient has been notified of the result and verbalized understanding.  All questions (if any) were answered. Sarina Ill, RN 04/01/2018 10:42 AM

## 2018-04-01 NOTE — Telephone Encounter (Signed)
New Message:    Patient states that the nurse called today concerning some results. Please call patient back.

## 2018-04-01 NOTE — Telephone Encounter (Signed)
Notes recorded by Sueanne Margarita, MD on 03/29/2018 at 4:58 PM EST Please let patient know that Cardiac MRI showed stable ascending aortic aneurysm at 4.1cm. Repeat Cardiac MRI in 1 year to assess for progression

## 2018-04-28 ENCOUNTER — Other Ambulatory Visit: Payer: Self-pay | Admitting: Oncology

## 2018-04-28 DIAGNOSIS — C7A019 Malignant carcinoid tumor of the small intestine, unspecified portion: Secondary | ICD-10-CM

## 2018-06-07 ENCOUNTER — Other Ambulatory Visit: Payer: Self-pay | Admitting: Cardiothoracic Surgery

## 2018-06-07 DIAGNOSIS — I712 Thoracic aortic aneurysm, without rupture, unspecified: Secondary | ICD-10-CM

## 2018-07-06 ENCOUNTER — Other Ambulatory Visit: Payer: Self-pay | Admitting: Family Medicine

## 2018-07-06 DIAGNOSIS — Z1231 Encounter for screening mammogram for malignant neoplasm of breast: Secondary | ICD-10-CM

## 2018-08-31 ENCOUNTER — Ambulatory Visit: Payer: Medicare Other | Admitting: Cardiothoracic Surgery

## 2018-09-06 ENCOUNTER — Ambulatory Visit
Admission: RE | Admit: 2018-09-06 | Discharge: 2018-09-06 | Disposition: A | Discharge status: Home / Self Care | Payer: Medicare Other | Source: Ambulatory Visit | Attending: Family Medicine | Admitting: Family Medicine

## 2018-09-06 ENCOUNTER — Other Ambulatory Visit: Payer: Self-pay

## 2018-09-06 DIAGNOSIS — Z1231 Encounter for screening mammogram for malignant neoplasm of breast: Secondary | ICD-10-CM

## 2018-09-07 ENCOUNTER — Ambulatory Visit: Payer: Medicare Other | Admitting: Cardiothoracic Surgery

## 2018-09-07 ENCOUNTER — Encounter: Payer: Self-pay | Admitting: Cardiothoracic Surgery

## 2018-09-07 VITALS — BP 127/75 | HR 75 | Temp 97.3°F | Resp 16 | Ht 62.0 in | Wt 123.0 lb

## 2018-09-07 DIAGNOSIS — Q231 Congenital insufficiency of aortic valve: Secondary | ICD-10-CM | POA: Diagnosis not present

## 2018-09-07 DIAGNOSIS — I712 Thoracic aortic aneurysm, without rupture, unspecified: Secondary | ICD-10-CM

## 2018-09-07 NOTE — Progress Notes (Signed)
PCP is Martinique, Betty G, MD Referring Provider is Carol Ada, MD  Chief Complaint  Patient presents with  . TAA    1 yr f/u with MRA CHEST on 03/29/18 ordered by Dr. Fransico Him    HPI: 93-monthfollow-up with MRI for 4.1 cm fusiform ascending aneurysm, asymptomatic and associated with a bicuspid aortic valve.  She takes metoprolol for blood pressure.  She remains active with yoga and tai chi.  She denies chest pain or shortness of breath.  She did have some claustrophobia with the last MRI.  I reviewed the MRA  images from earlier this year.  The ascending aorta remains minimally dilated and stable at 4.1 cm.  There is no mural thickening or ulceration.   Past Medical History:  Diagnosis Date  . Aortic aneurysm ([place]   . Arthritis    "moderate in back" (11/15/2014)  . Bicuspid aortic valve    mild AS by echo 12/2016  . 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 Colon cancer ([place]   . Coronary artery disease    nonobstructive  . H/O echocardiogram    bicuspid aortic valve with moderate aortic valve sclerosis,mild PR.TR.MR and mildly dilated aorta by echo on 8.2012  . Heart murmur   . Hypertension   . Iron deficiency anemia    "can't tolerate the supplements right now" (11/15/2014)  . Kidney stones    "passed them"  . Mild mitral regurgitation   . Thoracic aortic aneurysm (HCC)    mild, 4.1 cm by recent CT followed by Dr VDarcey Nora   Past Surgical History:  Procedure Laterality Date  . ABDOMINAL HYSTERECTOMY    . BOWEL RESECTION N/A 11/15/2014   Procedure: SMALL BOWEL RESECTION;  Surgeon: BGeorganna Skeans MD;  [place]  Service: General;  Laterality: N/A;  . CATARACT EXTRACTION W/ INTRAOCULAR LENS IMPLANT    . COLON SURGERY    . COLONOSCOPY  10/15/11   diverticulosis present, mild severity, no polyps, repeat in 10 years.  .Marland KitchenLAPAROSCOPIC LYSIS OF ADHESIONS N/A 05/14/2014   Procedure: LAPAROSCOPIC LYSIS OF ADHESIONS x 161ms;  Surgeon: BuGeorganna SkeansMD;  [place] Service: General;  Laterality: N/A;  . LAPAROSCOPIC SMALL BOWEL RESECTION N/A 05/14/2014   Procedure: LAPAROSCOPIC ASSISTED SMALL BOWEL RESECTION;  Surgeon: BuGeorganna SkeansMD;  [place] Service: General;  Laterality: N/A;  . SMALL INTESTINE SURGERY  11/15/2014  . TONSILLECTOMY      Family History  Problem Relation Age of Onset  . CAD Maternal Grandfather   . CAD Paternal Grandmother   . Heart attack Paternal Grandmother   . CAD Paternal Grandfather   . Breast cancer Mother   . Cancer Father   . Diabetes Father   . Stroke Neg Hx   . Hypertension Neg Hx     Social History Social History   Tobacco Use  . Smoking status: Never Smoker  . Smokeless tobacco: Never Used  Substance Use Topics  . Alcohol use: Yes    Alcohol/week: 4.0 standard drinks    Types: 4 Glasses of wine per week  . Drug use: No    Current Outpatient Medications  Medication Sig Dispense Refill  . aspirin 81 MG tablet Take 81 mg by mouth daily.    . clindamycin (CLEOCIN) 150 MG capsule Take 600 mg by mouth once. Before dental visits    . diazepam (VALIUM) 5 MG tablet Take the 5 mg tablet, by mouth, 30 minutes  before your cardiac MRI. 1 tablet 0  . metoprolol succinate (TOPROL-XL) 50 MG 24 hr tablet TAKE 1 TABLET BY MOUTH EVERY DAY 90 tablet 3  . simvastatin (ZOCOR) 20 MG tablet TAKE 1 TABLET BY MOUTH EVERY DAY 90 tablet 2   No current facility-administered medications for this visit.     Allergies  Allergen Reactions  . Codeine Anaphylaxis and Shortness Of Breath  . Opium Anaphylaxis  . Oxycodone Shortness Of Breath  . Penicillins Anaphylaxis and Shortness Of Breath    Review of Systems  Weight stable No chest pain No active dental complaints gets her teeth cleaned twice yearly Followed by Dr. Radford Pax for bicuspid aortic valve with minimal stenosis, asymptomatic No edema No abdominal pain  BP 127/75 (BP Location: Left Arm, Patient Position: Sitting, Cuff  Size: Normal)   Pulse 75   Temp (!) 97.3 F (36.3 C) Comment: thermal  Resp 16   Ht 5' 2"  (1.575 m)   Wt 123 lb (55.8 kg)   SpO2 98% Comment: RA  BMI 22.50 kg/m  Physical Exam      Exam    General- alert and comfortable    Neck- no JVD, no cervical adenopathy palpable, no carotid bruit   Lungs- clear without rales, wheezes   Cor- regular rate and rhythm, 2/6 AS  murmur ,  - gallop   Abdomen- soft, non-tender   Extremities - warm, non-tender, minimal edema   Neuro- oriented, appropriate, no focal weakness   Diagnostic Tests: MRI images personally viewed and counseled with patient.  There is no evidence of further large liver ascending aorta which is remained stable for several years.  Impression: Best therapy is continued blood pressure control and monitoring.  She understands to avoid Levaquin which can weaken the connective tissue in the aortic wall. Continue current activities and meds.  She will need surveillance scan in about 18 months since that she had claustrophobia with MRA she will get a CTA. Plan: Return in 18 months for follow-up with scan for asymptomatic mild fusiform aneurysm.  Len Childs, MD [facility] [phone]

## 2018-12-22 ENCOUNTER — Telehealth: Payer: Self-pay | Admitting: Oncology

## 2018-12-22 NOTE — Telephone Encounter (Signed)
GBS PAL 10/22. Appointments moved from 10/22 to 11/2. Left message. Schedule mailed.

## 2018-12-26 ENCOUNTER — Other Ambulatory Visit: Payer: Medicare Other

## 2018-12-29 ENCOUNTER — Ambulatory Visit: Payer: Medicare Other | Admitting: Oncology

## 2018-12-29 ENCOUNTER — Other Ambulatory Visit: Payer: Self-pay | Admitting: Cardiology

## 2019-01-05 ENCOUNTER — Other Ambulatory Visit: Payer: Self-pay

## 2019-01-05 ENCOUNTER — Telehealth: Payer: Self-pay

## 2019-01-05 ENCOUNTER — Ambulatory Visit (HOSPITAL_COMMUNITY): Payer: Medicare Other | Attending: Cardiovascular Disease

## 2019-01-05 DIAGNOSIS — I350 Nonrheumatic aortic (valve) stenosis: Secondary | ICD-10-CM

## 2019-01-05 NOTE — Telephone Encounter (Signed)
Notes recorded by Frederik Schmidt, RN on 01/05/2019 at 12:57 PM EDT  The patient has been notified of the Echo result and verbalized understanding. All questions (if any) were answered.  Frederik Schmidt, RN 01/05/2019 12:56 PM   ------

## 2019-01-05 NOTE — Telephone Encounter (Signed)
-----   Message from Sueanne Margarita, MD sent at 01/05/2019 12:38 PM EDT ----- 2D echo showed normal LVF with increased stiffness of heart muscle, mildly leaky MV, TV, PV and AV. Minimally enlarged ascending aorta that is very stable.

## 2019-01-09 ENCOUNTER — Other Ambulatory Visit: Payer: Self-pay

## 2019-01-09 ENCOUNTER — Inpatient Hospital Stay: Payer: Medicare Other | Attending: Oncology

## 2019-01-09 ENCOUNTER — Inpatient Hospital Stay (HOSPITAL_BASED_OUTPATIENT_CLINIC_OR_DEPARTMENT_OTHER): Payer: Medicare Other | Admitting: Oncology

## 2019-01-09 VITALS — BP 173/76 | HR 69 | Temp 98.5°F | Resp 16 | Ht 62.0 in | Wt 124.0 lb

## 2019-01-09 DIAGNOSIS — R197 Diarrhea, unspecified: Secondary | ICD-10-CM | POA: Insufficient documentation

## 2019-01-09 DIAGNOSIS — Q231 Congenital insufficiency of aortic valve: Secondary | ICD-10-CM | POA: Insufficient documentation

## 2019-01-09 DIAGNOSIS — C7A019 Malignant carcinoid tumor of the small intestine, unspecified portion: Secondary | ICD-10-CM

## 2019-01-09 DIAGNOSIS — I712 Thoracic aortic aneurysm, without rupture: Secondary | ICD-10-CM | POA: Insufficient documentation

## 2019-01-09 NOTE — Progress Notes (Signed)
  Debra [name] OFFICE PROGRESS NOTE   Diagnosis: Carcinoid tumor INTERVAL HISTORY:   Debra [name] returns for a scheduled visit.  She feels well.  She reports having 0 to 4 days of diarrhea.  This pattern has not changed since she underwent a bowel resection in 2016.  No fever or flushing spells.  Good appetite.  Objective:  Vital signs in last 24 hours:  Blood pressure (!) 173/76, pulse 69, temperature 98.5 F (36.9 C), temperature source Temporal, resp. rate 16, height 5\' 2"  (1.575 m), weight 124 lb (56.2 kg), SpO2 100 %.    HEENT: Neck without mass Lymphatics: No cervical, supraclavicular, axillary, or inguinal nodes GI: No hepatosplenomegaly, nontender, no mass Vascular: No leg edema   Lab Results:  Lab Results  Component Value Date   WBC 7.4 05/13/2015   HGB 12.5 05/13/2015   HCT 38.3 05/13/2015   MCV 83.3 05/13/2015   PLT 221 05/13/2015   NEUTROABS 4.5 05/13/2015    CMP  Lab Results  Component Value Date   NA 142 04/07/2016   K 4.6 04/07/2016   CL 106 04/07/2016   CO2 31 04/07/2016   GLUCOSE 92 04/07/2016   BUN 18 04/07/2016   CREATININE 0.70 03/29/2018   CALCIUM 9.3 04/07/2016   PROT 5.9 (L) 01/03/2018   ALBUMIN 4.3 01/03/2018   AST 16 01/03/2018   ALT 10 01/03/2018   ALKPHOS 112 01/03/2018   BILITOT 1.4 (H) 01/03/2018   GFRNONAA >60 03/29/2018   GFRAA >60 03/29/2018     Medications: I have reviewed the patient's current medications.   Assessment/Plan: 1. Carcinoid tumors involving the the ileum, status post a small bowel resection 05/14/2014 with 1.4 cm and 1.1 cm tumors noted,T4Nx ? The larger tumor showed focal involvement of the serosal surface, resection margins negative, lymphovascular invasion present ? Persistent abdominal pain and nausea, CT showed evidence of a distal small bowel obstruction 10/03/2014 with an octreotide scan 10/17/2014 confirming positive uptake in a mesenteric lymph node ? Small bowel resection  11/15/2014 confirmed multiple small bowel neuroendocrine tumors, grade 2, with metastatic tumor involving 4 lymph nodes  2. History of intermittent muscle pain and nausea/vomiting secondary to bowel obstruction from #1  3. 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 Thoracic Aortic aneurysm  5. Bicuspid aortic valve   Disposition: Debra [name] remains in good remission from the carcinoid tumor.  We will follow up on from today.  She will return for an office visit and chromogranin a level in 1 year.  She continues follow-up with cardiology and thoracic surgery for evaluation of the thoracic aortic aneurysm and bicuspid aortic valve.  Debra Coder, MD  01/09/2019  12:50 PM

## 2019-01-10 ENCOUNTER — Telehealth: Payer: Self-pay | Admitting: Oncology

## 2019-01-10 LAB — CHROMOGRANIN A REBASELINE
Chromogranin A (ng/mL): 52.8 ng/mL (ref 0.0–101.8)
Chromogranin A: 1 nmol/L (ref 0–5)

## 2019-01-10 NOTE — Telephone Encounter (Signed)
Scheduled per los. Called, not able to leave msg. Mailed printout  

## 2019-01-11 ENCOUNTER — Telehealth: Payer: Self-pay | Admitting: *Deleted

## 2019-01-11 NOTE — Telephone Encounter (Signed)
-----   Message from Ladell Pier, MD sent at 01/10/2019  4:54 PM EST ----- Please call patient, chromogranin A is normal, value of 52.8 is with you assay-note reference range

## 2019-01-11 NOTE — Telephone Encounter (Signed)
Notified of chromogranin results and how the new value/reference range compares to prior testing. She understands and agrees.

## 2019-01-25 ENCOUNTER — Other Ambulatory Visit: Payer: Self-pay | Admitting: Cardiology

## 2019-02-20 NOTE — Progress Notes (Signed)
Virtual Visit via Telephone Note   This visit type was conducted due to national recommendations for restrictions regarding the COVID-19 Pandemic (e.g. social distancing) in an effort to limit this patient's exposure and mitigate transmission in our community.  Due to her co-morbid illnesses, this patient is at least at moderate risk for complications without adequate follow up.  This format is felt to be most appropriate for this patient at this time.  The patient did not have access to video technology/had technical difficulties with video requiring transitioning to audio format only (telephone).  All issues noted in this document were discussed and addressed.  No physical exam could be performed with this format.  Please refer to the patient's chart for her  consent to telehealth for [facility].   Evaluation Performed:  Follow-up visit  This visit type was conducted due to national recommendations for restrictions regarding the COVID-19 Pandemic (e.g. social distancing).  This format is felt to be most appropriate for this patient at this time.  All issues noted in this document were discussed and addressed.  No physical exam was performed (except for noted visual exam findings with Video Visits).  Please refer to the patient's chart (MyChart message for video visits and phone note for telephone visits) for the patient's consent to telehealth for [facility].  Date:  02/21/2019   ID:  Debra [name], Debra [name] 03/30/1950, MRN [id number]  Patient Location:  Home  Provider location:   [place]  PCP:  Martinique, Betty G, MD  Cardiologist:  Fransico Him, MD Electrophysiologist:  None   Chief Complaint:  BAV with mild AS, HTN, thoracic aortic aneursym  History of Present Illness:    Debra [name] is a 68 y.o. female who presents via audio/video conferencing for a telehealth visit today.    Debra [name] is a 68 y.o. female with a hx of thoracic aortic  aneurysm(64mm by Chest CT 2017)followed by Dr. Prescott Gum, nonobstructive ASCAD(cath 2012 with 40-50% ostial narrowing of the LAD otherwise normal), HTN, bicuspid AV with mildto moderateAS and dyslipidemia.   She is here today for followup and is doing well.  She denies any chest pain or pressure, SOB, DOE, PND, orthopnea, LE edema,  palpitations or syncope. Occasionally she will have some dizziness if she moves too fast. She has been having problems with her BP being elevated.  She is compliant with her meds and is tolerating meds with no SE.    The patient does not have symptoms concerning for COVID-19 infection (fever, chills, cough, or new shortness of breath).   Prior CV studies:   The following studies were reviewed today:  None  Past Medical History:  Diagnosis Date  . Aortic aneurysm ([place])   . Arthritis    "moderate in back" (11/15/2014)  . Bicuspid aortic valve    mild AS by echo 12/2016  . 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 Colon cancer ([place])   . Coronary artery disease    nonobstructive  . H/O echocardiogram    bicuspid aortic valve with moderate aortic valve sclerosis,mild PR.TR.MR and mildly dilated aorta by echo on 8.2012  . Heart murmur   . Hypertension   . Iron deficiency anemia    "can't tolerate the supplements right now" (11/15/2014)  . Kidney stones    "passed them"  . Mild mitral regurgitation   . Thoracic aortic aneurysm (HCC)    mild, 4.1 cm by recent CT followed by Dr Darcey Nora  Past Surgical History:  Procedure Laterality Date  . ABDOMINAL HYSTERECTOMY    . BOWEL RESECTION N/A 11/15/2014   Procedure: SMALL BOWEL RESECTION;  Surgeon: [name], MD;  Location: [place];  Service: General;  Laterality: N/A;  . CATARACT EXTRACTION W/ INTRAOCULAR LENS IMPLANT    . COLON SURGERY    . COLONOSCOPY  10/15/11   diverticulosis present, mild severity, no polyps, repeat in 10 years.  Marland Kitchen LAPAROSCOPIC LYSIS OF ADHESIONS N/A 05/14/2014    Procedure: LAPAROSCOPIC LYSIS OF ADHESIONS x 25mins;  Surgeon: [name], MD;  Location: [place];  Service: General;  Laterality: N/A;  . LAPAROSCOPIC SMALL BOWEL RESECTION N/A 05/14/2014   Procedure: LAPAROSCOPIC ASSISTED SMALL BOWEL RESECTION;  Surgeon: [name], MD;  Location: [place];  Service: General;  Laterality: N/A;  . SMALL INTESTINE SURGERY  11/15/2014  . TONSILLECTOMY       Current Meds  Medication Sig  . aspirin 81 MG tablet Take 81 mg by mouth daily.  . clindamycin (CLEOCIN) 150 MG capsule Take 600 mg by mouth once. Before dental visits  . metoprolol succinate (TOPROL-XL) 50 MG 24 hr tablet TAKE 1 TABLET BY MOUTH EVERY DAY  . simvastatin (ZOCOR) 20 MG tablet Take 1 tablet (20 mg total) by mouth daily at 6 PM.     Allergies:   Codeine, Opium, Oxycodone, and Penicillins   Social History   Tobacco Use  . Smoking status: Never Smoker  . Smokeless tobacco: Never Used  Substance Use Topics  . Alcohol use: Yes    Alcohol/week: 4.0 standard drinks    Types: 4 Glasses of wine per week  . Drug use: No     Family Hx: The patient's family history includes Breast cancer in her mother; CAD in her maternal grandfather, paternal grandfather, and paternal grandmother; Cancer in her father; Diabetes in her father; Heart attack in her paternal grandmother. There is no history of Stroke or Hypertension.  ROS:   Please see the history of present illness.     All other systems reviewed and are negative.   Labs/Other Tests and Data Reviewed:    Recent Labs: 03/29/2018: Creatinine, Ser 0.70   Recent Lipid Panel Lab Results  Component Value Date/Time   CHOL 170 01/03/2018 09:28 AM   CHOL 141 02/19/2014 07:40 AM   TRIG 89 01/03/2018 09:28 AM   TRIG 92 02/19/2014 07:40 AM   HDL 81 01/03/2018 09:28 AM   HDL 69 02/19/2014 07:40 AM   CHOLHDL 2.1 01/03/2018 09:28 AM   CHOLHDL 2 04/07/2016 09:12 AM   LDLCALC 71 01/03/2018 09:28 AM   LDLCALC 54 02/19/2014 07:40 AM    Wt  Readings from Last 3 Encounters:  02/21/19 122 lb (55.3 kg)  01/09/19 124 lb (56.2 kg)  09/07/18 123 lb (55.8 kg)     Objective:    Vital Signs:  BP (!) 169/95   Ht 5\' 2"  (1.575 m)   Wt 122 lb (55.3 kg)   BMI 22.31 kg/m    ASSESSMENT & PLAN:    1.  ASCAD -cath 2012 with 40-50% ostial narrowing of the LAD otherwise normal -she denies any anginal sx -continue ASA 81mg  daily, BB and statin  2.  HTN -BP has been elevated recently in the [id number] systolic -continue Toprol XL 50mg  daily -start Losartan 25mg  daily -check BP twice daily for a week and call with results -check BMET in 1 week  3.  Bicuspid AV -2D echo 12/2018 showed bicuspid AV with mild AS  with mean AVG 37mmHg -repeat echo 12/2019  4.  HLD -LDL goal < 70 -LDL 71 a year ago -repeat FLP and ALT -Continue simvastatin 20mg  daily  5. Ascending aortic aneurysm -10mm by Cardiac MRI 03/2018 -continue statin  COVID-19 Education: The signs and symptoms of COVID-19 were discussed with the patient and how to seek care for testing (follow up with PCP or arrange E-visit).  The importance of social distancing was discussed today.  Patient Risk:   After full review of this patient's clinical status, I feel that they are at least moderate risk at this time.  Time:   Today, I have spent 20 minutes directly with the patient on telemedicine discussing medical problems including CAD, HTN, BAV, HLD.  We also reviewed the symptoms of COVID 19 and the ways to protect against contracting the virus with telehealth technology.  I spent an additional 5 minutes reviewing patient's chart including labs, 2D echo.  Medication Adjustments/Labs and Tests Ordered: Current medicines are reviewed at length with the patient today.  Concerns regarding medicines are outlined above.  Tests Ordered: No orders of the defined types were placed in this encounter.  Medication Changes: No orders of the defined types were placed in this  encounter.   Disposition:  Follow up in 1 year(s)  Signed, Fransico Him, MD  02/21/2019 8:10 AM    [facility]

## 2019-02-21 ENCOUNTER — Other Ambulatory Visit: Payer: Self-pay

## 2019-02-21 ENCOUNTER — Encounter: Payer: Self-pay | Admitting: Cardiology

## 2019-02-21 ENCOUNTER — Telehealth (INDEPENDENT_AMBULATORY_CARE_PROVIDER_SITE_OTHER): Payer: Medicare Other | Admitting: Cardiology

## 2019-02-21 VITALS — BP 169/95 | Ht 62.0 in | Wt 122.0 lb

## 2019-02-21 DIAGNOSIS — Q231 Congenital insufficiency of aortic valve: Secondary | ICD-10-CM

## 2019-02-21 DIAGNOSIS — I2510 Atherosclerotic heart disease of native coronary artery without angina pectoris: Secondary | ICD-10-CM | POA: Diagnosis not present

## 2019-02-21 DIAGNOSIS — E7800 Pure hypercholesterolemia, unspecified: Secondary | ICD-10-CM | POA: Diagnosis not present

## 2019-02-21 DIAGNOSIS — I7121 Aneurysm of the ascending aorta, without rupture: Secondary | ICD-10-CM

## 2019-02-21 DIAGNOSIS — I10 Essential (primary) hypertension: Secondary | ICD-10-CM

## 2019-02-21 DIAGNOSIS — I712 Thoracic aortic aneurysm, without rupture: Secondary | ICD-10-CM

## 2019-02-21 MED ORDER — LOSARTAN POTASSIUM 25 MG PO TABS: 25.0000 mg | ORAL_TABLET | Freq: Every day | ORAL | 3 refills | Status: DC

## 2019-02-21 NOTE — Patient Instructions (Addendum)
Medication Instructions:  Your physician has recommended you make the following change in your medication:  1) START losartan (COZAAR) 25 mg daily  *If you need a refill on your cardiac medications before your next appointment, please call your pharmacy*  Lab Work: BMET, Fasting Lipids, and ALT  If you have labs (blood work) drawn today and your tests are completely normal, you will receive your results only by: Marland Kitchen MyChart Message (if you have MyChart) OR . A paper copy in the mail If you have any lab test that is abnormal or we need to change your treatment, we will call you to review the results.  Follow-Up: At [facility], you and your health needs are our priority.  As part of our continuing mission to provide you with exceptional heart care, we have created designated Provider Care Teams.  These Care Teams include your primary Cardiologist (physician) and Advanced Practice Providers (APPs -  Physician Assistants and Nurse Practitioners) who all work together to provide you with the care you need, when you need it.  Your next appointment:   6 month(s)  The format for your next appointment:   Either In Person or Virtual  Provider:   Melina Copa, PA-C or Ermalinda Barrios, PA-C   Follow up with Dr. Radford Pax in 1 year.   Other Instructions Take your blood pressure and heart rate twice daily for one week and send [place] your readings via MyChart.

## 2019-03-01 ENCOUNTER — Other Ambulatory Visit: Payer: Medicare Other | Admitting: *Deleted

## 2019-03-01 ENCOUNTER — Other Ambulatory Visit: Payer: Self-pay

## 2019-03-01 ENCOUNTER — Other Ambulatory Visit: Payer: Self-pay | Admitting: Cardiology

## 2019-03-01 DIAGNOSIS — I712 Thoracic aortic aneurysm, without rupture: Secondary | ICD-10-CM

## 2019-03-01 DIAGNOSIS — I7121 Aneurysm of the ascending aorta, without rupture: Secondary | ICD-10-CM

## 2019-03-01 DIAGNOSIS — Q231 Congenital insufficiency of aortic valve: Secondary | ICD-10-CM

## 2019-03-01 DIAGNOSIS — I2510 Atherosclerotic heart disease of native coronary artery without angina pectoris: Secondary | ICD-10-CM

## 2019-03-01 DIAGNOSIS — E7800 Pure hypercholesterolemia, unspecified: Secondary | ICD-10-CM

## 2019-03-01 DIAGNOSIS — I10 Essential (primary) hypertension: Secondary | ICD-10-CM

## 2019-03-02 LAB — BASIC METABOLIC PANEL
BUN/Creatinine Ratio: 20 (ref 12–28)
BUN: 13 mg/dL (ref 8–27)
CO2: 22 mmol/L (ref 20–29)
Calcium: 8.9 mg/dL (ref 8.7–10.3)
Chloride: 106 mmol/L (ref 96–106)
Creatinine, Ser: 0.66 mg/dL (ref 0.57–1.00)
GFR calc Af Amer: 104 mL/min/{1.73_m2} (ref >59)
GFR calc non Af Amer: 90 mL/min/{1.73_m2} (ref >59)
Glucose: 86 mg/dL (ref 65–99)
Potassium: 4.4 mmol/L (ref 3.5–5.2)
Sodium: 142 mmol/L (ref 134–144)

## 2019-03-02 LAB — LIPID PANEL
Chol/HDL Ratio: 2.2 ratio (ref 0.0–4.4)
Cholesterol, Total: 170 mg/dL (ref 100–199)
HDL: 78 mg/dL (ref >39)
LDL Chol Calc (NIH): 77 mg/dL (ref 0–99)
Triglycerides: 82 mg/dL (ref 0–149)
VLDL Cholesterol Cal: 15 mg/dL (ref 5–40)

## 2019-03-02 LAB — ALT: ALT: 14 IU/L (ref 0–32)

## 2019-04-04 ENCOUNTER — Ambulatory Visit: Payer: Medicare Other

## 2019-04-13 ENCOUNTER — Ambulatory Visit: Payer: Medicare Other

## 2019-04-21 ENCOUNTER — Ambulatory Visit: Payer: Medicare Other

## 2019-04-22 ENCOUNTER — Other Ambulatory Visit: Payer: Self-pay | Admitting: Cardiology

## 2019-07-25 ENCOUNTER — Other Ambulatory Visit: Payer: Self-pay | Admitting: Family Medicine

## 2019-07-25 DIAGNOSIS — Z1231 Encounter for screening mammogram for malignant neoplasm of breast: Secondary | ICD-10-CM

## 2019-09-20 ENCOUNTER — Other Ambulatory Visit: Payer: Self-pay

## 2019-09-20 ENCOUNTER — Ambulatory Visit
Admission: RE | Admit: 2019-09-20 | Discharge: 2019-09-20 | Disposition: A | Discharge status: Home / Self Care | Payer: Medicare PPO | Source: Ambulatory Visit | Attending: Family Medicine | Admitting: Family Medicine

## 2019-09-20 DIAGNOSIS — Z1231 Encounter for screening mammogram for malignant neoplasm of breast: Secondary | ICD-10-CM

## 2019-10-10 ENCOUNTER — Ambulatory Visit: Payer: Medicare PPO | Admitting: Family Medicine

## 2019-10-10 ENCOUNTER — Encounter: Payer: Self-pay | Admitting: Family Medicine

## 2019-10-10 ENCOUNTER — Ambulatory Visit (INDEPENDENT_AMBULATORY_CARE_PROVIDER_SITE_OTHER)
Admission: RE | Admit: 2019-10-10 | Discharge: 2019-10-10 | Disposition: A | Discharge status: Home / Self Care | Payer: Medicare PPO | Source: Ambulatory Visit | Attending: Family Medicine | Admitting: Family Medicine

## 2019-10-10 ENCOUNTER — Other Ambulatory Visit: Payer: Self-pay

## 2019-10-10 VITALS — BP 120/70 | HR 76 | Resp 16 | Ht 62.0 in | Wt 122.0 lb

## 2019-10-10 DIAGNOSIS — M545 Low back pain, unspecified: Secondary | ICD-10-CM

## 2019-10-10 DIAGNOSIS — I712 Thoracic aortic aneurysm, without rupture, unspecified: Secondary | ICD-10-CM

## 2019-10-10 DIAGNOSIS — M79605 Pain in left leg: Secondary | ICD-10-CM | POA: Diagnosis not present

## 2019-10-10 DIAGNOSIS — G8929 Other chronic pain: Secondary | ICD-10-CM | POA: Diagnosis not present

## 2019-10-10 DIAGNOSIS — M1612 Unilateral primary osteoarthritis, left hip: Secondary | ICD-10-CM | POA: Diagnosis not present

## 2019-10-10 DIAGNOSIS — R1032 Left lower quadrant pain: Secondary | ICD-10-CM | POA: Diagnosis not present

## 2019-10-10 DIAGNOSIS — C7A019 Malignant carcinoid tumor of the small intestine, unspecified portion: Secondary | ICD-10-CM

## 2019-10-10 DIAGNOSIS — M47816 Spondylosis without myelopathy or radiculopathy, lumbar region: Secondary | ICD-10-CM | POA: Diagnosis not present

## 2019-10-10 MED ORDER — DIAZEPAM 5 MG PO TABS: ORAL_TABLET | ORAL | 0 refills | Status: AC

## 2019-10-10 NOTE — Patient Instructions (Signed)
A few things to remember from today's visit:   Continue Tylenol 500 mg 3-4 tabs if needed. Monitor for new symptoms. Lumbar MRI will be arranged.  If you need refills please call your pharmacy. Do not use My Chart to request refills or for acute issues that need immediate attention.   Please be sure medication list is accurate. If a new problem present, please set up appointment sooner than planned today.

## 2019-10-10 NOTE — Progress Notes (Signed)
Chief Complaint  Patient presents with  . back & hip pain   HPI: Debra [name] is a 69 y.o. female, who is here today with above complaints. Years Hx of lower back pain, she was dx'ed with DDD. It is becoming more frequent and intense for the past couple months. No recent trauma or unusual activity. 2-6/10 depending of activities. Negative for saddle anesthesia, LE weakness, or bowel/bladder dysfunction. She has taking Tylenol and occasionally ibuprofen.  Left groin pain 6-8/10, "intense" pain, tight feeling, no radiated. Pain has improved.  She has not noted skin change or mass.  + Lateral hip pain radiated down to distal LE for over 6 months,intermittently. Sharp pain,occasionally tingling sensation under knee. Exacerbated by certain activities like going up a ladder. Pain does not affect daily activities. It is better in the morning.  Since her last visit she has followed with oncologist, Dr. Learta Codding, history of carcinoid tumor involving the ileum, status post small bowel resection in 05/2014. She has had intermittent episodes of diarrhea about 4 times per month since colectomy.  Coronary artery atherosclerosis and bicuspid aortic valve, she follows with cardiologist, Dr. Radford Pax.  Negative for CP,SOB,palpitations,or dyspnea.  Ascending aortic aneurysm: She is following with Dr. Prescott Gum annually, last seen 09/2018. Last chest  MRA on 03/29/18:  Stable aneurysmal dilatation of the ascending aorta at 4.1 cm.  Review of Systems  Constitutional: Negative for appetite change, chills and fever.  Respiratory: Negative for cough and wheezing.   Cardiovascular: Negative for leg swelling.  Gastrointestinal: Negative for abdominal pain and vomiting.       No changes in bowel movement.  Genitourinary: Negative for decreased urine volume, dysuria and hematuria.  Skin: Negative for pallor and rash.  Neurological: Negative for weakness and headaches.  Rest see  pertinent positives and negatives per HPI.  Current Outpatient Medications on File Prior to Visit  Medication Sig Dispense Refill  . aspirin 81 MG tablet Take 81 mg by mouth daily.    . clindamycin (CLEOCIN) 150 MG capsule Take 600 mg by mouth once. Before dental visits    . losartan (COZAAR) 25 MG tablet Take 1 tablet (25 mg total) by mouth daily. 90 tablet 3  . metoprolol succinate (TOPROL-XL) 50 MG 24 hr tablet TAKE 1 TABLET BY MOUTH EVERY DAY 90 tablet 3  . simvastatin (ZOCOR) 20 MG tablet TAKE 1 TABLET (20 MG TOTAL) BY MOUTH DAILY AT 6 PM. 90 tablet 3   No current facility-administered medications on file prior to visit.   Past Medical History:  Diagnosis Date  . Aortic aneurysm ([place])   . Arthritis    "moderate in back" (11/15/2014)  . Bicuspid aortic valve    mild AS by echo 12/2016  . 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 Colon cancer ([place])   . Coronary artery disease    nonobstructive  . H/O echocardiogram    bicuspid aortic valve with moderate aortic valve sclerosis,mild PR.TR.MR and mildly dilated aorta by echo on 8.2012  . Heart murmur   . Hypertension   . Iron deficiency anemia    "can't tolerate the supplements right now" (11/15/2014)  . Kidney stones    "passed them"  . Mild mitral regurgitation   . Thoracic aortic aneurysm ([facility])    mild, 4.1 cm by recent CT followed by Dr Darcey Nora   Allergies  Allergen Reactions  . Codeine Anaphylaxis and Shortness Of Breath  . Opium Anaphylaxis  . Oxycodone  Shortness Of Breath  . Penicillins Anaphylaxis and Shortness Of Breath    Social History   Socioeconomic History  . Marital status: Married    Spouse name: Not on file  . Number of children: Not on file  . Years of education: Not on file  . Highest education level: Not on file  Occupational History  . Not on file  Tobacco Use  . Smoking status: Never Smoker  . Smokeless tobacco: Never Used  Vaping Use  . Vaping Use: Never used   Substance and Sexual Activity  . Alcohol use: Yes    Alcohol/week: 4.0 standard drinks    Types: 4 Glasses of wine per week  . Drug use: No  . Sexual activity: Yes  Other Topics Concern  . Not on file  Social History Narrative   Married, spouse Debra [name]   Social Determinants of Health   Financial Resource Strain:   . Difficulty of Paying Living Expenses:   Food Insecurity:   . Worried About Charity fundraiser in the Last Year:   . Arboriculturist in the Last Year:   Transportation Needs:   . Film/video editor (Medical):   Marland Kitchen Lack of Transportation (Non-Medical):   Physical Activity:   . Days of Exercise per Week:   . Minutes of Exercise per Session:   Stress:   . Feeling of Stress :   Social Connections:   . Frequency of Communication with Friends and Family:   . Frequency of Social Gatherings with Friends and Family:   . Attends Religious Services:   . Active Member of Clubs or Organizations:   . Attends Archivist Meetings:   Marland Kitchen Marital Status:    Vitals:   10/10/19 1024  BP: 120/70  Pulse: 76  Resp: 16  SpO2: 99%   Body mass index is 22.31 kg/m.  Physical Exam Vitals and nursing note reviewed.  Constitutional:      General: She is not in acute distress.    Appearance: She is well-developed and normal weight. She is not ill-appearing.  HENT:     Head: Normocephalic and atraumatic.  Eyes:     Conjunctiva/sclera: Conjunctivae normal.  Cardiovascular:     Rate and Rhythm: Normal rate and regular rhythm.     Heart sounds: Murmur (I/VI SEM LUSB) heard.   Pulmonary:     Effort: Pulmonary effort is normal. No respiratory distress.     Breath sounds: Normal breath sounds.  Abdominal:     Palpations: Abdomen is soft. There is no hepatomegaly or mass.     Tenderness: There is no abdominal tenderness.     Comments: I do not appreciate hernia defect left inguinal.  Musculoskeletal:     Lumbar back: No spasms, tenderness or bony tenderness.      Left hip: Decreased range of motion (Minimal. Elicits groin pain).     Comments: No significant deformity appreciated. + Hyperlordosis. Pain is not elicited with movement on exam table during examination. No local edema or erythema appreciated, no suspicious lesions.  Lymphadenopathy:     Upper Body:     Right upper body: No supraclavicular adenopathy.     Left upper body: No supraclavicular adenopathy.  Skin:    General: Skin is warm.     Findings: No erythema or rash.  Neurological:     General: No focal deficit present.     Mental Status: She is alert and oriented to person, place, and time.  Gait: Gait normal.     Comments: SLR negative bilateral.  Psychiatric:     Comments: Well groomed, good eye contact.   ASSESSMENT AND PLAN:  Ms. Magda was seen today for back & hip pain.  Diagnoses and all orders for this visit:  Orders Placed This Encounter  Procedures  . MR Lumbar Spine Wo Contrast  . DG Hip Unilat W OR W/O Pelvis 2-3 Views Left    Pain of left lower extremity ? Radiculopathy. Because it has been going on for > 6 months and getting worse, I think lumbar MRI is warrant. Instructed about warning signs.  Left inguinal pain We discussed possible etiologies. OA and muscle strain to be considered. It is improving.  Chronic bilateral low back pain, unspecified whether sciatica present Getting worse. For pain management she can take Tylenol 500 mg 3 to 4 tablets daily as needed. Recommend trying to avoid NSAIDs. Further recommendation will be given according to lumbar MRI results.  Thoracic aortic aneurysm without rupture ([place]) Problem has been stable. Following with cardiothoracic surgeon.  Malignant carcinoid tumor of small intestine, unspecified location [facility]) Following with oncologist annually.  Other orders -     diazepam (VALIUM) 5 MG tablet; 1-2 tab 15 min before procedure.   Return if symptoms worsen or fail to improve.   Jezabella Schriever G.  Martinique, MD  [facility]. [place] office.  Discharge Instructions       A few things to remember from today's visit:   Continue Tylenol 500 mg 3-4 tabs if needed. Monitor for new symptoms. Lumbar MRI will be arranged.  If you need refills please call your pharmacy. Do not use My Chart to request refills or for acute issues that need immediate attention.   Please be sure medication list is accurate. If a new problem present, please set up appointment sooner than planned today.

## 2019-10-13 ENCOUNTER — Other Ambulatory Visit: Payer: Self-pay

## 2019-10-13 DIAGNOSIS — R1032 Left lower quadrant pain: Secondary | ICD-10-CM

## 2019-10-19 ENCOUNTER — Ambulatory Visit
Admission: RE | Admit: 2019-10-19 | Discharge: 2019-10-19 | Disposition: A | Discharge status: Home / Self Care | Payer: Medicare PPO | Source: Ambulatory Visit | Attending: Family Medicine | Admitting: Family Medicine

## 2019-10-19 DIAGNOSIS — M48061 Spinal stenosis, lumbar region without neurogenic claudication: Secondary | ICD-10-CM | POA: Diagnosis not present

## 2019-10-19 DIAGNOSIS — M79605 Pain in left leg: Secondary | ICD-10-CM

## 2019-10-19 DIAGNOSIS — G8929 Other chronic pain: Secondary | ICD-10-CM

## 2019-10-19 DIAGNOSIS — M545 Low back pain, unspecified: Secondary | ICD-10-CM

## 2019-10-30 ENCOUNTER — Ambulatory Visit: Payer: Self-pay

## 2019-10-30 ENCOUNTER — Ambulatory Visit: Payer: Medicare PPO | Admitting: Orthopaedic Surgery

## 2019-10-30 DIAGNOSIS — M5442 Lumbago with sciatica, left side: Secondary | ICD-10-CM

## 2019-10-30 DIAGNOSIS — G8929 Other chronic pain: Secondary | ICD-10-CM

## 2019-10-30 DIAGNOSIS — M25552 Pain in left hip: Secondary | ICD-10-CM

## 2019-10-30 DIAGNOSIS — M1612 Unilateral primary osteoarthritis, left hip: Secondary | ICD-10-CM | POA: Diagnosis not present

## 2019-10-30 NOTE — Progress Notes (Signed)
Office Visit Note   Patient: Debra [name]           Date of Birth: 05/15/1950           MRN: [id number]   Visit Date: 10/30/2019              Requested by: Martinique, Betty G, MD [address] [place],  [place] [address] PCP: Martinique, Betty G, MD   Assessment & Plan: Visit Diagnoses:  1. Pain in left hip   2. Chronic left-sided low back pain with left-sided sciatica   3. Unilateral primary osteoarthritis, left hip     Plan: We had a long and thorough discussion about her back and her hip.  Her hip seems to more the issue and I would recommend a total hip arthroplasty at this standpoint.  We had a long thorough discussion about her interoperative and postoperative course and what to expect with surgery.  I talked about the risk and benefits of surgery.  This certainly could help her back in terms of just a posture standpoint and pain.  All questions and concerns were answered addressed.  Work on getting the scheduled in the near future for a left total hip arthroplasty  Follow-Up Instructions: Return for 2 weeks post-op.   Orders:  No orders of the defined types were placed in this encounter.  No orders of the defined types were placed in this encounter.     Procedures: No procedures performed   Clinical Data: No additional findings.   Subjective: Chief Complaint  Patient presents with  . Left Leg - Pain  . Left Hip - Pain  The patient comes in today with 2 different issues.  She is mainly here for her left hip and she has developed significant groin pain that is detriment affecting her mobility, her actives daily living and her quality of life for over a year now.  She does participate in tai chi.  She is very active at 69 years old.  She also has low back pain that has been chronic.  It does radiate down her left side.  She has a MRI on the canopy system and plain films as well.  She does have a history of a bicuspid aortic valve and ascending aortic  aneurysm which has been deemed stable.  She has never had any issues with those situations.  She is on high blood pressure medication but not any blood thinning medication.  She is not a smoker and not a diabetic.  At this point her left hip pain has become more of a constant pain.  Her husband is with her who I have performed hip replacement surgery on in the past.  She also has a chronic cancer diagnosis and she is seen regularly by Dr. Benay Spice for a carcinoid tumor.  HPI  Review of Systems She currently denies any headache, chest pain, shortness of breath, fever, chills, nausea, vomiting  Objective: Vital Signs: There were no vitals taken for this visit.  Physical Exam She is alert and oriented x3 and in no acute distress Ortho Exam Examination of her left hip shows pain at extremes of motion and significant stiffness with internal and external rotation.  Her right hip exam is normal.  She has 5 out of 5 strength of her bilateral lower extremities with normal reflexes and normal sensation in all dermatomes. Specialty Comments:  No specialty comments available.  Imaging: No results found. An AP pelvis and lateral left hip does  show significant and severe arthritis.  There is particular osteophytes as well as significant joint space narrowing.  There is cystic changes in the femoral head with a component of osteonecrosis.  The MRI of her lumbar spine shows grade 1 spondylolisthesis at L4-L5 but multifactorial moderate to severe central and foraminal stenosis.  PMFS History: Patient Active Problem List   Diagnosis Date Noted  . Unilateral primary osteoarthritis, left hip 10/30/2019  . Bicuspid aortic valve   . SBO (small bowel obstruction) ([place]) 06/19/2014  . S/P small bowel resection 05/14/2014  . Malignant carcinoid tumor of small intestine ([place]) 05/14/2014  . Coronary atherosclerosis of native coronary artery 09/25/2013  . Pure hypercholesterolemia 09/25/2013  . Essential  hypertension, benign 09/25/2013  . Small bowel mass 08/01/2013  . Abdominal pain 08/01/2013  . Ascending aortic aneurysm ([place]) 08/01/2013   Past Medical History:  Diagnosis Date  . Aortic aneurysm ([place])   . Arthritis    "moderate in back" (11/15/2014)  . Bicuspid aortic valve    mild AS by echo 12/2016  . 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 Colon cancer ([place])   . Coronary artery disease    nonobstructive  . H/O echocardiogram    bicuspid aortic valve with moderate aortic valve sclerosis,mild PR.TR.MR and mildly dilated aorta by echo on 8.2012  . Heart murmur   . Hypertension   . Iron deficiency anemia    "can't tolerate the supplements right now" (11/15/2014)  . Kidney stones    "passed them"  . Mild mitral regurgitation   . Thoracic aortic aneurysm ([facility])    mild, 4.1 cm by recent CT followed by Dr Darcey Nora    Family History  Problem Relation Age of Onset  . CAD Maternal Grandfather   . CAD Paternal Grandmother   . Heart attack Paternal Grandmother   . CAD Paternal Grandfather   . Breast cancer Mother   . Cancer Father   . Diabetes Father   . Stroke Neg Hx   . Hypertension Neg Hx     Past Surgical History:  Procedure Laterality Date  . ABDOMINAL HYSTERECTOMY    . BOWEL RESECTION N/A 11/15/2014   Procedure: SMALL BOWEL RESECTION;  Surgeon: [name], MD;  Location: [facility];  Service: General;  Laterality: N/A;  . CATARACT EXTRACTION W/ INTRAOCULAR LENS IMPLANT    . COLON SURGERY    . COLONOSCOPY  10/15/11   diverticulosis present, mild severity, no polyps, repeat in 10 years.  Marland Kitchen LAPAROSCOPIC LYSIS OF ADHESIONS N/A 05/14/2014   Procedure: LAPAROSCOPIC LYSIS OF ADHESIONS x 51mns;  Surgeon: BGeorganna Skeans MD;  [place] [facility]  Service: General;  Laterality: N/A;  . LAPAROSCOPIC SMALL BOWEL RESECTION N/A 05/14/2014   Procedure: LAPAROSCOPIC ASSISTED SMALL BOWEL RESECTION;  Surgeon: BGeorganna Skeans MD;  [facility] [facility]  Service: General;   Laterality: N/A;  . SMALL INTESTINE SURGERY  11/15/2014  . TONSILLECTOMY     Social History   Occupational History  . Not on file  Tobacco Use  . Smoking status: Never Smoker  . Smokeless tobacco: Never Used  Vaping Use  . Vaping Use: Never used  Substance and Sexual Activity  . Alcohol use: Yes    Alcohol/week: 4.0 standard drinks    Types: 4 Glasses of wine per week  . Drug use: No  . Sexual activity: Yes

## 2019-11-20 ENCOUNTER — Telehealth: Payer: Self-pay | Admitting: Cardiology

## 2019-11-20 NOTE — Telephone Encounter (Signed)
Encounter not needed

## 2019-11-23 ENCOUNTER — Telehealth: Payer: Self-pay | Admitting: *Deleted

## 2019-11-23 NOTE — Telephone Encounter (Signed)
   Primary Cardiologist:Traci Turner, MD  Chart reviewed as part of pre-operative protocol coverage. Because of Debra [name] past medical history and time since last visit, they will require a follow-up visit in order to better assess preoperative cardiovascular risk.  She has no known obstructive CAD, therefore do not anticipate any issues with holding aspirin prior to her surgery.  Pre-op covering staff: - Please schedule appointment and call patient to inform them. - Please contact requesting surgeon's office via preferred method (i.e, phone, fax) to inform them of need for appointment prior to surgery.   Abigail Butts, PA-C  11/23/2019, 1:50 PM

## 2019-11-23 NOTE — Telephone Encounter (Signed)
   [facility] Pre-operative Risk Assessment    HEARTCARE STAFF: - Please ensure there is not already an duplicate clearance open for this procedure. - Under Visit Info/Reason for Call, type in Other and utilize the format Clearance MM/DD/YY or Clearance TBD. Do not use dashes or single digits. - If request is for dental extraction, please clarify the # of teeth to be extracted.  Request for surgical clearance:  1. What type of surgery is being performed?  LEFT TOTAL HIP ARTHROPLASTY   2. When is this surgery scheduled?  TBD   3. What type of clearance is required (medical clearance vs. Pharmacy clearance to hold med vs. Both)?  BOTH  4. Are there any medications that need to be held prior to surgery and how long? ASPIRIN   5. Practice name and name of physician performing surgery?  [facility]    6. What is the office phone number?  [phone]   7.   What is the office fax number?  [phone]  8.   Anesthesia type (None, local, MAC, general) ?  GERNERAL VS. SPINAL   Jeanann Lewandowsky 11/23/2019, 1:23 PM  _________________________________________________________________   (provider comments below)

## 2019-11-23 NOTE — Telephone Encounter (Signed)
Pt has been scheduled to see Kathyrn Drown 11/30/19 and clearance will be addressed at that time. Will route back to the requesting surgeon's office to make them aware.

## 2019-11-24 ENCOUNTER — Encounter: Payer: Self-pay | Admitting: *Deleted

## 2019-11-26 NOTE — Progress Notes (Signed)
Cardiology Office Note   Date:  11/30/2019   ID:  Debra [name], [place] [birth year], MRN [id number]  PCP:  Debra, Betty G, MD  Cardiologist:  Dr. Radford Pax  Chief Complaint  Patient presents with  . Pre-op Exam   History of Present Illness: Debra [name] is a 69 y.o. female who presents for preoperative clearance for left total hip arthroplasty, seen for Dr. Radford Pax.  Ms. Brummell has a history of thoracic aortic aneurysm measuring 41 mm by chest CT in 2017 followed by Dr. Darcey [name], nonobstructive CAD per cardiac cath 2012 with a 40 to 50% ostial narrowing of the LAD and otherwise normal coronary arteries, HTN, and bicuspid AV with mild to moderate aortic stenosis and dyslipidemia.  She was most recently seen by Dr. Radford Pax via telemedicine follow-up to 02/21/2019 at which time she was doing well from a CV standpoint with no chest pain, shortness of breath, DOE or PND.  She would occasionally note some dizziness with fast change of position along with BP elevations therefore losartan 25 mg p.o. daily was initiated.  She was continued on ASA 81, beta-blocker and statin.  In reference to her bicuspid AV plan was to repeat echocardiogram 12/2019.  Cardiology has received preoperative clearance for a left total hip arthroplasty and given known CAD and the above issues patient needed cardiac evaluation prior to proceeding with surgical intervention.  Today, she reports she is doing very well from a CV standpoint. She  does not have any unstable cardiac conditions. She can achieve 4 METs or greater without anginal symptoms.  According to [facility] and AHA guidelines, she requires no further cardiac workup prior to her noncardiac surgery and should be at acceptable risk. We discussed obtaining an echocardiogram however if unable to schdule prior to surgery she may still proceed with procedure and we will obtain in one months. She has no symptoms to indicate worsening of her AV  valve. She may also hold ASA prior to surgery if needed.   Past Medical History:  Diagnosis Date  . Aortic aneurysm ([place])   . Arthritis    "moderate in back" (11/15/2014)  . Bicuspid aortic valve    mild AS by echo 12/2016  . 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 Colon cancer ([place])    NEUROENDOCRINE   . Coronary artery disease    nonobstructive  . H/O echocardiogram    bicuspid aortic valve with moderate aortic valve sclerosis,mild PR.TR.MR and mildly dilated aorta by echo on 8.2012  . Heart murmur   . History of kidney stones   . Hypertension   . Iron deficiency anemia    "can't tolerate the supplements right now" (11/15/2014)  . Mild mitral regurgitation   . PONV (postoperative nausea and vomiting)    PREOP YEARS AGO WITH HYSTERECTOMY FELT " CREEPY" PRIOR TO SURGERY   . Thoracic aortic aneurysm ([facility])    mild, 4.1 cm by recent CT followed by Dr Debra [name]    Past Surgical History:  Procedure Laterality Date  . ABDOMINAL HYSTERECTOMY    . BOWEL RESECTION N/A 11/15/2014   Procedure: SMALL BOWEL RESECTION;  Surgeon: [name], MD;  Location: [facility];  Service: General;  Laterality: N/A;  . CATARACT EXTRACTION W/ INTRAOCULAR LENS IMPLANT    . COLON SURGERY    . COLONOSCOPY  10/15/11   diverticulosis present, mild severity, no polyps, repeat in 10 years.  Debra [name] LAPAROSCOPIC LYSIS OF ADHESIONS N/A 05/14/2014   Procedure:  LAPAROSCOPIC LYSIS OF ADHESIONS x 1mins;  Surgeon: [name], MD;  Location: [facility];  Service: General;  Laterality: N/A;  . LAPAROSCOPIC SMALL BOWEL RESECTION N/A 05/14/2014   Procedure: LAPAROSCOPIC ASSISTED SMALL BOWEL RESECTION;  Surgeon: [name], MD;  Location: [facility];  Service: General;  Laterality: N/A;  . SMALL INTESTINE SURGERY  11/15/2014  . TONSILLECTOMY       Current Outpatient Medications  Medication Sig Dispense Refill  . aspirin 81 MG tablet Take 81 mg by mouth daily.    . clindamycin (CLEOCIN) 150 MG capsule Take 600  mg by mouth See admin instructions. Take 600 mg by mouth prior to dental appointments    . losartan (COZAAR) 25 MG tablet Take 1 tablet (25 mg total) by mouth daily. 90 tablet 3  . metoprolol succinate (TOPROL-XL) 50 MG 24 hr tablet TAKE 1 TABLET BY MOUTH EVERY DAY 90 tablet 3  . simvastatin (ZOCOR) 20 MG tablet TAKE 1 TABLET (20 MG TOTAL) BY MOUTH DAILY AT 6 PM. 90 tablet 3   No current facility-administered medications for this visit.    Allergies:   Codeine, Opium, Oxycodone, and Penicillins    Social History:  The patient  reports that she has never smoked. She has never used smokeless tobacco. She reports current alcohol use of about 4.0 standard drinks of alcohol per week. She reports that she does not use drugs.   Family History:  The patient's family history includes Breast cancer in her mother; CAD in her maternal grandfather, paternal grandfather, and paternal grandmother; Cancer in her father; Diabetes in her father; Heart attack in her paternal grandmother.    ROS:  Please see the history of present illness. Otherwise, review of systems are positive for none.   All other systems are reviewed and negative.    PHYSICAL EXAM: VS:  BP 140/70   Pulse 73   Ht 5\' 2"  (1.575 m)   Wt 123 lb 12.8 oz (56.2 kg)   SpO2 98%   BMI 22.64 kg/m  , BMI Body mass index is 22.64 kg/m.   General: Well developed, well nourished, NAD Lungs:Clear to ausculation bilaterally. No wheezes, rales, or rhonchi. Breathing is unlabored. Cardiovascular: RRR with S1 S2. No murmurs, rubs, gallops, or LV heave appreciated. Extremities: No edema.  Neuro: Alert and oriented. No focal deficits. No facial asymmetry. MAE spontaneously. Psych: Responds to questions appropriately with normal affect.     EKG:  EKG is not ordered today.   Recent Labs: 03/01/2019: ALT 14 11/29/2019: BUN 15; Creatinine, Ser 0.51; Hemoglobin 12.3; Platelets 199; Potassium 4.4; Sodium 142    Lipid Panel    Component Value  Date/Time   CHOL 170 03/01/2019 1100   CHOL 141 02/19/2014 0740   TRIG 82 03/01/2019 1100   TRIG 92 02/19/2014 0740   HDL 78 03/01/2019 1100   HDL 69 02/19/2014 0740   CHOLHDL 2.2 03/01/2019 1100   CHOLHDL 2 04/07/2016 0912   VLDL 19.8 04/07/2016 0912   LDLCALC 77 03/01/2019 1100   LDLCALC 54 02/19/2014 0740     Wt Readings from Last 3 Encounters:  11/30/19 123 lb 12.8 oz (56.2 kg)  10/10/19 122 lb (55.3 kg)  02/21/19 122 lb (55.3 kg)    Other studies Reviewed: Additional studies/ records that were reviewed today include: see below  Review of the above records demonstrates:   Echocardiogram 01/05/2019:  1. Left ventricular ejection fraction, by visual estimation, is 55 to  60%. The left ventricle has normal function.  There is no left ventricular  hypertrophy.  2. Left ventricular diastolic parameters are consistent with Grade II  diastolic dysfunction (pseudonormalization).  3. Global right ventricle has normal systolic function.The right  ventricular size is normal. No increase in right ventricular wall  thickness.  4. Left atrial size was normal.  5. Right atrial size was normal.  6. The mitral valve is normal in structure. Mild mitral valve  regurgitation.  7. The tricuspid valve is normal in structure. Tricuspid valve  regurgitation is trivial.  8. The aortic valve is bicuspid. Aortic valve regurgitation is trivial.  9. The pulmonic valve was grossly normal. Pulmonic valve regurgitation is  trivial.  10. Aortic dilatation noted.  11. There is mild dilatation of the ascending aorta measuring 37 mm.  12. Mildly elevated pulmonary artery systolic pressure.  13. The atrial septum is grossly normal.   ASSESSMENT AND PLAN:  1.  Mild, non-obstructive CAD: -Last LHC 2012 with 40 to 50% ostial narrowing of the LAD and otherwise normal coronary arteries -No recent anginal symptoms -Continue ASA, beta-blocker and statin -No history of PCI -May hold ASA 3-5  days prior to surgery if needed.   2.   HTN: -Losartan 25 added to regimen at last follow-up given elevated BPs -Stable, 140/70 -Continue Toprol 50, losartan 25  3.  Bicuspid AV: -Last echocardiogram 12/2018 with bicuspid AV with mild aortic stenosis with a mean gradient at 15 mmHg with plans to repeat echocardiogram 12/2019 therefore will order study prior to hip repair -Denies shortness of breath, dyspnea -Obtain echo   4.  Preoperative cardiac clearance: -The patient does not have any unstable cardiac conditions.  Upon evaluation today, she can achieve 4 METs or greater without anginal symptoms. According to [facility] and AHA guidelines, she requires no further cardiac workup prior to surgery and should be at acceptable risk. Her Revised Cardiac risk of peri-procedural MI or cardiac arrest following surgery is low. -Obtain echocardiogram given known bicuspid AV    Current medicines are reviewed at length with the patient today.  The patient does not have concerns regarding medicines.  The following changes have been made:  no change  Labs/ tests ordered today include: echocardiogram   Orders Placed This Encounter  Procedures  . ECHOCARDIOGRAM COMPLETE   Disposition:   FU with Dr. Radford Pax in 1 year  Signed, Kathyrn Drown, NP  11/30/2019 4:28 PM    [address], [place], [place]  [address] Phone: [phone]; Fax: [phone]

## 2019-11-28 NOTE — Progress Notes (Signed)
DUE TO COVID-19 ONLY ONE VISITOR IS ALLOWED TO COME WITH YOU AND STAY IN THE WAITING ROOM ONLY DURING PRE OP AND PROCEDURE DAY OF SURGERY. THE 1 VISITOR  MAY VISIT WITH YOU AFTER SURGERY IN YOUR PRIVATE ROOM DURING VISITING HOURS ONLY!  YOU NEED TO HAVE A COVID 19 TEST ON_______ @_______ , THIS TEST MUST BE DONE BEFORE SURGERY,  COVID TESTING SITE [address] [place] [address], IT IS ON THE RIGHT GOING OUT [address] APPROXIMATELY  2 MINUTES PAST [employer] ON THE RIGHT. ONCE YOUR COVID TEST IS COMPLETED,  PLEASE BEGIN THE QUARANTINE INSTRUCTIONS AS OUTLINED IN YOUR HANDOUT.                Debra [name]  11/28/2019   Your procedure is scheduled on:    Report to [facility] Main  Entrance   Report to admitting at AM     Call this number if you have problems the morning of surgery [phone]    Remember: Do not eat food , candy gum or mints :After Midnight. You may have clear liquids from midnight until     CLEAR LIQUID DIET   Foods Allowed                                                                       Coffee and tea, regular and decaf                              Plain Jell-O any favor except red or purple                                            Fruit ices (not with fruit pulp)                                      Iced Popsicles                                     Carbonated beverages, regular and diet                                    Cranberry, grape and apple juices Sports drinks like Gatorade Lightly seasoned clear broth or consume(fat free) Sugar, honey syrup   _____________________________________________________________________    BRUSH YOUR TEETH MORNING OF SURGERY AND RINSE YOUR MOUTH OUT, NO CHEWING GUM CANDY OR MINTS.     Take these medicines the morning of surgery with A SIP OF WATER:   DO NOT TAKE ANY DIABETIC MEDICATIONS DAY OF YOUR SURGERY                               You may not have any metal  on your body including hair pins and  piercings  Do not wear jewelry, make-up, lotions, powders or perfumes, deodorant             Do not wear nail polish on your fingernails.  Do not shave  48 hours prior to surgery.              Men may shave face and neck.   Do not bring valuables to the hospital. [place].  Contacts, dentures or bridgework may not be worn into surgery.  Leave suitcase in the car. After surgery it may be brought to your room.     Patients discharged the day of surgery will not be allowed to drive home. IF YOU ARE HAVING SURGERY AND GOING HOME THE SAME DAY, YOU MUST HAVE AN ADULT TO DRIVE YOU HOME AND BE WITH YOU FOR 24 HOURS. YOU MAY GO HOME BY TAXI OR UBER OR ORTHERWISE, BUT AN ADULT MUST ACCOMPANY YOU HOME AND STAY WITH YOU FOR 24 HOURS.  Name and phone number of your driver:  Special Instructions: N/A              Please read over the following fact sheets you were given: _____________________________________________________________________  [facility] - Preparing for Surgery Before surgery, you can play an important role.  Because skin is not sterile, your skin needs to be as free of germs as possible.  You can reduce the number of germs on your skin by washing with CHG (chlorahexidine gluconate) soap before surgery.  CHG is an antiseptic cleaner which kills germs and bonds with the skin to continue killing germs even after washing. Please DO NOT use if you have an allergy to CHG or antibacterial soaps.  If your skin becomes reddened/irritated stop using the CHG and inform your nurse when you arrive at Short Stay. Do not shave (including legs and underarms) for at least 48 hours prior to the first CHG shower.  You may shave your face/neck. Please follow these instructions carefully:  1.  Shower with CHG Soap the night before surgery and the  morning of Surgery.  2.  If you choose to wash your hair, wash  your hair first as usual with your  normal  shampoo.  3.  After you shampoo, rinse your hair and body thoroughly to remove the  shampoo.                           4.  Use CHG as you would any other liquid soap.  You can apply chg directly  to the skin and wash                       Gently with a scrungie or clean washcloth.  5.  Apply the CHG Soap to your body ONLY FROM THE NECK DOWN.   Do not use on face/ open                           Wound or open sores. Avoid contact with eyes, ears mouth and genitals (private parts).                       Wash face,  Genitals (private parts) with your normal soap.             6.  Wash thoroughly, paying special attention to the area where your surgery  will be performed.  7.  Thoroughly rinse your body with warm water from the neck down.  8.  DO NOT shower/wash with your normal soap after using and rinsing off  the CHG Soap.                9.  Pat yourself dry with a clean towel.            10.  Wear clean pajamas.            11.  Place clean sheets on your bed the night of your first shower and do not  sleep with pets. Day of Surgery : Do not apply any lotions/deodorants the morning of surgery.  Please wear clean clothes to the hospital/surgery center.  FAILURE TO FOLLOW THESE INSTRUCTIONS MAY RESULT IN THE CANCELLATION OF YOUR SURGERY PATIENT SIGNATURE_________________________________  NURSE SIGNATURE__________________________________  ________________________________________________________________________            DUE TO COVID-19 ONLY ONE VISITOR IS ALLOWED TO COME WITH YOU AND STAY IN THE WAITING ROOM ONLY DURING PRE OP AND PROCEDURE DAY OF SURGERY. THE 1 VISITOR  MAY VISIT WITH YOU AFTER SURGERY IN YOUR PRIVATE ROOM DURING VISITING HOURS ONLY!  YOU NEED TO HAVE A COVID 19 TEST 12/05/2019 @_______ , THIS TEST MUST BE DONE BEFORE SURGERY,  COVID TESTING SITE [address] [place] [address], IT IS ON THE RIGHT GOING OUT [address] APPROXIMATELY  2 MINUTES PAST [employer] ON THE RIGHT. ONCE YOUR COVID TEST IS COMPLETED,  PLEASE BEGIN THE QUARANTINE INSTRUCTIONS AS OUTLINED IN YOUR HANDOUT.                Debra [name]  11/28/2019   Your procedure is scheduled on: 12/08/19   Report to [facility] Main  Entrance   Report to admitting at    1130 AM     Call this number if you have problems the morning of surgery [phone]    Remember: Do not eat food , candy gum or mints :After Midnight. You may have clear liquids from midnight until 1045am    CLEAR LIQUID DIET   Foods Allowed                                                                       Coffee and tea, regular and decaf                              Plain Jell-O any favor except red or purple                                            Fruit ices (not with fruit pulp)                                      Iced Popsicles  Carbonated beverages, regular and diet                                    Cranberry, grape and apple juices Sports drinks like Gatorade Lightly seasoned clear broth or consume(fat free) Sugar, honey syrup   _____________________________________________________________________    BRUSH YOUR TEETH MORNING OF SURGERY AND RINSE YOUR MOUTH OUT, NO CHEWING GUM CANDY OR MINTS.     Take these medicines the morning of surgery with A SIP OF WATER:                                      toprol  DO NOT TAKE ANY DIABETIC MEDICATIONS DAY OF YOUR SURGERY                               You may not have any metal on your body including hair pins and              piercings  Do not wear jewelry, make-up, lotions, powders or perfumes, deodorant             Do not wear nail polish on your fingernails.  Do not shave  48 hours prior to surgery.              Men may shave face and neck.   Do not bring valuables to the hospital. [place].  Contacts, dentures or bridgework may not be worn into surgery.  Leave suitcase in the car. After surgery it may be brought to your room.     Patients discharged the day of surgery will not be allowed to drive home. IF YOU ARE HAVING SURGERY AND GOING HOME THE SAME DAY, YOU MUST HAVE AN ADULT TO DRIVE YOU HOME AND BE WITH YOU FOR 24 HOURS. YOU MAY GO HOME BY TAXI OR UBER OR ORTHERWISE, BUT AN ADULT MUST ACCOMPANY YOU HOME AND STAY WITH YOU FOR 24 HOURS.  Name and phone number of your driver:  Special Instructions: N/A              Please read over the following fact sheets you were given: _____________________________________________________________________  [facility] - Preparing for Surgery Before surgery, you can play an important role.  Because skin is not sterile, your skin needs to be as free of germs as possible.  You can reduce the number of germs on your skin by washing with CHG (chlorahexidine gluconate) soap before surgery.  CHG is an antiseptic cleaner which kills germs and bonds with the skin to continue killing germs even after washing. Please DO NOT use if you have an allergy to CHG or antibacterial soaps.  If your skin becomes reddened/irritated stop using the CHG and inform your nurse when you arrive at Short Stay. Do not shave (including legs and underarms) for at least 48 hours prior to the first CHG shower.  You may shave your face/neck. Please follow these instructions carefully:  1.  Shower with CHG Soap the night before surgery and the  morning of Surgery.  2.  If you choose to wash your hair, wash your hair first as usual with your  normal  shampoo.  3.  After you shampoo, rinse your hair and body thoroughly to remove the  shampoo.                           4.  Use CHG as you would any other liquid soap.  You can apply chg directly  to the skin and wash                       Gently with a scrungie or clean washcloth.  5.  Apply the CHG Soap to your body ONLY  FROM THE NECK DOWN.   Do not use on face/ open                           Wound or open sores. Avoid contact with eyes, ears mouth and genitals (private parts).                       Wash face,  Genitals (private parts) with your normal soap.             6.  Wash thoroughly, paying special attention to the area where your surgery  will be performed.  7.  Thoroughly rinse your body with warm water from the neck down.  8.  DO NOT shower/wash with your normal soap after using and rinsing off  the CHG Soap.                9.  Pat yourself dry with a clean towel.            10.  Wear clean pajamas.            11.  Place clean sheets on your bed the night of your first shower and do not  sleep with pets. Day of Surgery : Do not apply any lotions/deodorants the morning of surgery.  Please wear clean clothes to the hospital/surgery center.  FAILURE TO FOLLOW THESE INSTRUCTIONS MAY RESULT IN THE CANCELLATION OF YOUR SURGERY PATIENT SIGNATURE_________________________________  NURSE SIGNATURE__________________________________  ________________________________________________________________________

## 2019-11-28 NOTE — Progress Notes (Signed)
Need orders in epic.  Surgery on 12/08/19.  proep on 11/29/19.

## 2019-11-28 NOTE — Progress Notes (Deleted)
DUE TO COVID-19 ONLY ONE VISITOR IS ALLOWED TO COME WITH YOU AND STAY IN THE WAITING ROOM ONLY DURING PRE OP AND PROCEDURE DAY OF SURGERY. THE 1 VISITOR  MAY VISIT WITH YOU AFTER SURGERY IN YOUR PRIVATE ROOM DURING VISITING HOURS ONLY!  YOU NEED TO HAVE A COVID 19 TEST ON_______ @_______ , THIS TEST MUST BE DONE BEFORE SURGERY,  COVID TESTING SITE [address] [place] [address], IT IS ON THE RIGHT GOING OUT [address] APPROXIMATELY  2 MINUTES PAST [employer] ON THE RIGHT. ONCE YOUR COVID TEST IS COMPLETED,  PLEASE BEGIN THE QUARANTINE INSTRUCTIONS AS OUTLINED IN YOUR HANDOUT.                Debra [name]  11/28/2019   Your procedure is scheduled on:    Report to [facility] Main  Entrance   Report to admitting at AM     Call this number if you have problems the morning of surgery [phone]    Remember: Do not eat food , candy gum or mints :After Midnight. You may have clear liquids from midnight until     CLEAR LIQUID DIET   Foods Allowed                                                                       Coffee and tea, regular and decaf                              Plain Jell-O any favor except red or purple                                            Fruit ices (not with fruit pulp)                                      Iced Popsicles                                     Carbonated beverages, regular and diet                                    Cranberry, grape and apple juices Sports drinks like Gatorade Lightly seasoned clear broth or consume(fat free) Sugar, honey syrup   _____________________________________________________________________    BRUSH YOUR TEETH MORNING OF SURGERY AND RINSE YOUR MOUTH OUT, NO CHEWING GUM CANDY OR MINTS.     Take these medicines the morning of surgery with A SIP OF WATER:   DO NOT TAKE ANY DIABETIC MEDICATIONS DAY OF YOUR SURGERY                               You may not have any metal  on your body including hair pins and  piercings  Do not wear jewelry, make-up, lotions, powders or perfumes, deodorant             Do not wear nail polish on your fingernails.  Do not shave  48 hours prior to surgery.              Men may shave face and neck.   Do not bring valuables to the hospital. [place].  Contacts, dentures or bridgework may not be worn into surgery.  Leave suitcase in the car. After surgery it may be brought to your room.     Patients discharged the day of surgery will not be allowed to drive home. IF YOU ARE HAVING SURGERY AND GOING HOME THE SAME DAY, YOU MUST HAVE AN ADULT TO DRIVE YOU HOME AND BE WITH YOU FOR 24 HOURS. YOU MAY GO HOME BY TAXI OR UBER OR ORTHERWISE, BUT AN ADULT MUST ACCOMPANY YOU HOME AND STAY WITH YOU FOR 24 HOURS.  Name and phone number of your driver:  Special Instructions: N/A              Please read over the following fact sheets you were given: _____________________________________________________________________  [facility] - Preparing for Surgery Before surgery, you can play an important role.  Because skin is not sterile, your skin needs to be as free of germs as possible.  You can reduce the number of germs on your skin by washing with CHG (chlorahexidine gluconate) soap before surgery.  CHG is an antiseptic cleaner which kills germs and bonds with the skin to continue killing germs even after washing. Please DO NOT use if you have an allergy to CHG or antibacterial soaps.  If your skin becomes reddened/irritated stop using the CHG and inform your nurse when you arrive at Short Stay. Do not shave (including legs and underarms) for at least 48 hours prior to the first CHG shower.  You may shave your face/neck. Please follow these instructions carefully:  1.  Shower with CHG Soap the night before surgery and the  morning of Surgery.  2.  If you choose to wash your hair, wash  your hair first as usual with your  normal  shampoo.  3.  After you shampoo, rinse your hair and body thoroughly to remove the  shampoo.                           4.  Use CHG as you would any other liquid soap.  You can apply chg directly  to the skin and wash                       Gently with a scrungie or clean washcloth.  5.  Apply the CHG Soap to your body ONLY FROM THE NECK DOWN.   Do not use on face/ open                           Wound or open sores. Avoid contact with eyes, ears mouth and genitals (private parts).                       Wash face,  Genitals (private parts) with your normal soap.             6.  Wash thoroughly, paying special attention to the area where your surgery  will be performed.  7.  Thoroughly rinse your body with warm water from the neck down.  8.  DO NOT shower/wash with your normal soap after using and rinsing off  the CHG Soap.                9.  Pat yourself dry with a clean towel.            10.  Wear clean pajamas.            11.  Place clean sheets on your bed the night of your first shower and do not  sleep with pets. Day of Surgery : Do not apply any lotions/deodorants the morning of surgery.  Please wear clean clothes to the hospital/surgery center.  FAILURE TO FOLLOW THESE INSTRUCTIONS MAY RESULT IN THE CANCELLATION OF YOUR SURGERY PATIENT SIGNATURE_________________________________  NURSE SIGNATURE__________________________________  ________________________________________________________________________            DUE TO COVID-19 ONLY ONE VISITOR IS ALLOWED TO COME WITH YOU AND STAY IN THE WAITING ROOM ONLY DURING PRE OP AND PROCEDURE DAY OF SURGERY. THE 1 VISITOR  MAY VISIT WITH YOU AFTER SURGERY IN YOUR PRIVATE ROOM DURING VISITING HOURS ONLY!  YOU NEED TO HAVE A COVID 19 TEST ON__9/28/2021 _____ @_______ , THIS TEST MUST BE DONE BEFORE SURGERY,  COVID TESTING SITE [address] [place] [address], IT IS ON THE RIGHT GOING OUT [address] APPROXIMATELY  2 MINUTES PAST [employer] ON THE RIGHT. ONCE YOUR COVID TEST IS COMPLETED,  PLEASE BEGIN THE QUARANTINE INSTRUCTIONS AS OUTLINED IN YOUR HANDOUT.                Debra [name]  11/28/2019   Your procedure is scheduled on: 12/08/19   Report to [facility] Main  Entrance   Report to admitting at     1130 AM     Call this number if you have problems the morning of surgery [phone]    Remember: Do not eat food , candy gum or mints :After Midnight. You may have clear liquids from midnight until 1045am    CLEAR LIQUID DIET   Foods Allowed                                                                       Coffee and tea, regular and decaf                              Plain Jell-O any favor except red or purple                                            Fruit ices (not with fruit pulp)                                      Iced Popsicles  Carbonated beverages, regular and diet                                    Cranberry, grape and apple juices Sports drinks like Gatorade Lightly seasoned clear broth or consume(fat free) Sugar, honey syrup   _____________________________________________________________________    BRUSH YOUR TEETH MORNING OF SURGERY AND RINSE YOUR MOUTH OUT, NO CHEWING GUM CANDY OR MINTS.     Take these medicines the morning of surgery with A SIP OF WATER:   toprol  DO NOT TAKE ANY DIABETIC MEDICATIONS DAY OF YOUR SURGERY                               You may not have any metal on your body including hair pins and              piercings  Do not wear jewelry, make-up, lotions, powders or perfumes, deodorant             Do not wear nail polish on your fingernails.  Do not shave  48 hours prior to surgery.              Men may shave face and neck.   Do not bring valuables to the hospital. [place].  Contacts, dentures  or bridgework may not be worn into surgery.  Leave suitcase in the car. After surgery it may be brought to your room.     Patients discharged the day of surgery will not be allowed to drive home. IF YOU ARE HAVING SURGERY AND GOING HOME THE SAME DAY, YOU MUST HAVE AN ADULT TO DRIVE YOU HOME AND BE WITH YOU FOR 24 HOURS. YOU MAY GO HOME BY TAXI OR UBER OR ORTHERWISE, BUT AN ADULT MUST ACCOMPANY YOU HOME AND STAY WITH YOU FOR 24 HOURS.  Name and phone number of your driver:  Special Instructions: N/A              Please read over the following fact sheets you were given: _____________________________________________________________________  [facility] - Preparing for Surgery Before surgery, you can play an important role.  Because skin is not sterile, your skin needs to be as free of germs as possible.  You can reduce the number of germs on your skin by washing with CHG (chlorahexidine gluconate) soap before surgery.  CHG is an antiseptic cleaner which kills germs and bonds with the skin to continue killing germs even after washing. Please DO NOT use if you have an allergy to CHG or antibacterial soaps.  If your skin becomes reddened/irritated stop using the CHG and inform your nurse when you arrive at Short Stay. Do not shave (including legs and underarms) for at least 48 hours prior to the first CHG shower.  You may shave your face/neck. Please follow these instructions carefully:  1.  Shower with CHG Soap the night before surgery and the  morning of Surgery.  2.  If you choose to wash your hair, wash your hair first as usual with your  normal  shampoo.  3.  After you shampoo, rinse your hair and body thoroughly to remove the  shampoo.  4.  Use CHG as you would any other liquid soap.  You can apply chg directly  to the skin and wash                       Gently with a scrungie or clean washcloth.  5.  Apply the CHG Soap to your body ONLY FROM THE NECK DOWN.   Do not use  on face/ open                           Wound or open sores. Avoid contact with eyes, ears mouth and genitals (private parts).                       Wash face,  Genitals (private parts) with your normal soap.             6.  Wash thoroughly, paying special attention to the area where your surgery  will be performed.  7.  Thoroughly rinse your body with warm water from the neck down.  8.  DO NOT shower/wash with your normal soap after using and rinsing off  the CHG Soap.                9.  Pat yourself dry with a clean towel.            10.  Wear clean pajamas.            11.  Place clean sheets on your bed the night of your first shower and do not  sleep with pets. Day of Surgery : Do not apply any lotions/deodorants the morning of surgery.  Please wear clean clothes to the hospital/surgery center.  FAILURE TO FOLLOW THESE INSTRUCTIONS MAY RESULT IN THE CANCELLATION OF YOUR SURGERY PATIENT SIGNATURE_________________________________  NURSE SIGNATURE__________________________________  ________________________________________________________________________

## 2019-11-29 ENCOUNTER — Encounter (HOSPITAL_COMMUNITY): Payer: Self-pay

## 2019-11-29 ENCOUNTER — Encounter (HOSPITAL_COMMUNITY)
Admission: RE | Admit: 2019-11-29 | Discharge: 2019-11-29 | Disposition: A | Discharge status: Home / Self Care | Payer: Medicare PPO | Source: Ambulatory Visit | Attending: Orthopaedic Surgery | Admitting: Orthopaedic Surgery

## 2019-11-29 ENCOUNTER — Other Ambulatory Visit: Payer: Self-pay

## 2019-11-29 DIAGNOSIS — Z01810 Encounter for preprocedural cardiovascular examination: Secondary | ICD-10-CM | POA: Diagnosis not present

## 2019-11-29 DIAGNOSIS — I4510 Unspecified right bundle-branch block: Secondary | ICD-10-CM | POA: Diagnosis not present

## 2019-11-29 DIAGNOSIS — Z01812 Encounter for preprocedural laboratory examination: Secondary | ICD-10-CM | POA: Diagnosis not present

## 2019-11-29 HISTORY — DX: Other specified postprocedural states: R11.2

## 2019-11-29 HISTORY — DX: Other specified postprocedural states: Z98.890

## 2019-11-29 LAB — CBC
HCT: 38.2 % (ref 36.0–46.0)
Hemoglobin: 12.3 g/dL (ref 12.0–15.0)
MCH: 28.5 pg (ref 26.0–34.0)
MCHC: 32.2 g/dL (ref 30.0–36.0)
MCV: 88.4 fL (ref 80.0–100.0)
Platelets: 199 10*3/uL (ref 150–400)
RBC: 4.32 MIL/uL (ref 3.87–5.11)
RDW: 13.5 % (ref 11.5–15.5)
WBC: 6.2 10*3/uL (ref 4.0–10.5)
nRBC: 0 % (ref 0.0–0.2)

## 2019-11-29 LAB — BASIC METABOLIC PANEL
Anion gap: 10 (ref 5–15)
BUN: 15 mg/dL (ref 8–23)
CO2: 26 mmol/L (ref 22–32)
Calcium: 8.9 mg/dL (ref 8.9–10.3)
Chloride: 106 mmol/L (ref 98–111)
Creatinine, Ser: 0.51 mg/dL (ref 0.44–1.00)
GFR calc Af Amer: >60 mL/min (ref >60)
GFR calc non Af Amer: >60 mL/min (ref >60)
Glucose, Bld: 102 mg/dL — ABNORMAL HIGH (ref 70–99)
Potassium: 4.4 mmol/L (ref 3.5–5.1)
Sodium: 142 mmol/L (ref 135–145)

## 2019-11-29 LAB — SURGICAL PCR SCREEN
MRSA, PCR: NEGATIVE
Staphylococcus aureus: NEGATIVE

## 2019-11-29 NOTE — Progress Notes (Signed)
Anesthesia Review:  PCP: Betty Martinique  Cardiologist : Tressia Miners turner Office visit for clearance Kathyrn Drown on 11/30/19 at cardiology  Chest x-ray : EKG :11/29/19  Echo :2020 Stress test:2018  Cardiac Cath :  Activity level: can do a flight of stairs without difficulty  Sleep Study/ CPAP :no Fasting Blood Sugar :      / Checks Blood Sugar -- times a day:   Blood Thinner/ Instructions /Last Dose: ASA / Instructions/ Last Dose :  81 mg Aspirin

## 2019-11-29 NOTE — Progress Notes (Addendum)
Patient states at time of preop appt that approx 3 weeks ago she was around someone who tested positive for covid a few days after the person was in her home.  Person was vaccinated.  Both her and her husband were covid tested and pt reports on 11/17/2019 covid test was negatve.  Patient reports she has been vaccinated.  And never had any symptoms.   Patient called back and verified that exposure was on 11/09/19 Tested on 11/15/19 and received report on 11/17/19 that covid test was negative.  Test was done at [facility] in [place], [place].

## 2019-11-30 ENCOUNTER — Ambulatory Visit: Payer: Medicare PPO | Admitting: Cardiology

## 2019-11-30 ENCOUNTER — Encounter: Payer: Self-pay | Admitting: Cardiology

## 2019-11-30 VITALS — BP 140/70 | HR 73 | Ht 62.0 in | Wt 123.8 lb

## 2019-11-30 DIAGNOSIS — Q231 Congenital insufficiency of aortic valve: Secondary | ICD-10-CM

## 2019-11-30 DIAGNOSIS — I2510 Atherosclerotic heart disease of native coronary artery without angina pectoris: Secondary | ICD-10-CM

## 2019-11-30 DIAGNOSIS — I712 Thoracic aortic aneurysm, without rupture: Secondary | ICD-10-CM

## 2019-11-30 DIAGNOSIS — I350 Nonrheumatic aortic (valve) stenosis: Secondary | ICD-10-CM | POA: Diagnosis not present

## 2019-11-30 DIAGNOSIS — I7121 Aneurysm of the ascending aorta, without rupture: Secondary | ICD-10-CM

## 2019-11-30 NOTE — Patient Instructions (Addendum)
Medication Instructions:  Your physician recommends that you continue on your current medications as directed. Please refer to the Current Medication list given to you today.  *If you need a refill on your cardiac medications before your next appointment, please call your pharmacy*   Lab Work: None  If you have labs (blood work) drawn today and your tests are completely normal, you will receive your results only by: Marland Kitchen MyChart Message (if you have MyChart) OR . A paper copy in the mail If you have any lab test that is abnormal or we need to change your treatment, we will call you to review the results.   Testing/Procedures:  Your physician has requested that you have an echocardiogram. Echocardiography is a painless test that uses sound waves to create images of your heart. It provides your doctor with information about the size and shape of your heart and how well your heart's chambers and valves are working. This procedure takes approximately one hour. There are no restrictions for this procedure.  This will be scheduled for 12/01/2019 or 12/04/2019 if there is availability either of these days. If no availability we will schedule you for late October/early November.   Follow-Up: At [facility], you and your health needs are our priority.  As part of our continuing mission to provide you with exceptional heart care, we have created designated Provider Care Teams.  These Care Teams include your primary Cardiologist (physician) and Advanced Practice Providers (APPs -  Physician Assistants and Nurse Practitioners) who all work together to provide you with the care you need, when you need it.    Your next appointment:   12 month(s)  The format for your next appointment:   In Person  Provider:   Fransico Him, MD

## 2019-12-01 ENCOUNTER — Other Ambulatory Visit: Payer: Self-pay | Admitting: Physician Assistant

## 2019-12-01 NOTE — Progress Notes (Signed)
Anesthesia Chart Review   Case: [id number] Date/Time: 12/08/19 1340   Procedure: LEFT TOTAL HIP ARTHROPLASTY ANTERIOR APPROACH (Left Hip)   Anesthesia type: Choice   Pre-op diagnosis: osteoarthritis left hip   Location: [facility] ROOM 10 / [facility]   Surgeons: Mcarthur Rossetti, MD      DISCUSSION:69 y.o. never smoker with h/o PONV, nonobstructive ASCAD(cath 2012 with 40-50% ostial narrowing of the LAD otherwise normal), HTN, thoracic aortic aneurysm 41 mm on CT followed by Dr. Darcey Nora, mild AS (mean gradient 15 mmHg, peak gradient 26.8 mmHg, valve area 1.06 cm2 on Echo 01/05/2019), colon cancer, left hip OA scheduled for above procedure 12/08/2019 with Dr. Jean Rosenthal.   Pt seen by cardiology 11/30/2019 for preoperative evaluation.  Per OV note, "-The patient does not have any unstable cardiac conditions. Upon evaluation today, shecan achieve 4 METs or greater without anginal symptoms.According to [facility] and AHA guidelines, sherequires no further cardiac workup prior to surgery and should be at acceptable risk. Her Revised Cardiac risk of peri-procedural MI or cardiac arrest following surgeryis low. -Obtain echocardiogram given known bicuspid AV, We discussed obtaining an echocardiogram however if unable to schdule prior to surgery she may still proceed with procedure and we will obtain in one months. She has no symptoms to indicate worsening of her AV valve."  Anticipate pt can proceed with planned procedure barring acute status change.   VS: BP (!) 150/73   Pulse (!) 59   Temp 36.8 C (Oral)   SpO2 100%   PROVIDERS: Martinique, Betty G, MD is PCP   Fransico Him, MD is Cardiologist  LABS: Labs reviewed: Acceptable for surgery. (all labs ordered are listed, but only abnormal results are displayed)  Labs Reviewed  BASIC METABOLIC PANEL - Abnormal; Notable for the following components:      Result Value   Glucose, Bld 102 (*)    All other components within normal limits  SURGICAL  PCR SCREEN  CBC  TYPE AND SCREEN     IMAGES:   EKG: 11/29/2019 Rate 60 bpm  NSR RBBB   CV: Echo 01/05/2019 IMPRESSIONS    1. Left ventricular ejection fraction, by visual estimation, is 55 to  60%. The left ventricle has normal function. There is no left ventricular  hypertrophy.  2. Left ventricular diastolic parameters are consistent with Grade II  diastolic dysfunction (pseudonormalization).  3. Global right ventricle has normal systolic function.The right  ventricular size is normal. No increase in right ventricular wall  thickness.  4. Left atrial size was normal.  5. Right atrial size was normal.  6. The mitral valve is normal in structure. Mild mitral valve  regurgitation.  7. The tricuspid valve is normal in structure. Tricuspid valve  regurgitation is trivial.  8. The aortic valve is bicuspid. Aortic valve regurgitation is trivial.  9. The pulmonic valve was grossly normal. Pulmonic valve regurgitation is  trivial.  10. Aortic dilatation noted.  11. There is mild dilatation of the ascending aorta measuring 37 mm.  12. Mildly elevated pulmonary artery systolic pressure.  13. The atrial septum is grossly normal.  Myocardial perfusion 12/17/2016  Nuclear stress EF: 65%.  Blood pressure demonstrated a hypertensive response to exercise.  Downsloping ST segment depression ST segment depression of 1 mm was noted during stress in the II, III, aVF, V6, V5 and V4 leads.  The study is normal.  This is a low risk study.   Normal perfusion. LVEF 65% with normal wall motion. This is a low  risk study.  Past Medical History:  Diagnosis Date  . Aortic aneurysm ([place])   . Arthritis    "moderate in back" (11/15/2014)  . Bicuspid aortic valve    mild AS by echo 12/2016  . 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 Coronary artery disease    nonobstructive  . H/O echocardiogram    bicuspid  aortic valve with moderate aortic valve sclerosis,mild PR.TR.MR and mildly dilated aorta by echo on 8.2012  . Heart murmur   . History of kidney stones   . Hypertension   . Iron deficiency anemia    "can't tolerate the supplements right now" (11/15/2014)  . Mild mitral regurgitation   . PONV (postoperative nausea and vomiting)    PREOP YEARS AGO WITH HYSTERECTOMY FELT " CREEPY" PRIOR TO SURGERY   . Thoracic aortic aneurysm ([facility])    mild, 4.1 cm by recent CT followed by Dr Darcey Nora    Past Surgical History:  Procedure Laterality Date  . ABDOMINAL HYSTERECTOMY    . BOWEL RESECTION N/A 11/15/2014   Procedure: SMALL BOWEL RESECTION;  Surgeon: [name], MD;  Location: [facility];  Service: General;  Laterality: N/A;  . CATARACT EXTRACTION W/ INTRAOCULAR LENS IMPLANT    . COLON SURGERY    . COLONOSCOPY  10/15/11   diverticulosis present, mild severity, no polyps, repeat in 10 years.  Marland Kitchen LAPAROSCOPIC LYSIS OF ADHESIONS N/A 05/14/2014   Procedure: LAPAROSCOPIC LYSIS OF ADHESIONS x 35mins;  Surgeon: [name], MD;  Location: [facility];  Service: General;  Laterality: N/A;  . LAPAROSCOPIC SMALL BOWEL RESECTION N/A 05/14/2014   Procedure: LAPAROSCOPIC ASSISTED SMALL BOWEL RESECTION;  Surgeon: [name], MD;  Location: [facility];  Service: General;  Laterality: N/A;  . SMALL INTESTINE SURGERY  11/15/2014  . TONSILLECTOMY      MEDICATIONS: . aspirin 81 MG tablet  . clindamycin (CLEOCIN) 150 MG capsule  . losartan (COZAAR) 25 MG tablet  . metoprolol succinate (TOPROL-XL) 50 MG 24 hr tablet  . simvastatin (ZOCOR) 20 MG tablet   No current facility-administered medications for this encounter.    Konrad Felix, PA-C [facility] Pre-Surgical Testing [phone]

## 2019-12-01 NOTE — Anesthesia Preprocedure Evaluation (Addendum)
Anesthesia Evaluation  Patient identified by MRN, date of birth, ID band Patient awake    Reviewed: Allergy & Precautions, NPO status , Patient's Chart, lab work & pertinent test results, reviewed documented beta blocker date and time   History of Anesthesia Complications (+) PONV and history of anesthetic complications  Airway Mallampati: II  TM Distance: >3 FB Neck ROM: Full    Dental no notable dental hx. (+) Teeth Intact, Caps, Dental Advisory Given   Pulmonary neg pulmonary ROS,    Pulmonary exam normal breath sounds clear to auscultation       Cardiovascular hypertension, Pt. on medications and Pt. on home beta blockers + CAD  + Valvular Problems/Murmurs AS  Rhythm:Regular Rate:Normal + Systolic murmurs Echo 69/45/03 1. Left ventricular ejection fraction, by visual estimation, is 55 to 60%. The left ventricle has normal function. There is no left ventricular hypertrophy.  2. Left ventricular diastolic parameters are consistent with Grade II diastolic dysfunction (pseudonormalization).  3. Global right ventricle has normal systolic function.The right ventricular size is normal. No increase in right ventricular wall thickness.  4. Left atrial size was normal.  5. Right atrial size was normal.  6. The mitral valve is normal in structure. Mild mitral valve  regurgitation.  7. The tricuspid valve is normal in structure. Tricuspid valve  regurgitation is trivial.  8. The aortic valve is bicuspid. Aortic valve regurgitation is trivial.  9. The pulmonic valve was grossly normal. Pulmonic valve regurgitation is trivial.  10. Aortic dilatation noted.  11. There is mild dilatation of the ascending aorta measuring 37 mm.  12. Mildly elevated pulmonary artery systolic pressure.  13. The atrial septum is grossly normal.    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   Endo/Other  negative endocrine ROS  Renal/GU negative Renal ROS  negative genitourinary   Musculoskeletal  (+) Arthritis ,   Abdominal   Peds  Hematology  (+) anemia ,   Anesthesia Other Findings   Reproductive/Obstetrics                           Anesthesia Physical Anesthesia Plan  ASA: II  Anesthesia Plan: Spinal   Post-op Pain Management:    Induction: Intravenous  PONV Risk Score and Plan: 4 or greater and Treatment may vary due to age or medical condition and Ondansetron  Airway Management Planned: Natural Airway, Nasal Cannula and Simple Face Mask  Additional Equipment:   Intra-op Plan:   Post-operative Plan:   Informed Consent: I have reviewed the patients History and Physical, chart, labs and discussed the procedure including the risks, benefits and alternatives for the proposed anesthesia with the patient or authorized representative who has indicated his/her understanding and acceptance.     Dental advisory given  Plan Discussed with: CRNA and Anesthesiologist  Anesthesia Plan Comments: (See PAT note 11/30/2019, Konrad Felix, PA-C)       Anesthesia Quick Evaluation

## 2019-12-05 ENCOUNTER — Other Ambulatory Visit (HOSPITAL_COMMUNITY)
Admission: RE | Admit: 2019-12-05 | Discharge: 2019-12-05 | Disposition: A | Discharge status: Home / Self Care | Payer: Medicare PPO | Source: Ambulatory Visit | Attending: Orthopaedic Surgery | Admitting: Orthopaedic Surgery

## 2019-12-05 DIAGNOSIS — Z01812 Encounter for preprocedural laboratory examination: Secondary | ICD-10-CM | POA: Insufficient documentation

## 2019-12-05 DIAGNOSIS — Z20822 Contact with and (suspected) exposure to covid-19: Secondary | ICD-10-CM | POA: Diagnosis not present

## 2019-12-05 LAB — SARS CORONAVIRUS 2 (TAT 6-24 HRS): SARS Coronavirus 2: NEGATIVE

## 2019-12-07 ENCOUNTER — Other Ambulatory Visit: Payer: Self-pay | Admitting: Orthopaedic Surgery

## 2019-12-07 ENCOUNTER — Telehealth: Payer: Self-pay | Admitting: *Deleted

## 2019-12-07 MED ORDER — METHOCARBAMOL 500 MG PO TABS: 500.0000 mg | ORAL_TABLET | Freq: Four times a day (QID) | ORAL | 1 refills | Status: DC | PRN

## 2019-12-07 MED ORDER — ONDANSETRON 4 MG PO TBDP: 4.0000 mg | ORAL_TABLET | Freq: Three times a day (TID) | ORAL | 0 refills | Status: DC | PRN

## 2019-12-07 MED ORDER — ASPIRIN 81 MG PO CHEW: 81.0000 mg | CHEWABLE_TABLET | Freq: Two times a day (BID) | ORAL | 0 refills | Status: DC

## 2019-12-07 MED ORDER — TRAMADOL HCL 50 MG PO TABS: 50.0000 mg | ORAL_TABLET | Freq: Four times a day (QID) | ORAL | 0 refills | Status: DC | PRN

## 2019-12-07 NOTE — H&P (Signed)
TOTAL HIP ADMISSION H&P  Patient is admitted for left total hip arthroplasty.  Subjective:  Chief Complaint: left hip pain  HPI: Debra [name], 69 y.o. female, has a history of pain and functional disability in the left hip(s) due to arthritis and patient has failed non-surgical conservative treatments for greater than 12 weeks to include NSAID's and/or analgesics, corticosteriod injections, flexibility and strengthening excercises and activity modification.  Onset of symptoms was gradual starting 1 years ago with gradually worsening course since that time.The patient noted no past surgery on the left hip(s).  Patient currently rates pain in the left hip at 10 out of 10 with activity. Patient has night pain, worsening of pain with activity and weight bearing, pain that interfers with activities of daily living and pain with passive range of motion. Patient has evidence of subchondral cysts, subchondral sclerosis, periarticular osteophytes and joint space narrowing by imaging studies. This condition presents safety issues increasing the risk of falls.  There is no current active infection.  Patient Active Problem List   Diagnosis Date Noted  . Unilateral primary osteoarthritis, left hip 10/30/2019  . Bicuspid aortic valve   . SBO (small bowel obstruction) ([place]) 06/19/2014  . S/P small bowel resection 05/14/2014  . 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 Coronary atherosclerosis of native coronary artery 09/25/2013  . Pure hypercholesterolemia 09/25/2013  . Essential hypertension, benign 09/25/2013  . Small bowel mass 08/01/2013  . Abdominal pain 08/01/2013  . Ascending aortic aneurysm ([place]) 08/01/2013   Past Medical History:  Diagnosis Date  . Aortic aneurysm ([place])   . Arthritis    "moderate in back" (11/15/2014)  . Bicuspid aortic valve    mild AS by echo 12/2016  . Carcinoid tumor of small intestine, malignant ([place]) dx'd 05/2014  . Colon cancer ([place])     NEUROENDOCRINE   . Coronary artery disease    nonobstructive  . H/O echocardiogram    bicuspid aortic valve with moderate aortic valve sclerosis,mild PR.TR.MR and mildly dilated aorta by echo on 8.2012  . Heart murmur   . History of kidney stones   . Hypertension   . Iron deficiency anemia    "can't tolerate the supplements right now" (11/15/2014)  . Mild mitral regurgitation   . PONV (postoperative nausea and vomiting)    PREOP YEARS AGO WITH HYSTERECTOMY FELT " CREEPY" PRIOR TO SURGERY   . Thoracic aortic aneurysm ([facility])    mild, 4.1 cm by recent CT followed by Dr Darcey Nora    Past Surgical History:  Procedure Laterality Date  . ABDOMINAL HYSTERECTOMY    . BOWEL RESECTION N/A 11/15/2014   Procedure: SMALL BOWEL RESECTION;  Surgeon: [name], MD;  Location: [facility];  Service: General;  Laterality: N/A;  . CATARACT EXTRACTION W/ INTRAOCULAR LENS IMPLANT    . COLON SURGERY    . COLONOSCOPY  10/15/11   diverticulosis present, mild severity, no polyps, repeat in 10 years.  Marland Kitchen LAPAROSCOPIC LYSIS OF ADHESIONS N/A 05/14/2014   Procedure: LAPAROSCOPIC LYSIS OF ADHESIONS x 49mins;  Surgeon: [name], MD;  Location: [facility];  Service: General;  Laterality: N/A;  . LAPAROSCOPIC SMALL BOWEL RESECTION N/A 05/14/2014   Procedure: LAPAROSCOPIC ASSISTED SMALL BOWEL RESECTION;  Surgeon: [name], MD;  Location: [facility];  Service: General;  Laterality: N/A;  . SMALL INTESTINE SURGERY  11/15/2014  . TONSILLECTOMY      No current facility-administered medications for this encounter.   Current Outpatient Medications  Medication Sig Dispense  Refill Last Dose  . clindamycin (CLEOCIN) 150 MG capsule Take 600 mg by mouth See admin instructions. Take 600 mg by mouth prior to dental appointments     . losartan (COZAAR) 25 MG tablet Take 1 tablet (25 mg total) by mouth daily. 90 tablet 3   . metoprolol succinate (TOPROL-XL) 50 MG 24 hr tablet TAKE 1 TABLET BY MOUTH EVERY DAY 90 tablet 3   . simvastatin  (ZOCOR) 20 MG tablet TAKE 1 TABLET (20 MG TOTAL) BY MOUTH DAILY AT 6 PM. 90 tablet 3   . aspirin (ASPIRIN 81) 81 MG chewable tablet Chew 1 tablet (81 mg total) by mouth 2 (two) times daily after a meal. 30 tablet 0   . methocarbamol (ROBAXIN) 500 MG tablet Take 1 tablet (500 mg total) by mouth every 6 (six) hours as needed. 40 tablet 1   . ondansetron (ZOFRAN ODT) 4 MG disintegrating tablet Take 1 tablet (4 mg total) by mouth every 8 (eight) hours as needed for nausea or vomiting. 20 tablet 0   . traMADol (ULTRAM) 50 MG tablet Take 1-2 tablets (50-100 mg total) by mouth every 6 (six) hours as needed. 30 tablet 0    Allergies  Allergen Reactions  . Codeine Anaphylaxis and Shortness Of Breath  . Opium Anaphylaxis  . Oxycodone Shortness Of Breath  . Penicillins Anaphylaxis and Shortness Of Breath    Social History   Tobacco Use  . Smoking status: Never Smoker  . Smokeless tobacco: Never Used  Substance Use Topics  . Alcohol use: Yes    Alcohol/week: 4.0 standard drinks    Types: 4 Glasses of wine per week    Comment: 5 GLASSES OF WINE PER WEEK     Family History  Problem Relation Age of Onset  . CAD Maternal Grandfather   . CAD Paternal Grandmother   . Heart attack Paternal Grandmother   . CAD Paternal Grandfather   . Breast cancer Mother   . Cancer Father   . Diabetes Father   . Stroke Neg Hx   . Hypertension Neg Hx      Review of Systems  All other systems reviewed and are negative.   Objective:  Physical Exam Vitals reviewed.  Constitutional:      Appearance: Normal appearance.  HENT:     Head: Normocephalic and atraumatic.  Eyes:     Extraocular Movements: Extraocular movements intact.     Pupils: Pupils are equal, round, and reactive to light.  Cardiovascular:     Rate and Rhythm: Normal rate.     Pulses: Normal pulses.  Pulmonary:     Effort: Pulmonary effort is normal.     Breath sounds: Normal breath sounds.  Abdominal:     Palpations: Abdomen is soft.   Musculoskeletal:     Cervical back: Normal range of motion and neck supple.     Left hip: Tenderness and bony tenderness present. Decreased range of motion. Decreased strength.  Neurological:     Mental Status: She is alert and oriented to person, place, and time.  Psychiatric:        Behavior: Behavior normal.     Vital signs in last 24 hours:    Labs:   Estimated body mass index is 22.64 kg/m as calculated from the following:   Height as of 11/30/19: 5\' 2"  (1.575 m).   Weight as of 11/30/19: 56.2 kg.   Imaging Review Plain radiographs demonstrate severe degenerative joint disease of the left hip(s). The bone quality  appears to be excellent for age and reported activity level.      Assessment/Plan:  End stage arthritis, left hip(s)  The patient history, physical examination, clinical judgement of the provider and imaging studies are consistent with end stage degenerative joint disease of the left hip(s) and total hip arthroplasty is deemed medically necessary. The treatment options including medical management, injection therapy, arthroscopy and arthroplasty were discussed at length. The risks and benefits of total hip arthroplasty were presented and reviewed. The risks due to aseptic loosening, infection, stiffness, dislocation/subluxation,  thromboembolic complications and other imponderables were discussed.  The patient acknowledged the explanation, agreed to proceed with the plan and consent was signed. Patient is being admitted for inpatient treatment for surgery, pain control, PT, OT, prophylactic antibiotics, VTE prophylaxis, progressive ambulation and ADL's and discharge planning.The patient is planning to be discharged home with home health services

## 2019-12-07 NOTE — Progress Notes (Signed)
Phone call received from [employer] at office of DR Zollie Beckers.  MD wants pt to have preop drink prior to surgery even though orders placed after preop apptl  Called pt at home and husband is coming in to pick up preop drink since pt in quarantine.  Ensure preop drink placed out front with [place] in Admitting.  Husband aware to step into Admitting to pick up drink.  Reviewed preop instructions for drijk with patient and pt is to complete at 0945am in am.

## 2019-12-07 NOTE — Telephone Encounter (Signed)
Ortho bundle pre-op call completed. 

## 2019-12-07 NOTE — Care Plan (Signed)
RNCM spoke with patient prior to her upcoming Left total hip arthroplasty with Dr. Ninfa Linden. She is an ortho bundle patient through [facility] and she is agreeable to case management. She will also be a same day discharge/ambulatory surgery. Reviewed all post-op care questions. She has a husband that will be assisting after discharge. She has a FWW at home and declines need for 3in1/BSC. No other DME needed at this time. Anticipate HHPT will be needed. Referral made to [facility] after choice provided. Provided CM number for questions/needs. Will continue to follow for all other post-op needs.

## 2019-12-07 NOTE — Progress Notes (Signed)
No orders in at time of preop appt.  No drink given.

## 2019-12-08 ENCOUNTER — Ambulatory Visit (HOSPITAL_COMMUNITY): Payer: Medicare PPO | Admitting: Physician Assistant

## 2019-12-08 ENCOUNTER — Ambulatory Visit (HOSPITAL_COMMUNITY)
Admission: RE | Admit: 2019-12-08 | Discharge: 2019-12-08 | Disposition: A | Discharge status: Home / Self Care | Payer: Medicare PPO | Attending: Orthopaedic Surgery | Admitting: Orthopaedic Surgery

## 2019-12-08 ENCOUNTER — Ambulatory Visit (HOSPITAL_COMMUNITY): Payer: Medicare PPO

## 2019-12-08 ENCOUNTER — Encounter (HOSPITAL_COMMUNITY): Payer: Self-pay | Admitting: Orthopaedic Surgery

## 2019-12-08 ENCOUNTER — Encounter (HOSPITAL_COMMUNITY)
Admission: RE | Disposition: A | Discharge status: Home / Self Care | Payer: Self-pay | Source: Home / Self Care | Attending: Orthopaedic Surgery

## 2019-12-08 ENCOUNTER — Other Ambulatory Visit: Payer: Self-pay

## 2019-12-08 ENCOUNTER — Ambulatory Visit (HOSPITAL_COMMUNITY): Payer: Medicare PPO | Admitting: Anesthesiology

## 2019-12-08 DIAGNOSIS — R2689 Other abnormalities of gait and mobility: Secondary | ICD-10-CM | POA: Diagnosis not present

## 2019-12-08 DIAGNOSIS — I712 Thoracic aortic aneurysm, without rupture: Secondary | ICD-10-CM | POA: Insufficient documentation

## 2019-12-08 DIAGNOSIS — Z419 Encounter for procedure for purposes other than remedying health state, unspecified: Secondary | ICD-10-CM

## 2019-12-08 DIAGNOSIS — Z880 Allergy status to penicillin: Secondary | ICD-10-CM | POA: Diagnosis not present

## 2019-12-08 DIAGNOSIS — Z90710 Acquired absence of both cervix and uterus: Secondary | ICD-10-CM | POA: Diagnosis not present

## 2019-12-08 DIAGNOSIS — Z803 Family history of malignant neoplasm of breast: Secondary | ICD-10-CM | POA: Insufficient documentation

## 2019-12-08 DIAGNOSIS — I2510 Atherosclerotic heart disease of native coronary artery without angina pectoris: Secondary | ICD-10-CM | POA: Diagnosis not present

## 2019-12-08 DIAGNOSIS — M1612 Unilateral primary osteoarthritis, left hip: Secondary | ICD-10-CM | POA: Diagnosis not present

## 2019-12-08 DIAGNOSIS — M6281 Muscle weakness (generalized): Secondary | ICD-10-CM | POA: Insufficient documentation

## 2019-12-08 DIAGNOSIS — Z79899 Other long term (current) drug therapy: Secondary | ICD-10-CM | POA: Insufficient documentation

## 2019-12-08 DIAGNOSIS — Z96642 Presence of left artificial hip joint: Secondary | ICD-10-CM | POA: Diagnosis not present

## 2019-12-08 DIAGNOSIS — I10 Essential (primary) hypertension: Secondary | ICD-10-CM | POA: Diagnosis not present

## 2019-12-08 DIAGNOSIS — Z8249 Family history of ischemic heart disease and other diseases of the circulatory system: Secondary | ICD-10-CM | POA: Insufficient documentation

## 2019-12-08 DIAGNOSIS — Z7982 Long term (current) use of aspirin: Secondary | ICD-10-CM | POA: Diagnosis not present

## 2019-12-08 DIAGNOSIS — E7800 Pure hypercholesterolemia, unspecified: Secondary | ICD-10-CM | POA: Diagnosis not present

## 2019-12-08 DIAGNOSIS — Z8619 Personal history of other infectious and parasitic diseases: Secondary | ICD-10-CM | POA: Insufficient documentation

## 2019-12-08 DIAGNOSIS — Z885 Allergy status to narcotic agent status: Secondary | ICD-10-CM | POA: Diagnosis not present

## 2019-12-08 DIAGNOSIS — Z85038 Personal history of other malignant neoplasm of large intestine: Secondary | ICD-10-CM | POA: Insufficient documentation

## 2019-12-08 DIAGNOSIS — Q231 Congenital insufficiency of aortic valve: Secondary | ICD-10-CM | POA: Insufficient documentation

## 2019-12-08 DIAGNOSIS — Z471 Aftercare following joint replacement surgery: Secondary | ICD-10-CM | POA: Diagnosis not present

## 2019-12-08 DIAGNOSIS — Z800 Family history of malignant neoplasm of digestive organs: Secondary | ICD-10-CM | POA: Diagnosis not present

## 2019-12-08 HISTORY — PX: TOTAL HIP ARTHROPLASTY: SHX124

## 2019-12-08 LAB — TYPE AND SCREEN
ABO/RH(D): O NEG
Antibody Screen: NEGATIVE

## 2019-12-08 SURGERY — ARTHROPLASTY, HIP, TOTAL, ANTERIOR APPROACH
Anesthesia: Spinal | Site: Hip | Laterality: Left

## 2019-12-08 MED ORDER — ORAL CARE MOUTH RINSE: 15.0000 mL | Freq: Once | OROMUCOSAL | Status: AC

## 2019-12-08 MED ORDER — PHENYLEPHRINE HCL (PRESSORS) 10 MG/ML IV SOLN
INTRAVENOUS | Status: AC
  Filled 2019-12-08: qty 1

## 2019-12-08 MED ORDER — 0.9 % SODIUM CHLORIDE (POUR BTL) OPTIME
TOPICAL | Status: DC | PRN
  Administered 2019-12-08: 1000 mL

## 2019-12-08 MED ORDER — ONDANSETRON HCL 4 MG/2ML IJ SOLN
INTRAMUSCULAR | Status: AC
  Filled 2019-12-08: qty 2

## 2019-12-08 MED ORDER — BUPIVACAINE HCL 0.25 % IJ SOLN
INTRAMUSCULAR | Status: DC | PRN
  Administered 2019-12-08: 30 mL

## 2019-12-08 MED ORDER — ONDANSETRON HCL 4 MG/2ML IJ SOLN: 4.0000 mg | Freq: Once | INTRAMUSCULAR | Status: DC | PRN

## 2019-12-08 MED ORDER — METHOCARBAMOL 500 MG IVPB - SIMPLE MED: 500.0000 mg | Freq: Four times a day (QID) | INTRAVENOUS | Status: DC | PRN

## 2019-12-08 MED ORDER — DEXAMETHASONE SODIUM PHOSPHATE 10 MG/ML IJ SOLN
INTRAMUSCULAR | Status: DC | PRN
  Administered 2019-12-08: 5 mg via INTRAVENOUS

## 2019-12-08 MED ORDER — EPHEDRINE 5 MG/ML INJ
INTRAVENOUS | Status: AC
  Filled 2019-12-08: qty 10

## 2019-12-08 MED ORDER — CLINDAMYCIN PHOSPHATE 900 MG/50ML IV SOLN
900.0000 mg | INTRAVENOUS | Status: AC
  Administered 2019-12-08: 900 mg via INTRAVENOUS

## 2019-12-08 MED ORDER — PROPOFOL 500 MG/50ML IV EMUL
INTRAVENOUS | Status: AC
  Filled 2019-12-08: qty 50

## 2019-12-08 MED ORDER — METHOCARBAMOL 500 MG PO TABS: 500.0000 mg | ORAL_TABLET | Freq: Four times a day (QID) | ORAL | Status: DC | PRN

## 2019-12-08 MED ORDER — FENTANYL CITRATE (PF) 100 MCG/2ML IJ SOLN: 25.0000 ug | INTRAMUSCULAR | Status: DC | PRN

## 2019-12-08 MED ORDER — MIDAZOLAM HCL 5 MG/5ML IJ SOLN
INTRAMUSCULAR | Status: DC | PRN
  Administered 2019-12-08 (×2): 1 mg via INTRAVENOUS

## 2019-12-08 MED ORDER — CHLORHEXIDINE GLUCONATE 0.12 % MT SOLN
15.0000 mL | Freq: Once | OROMUCOSAL | Status: AC
  Administered 2019-12-08: 15 mL via OROMUCOSAL

## 2019-12-08 MED ORDER — TRANEXAMIC ACID-NACL 1000-0.7 MG/100ML-% IV SOLN
INTRAVENOUS | Status: AC
  Filled 2019-12-08: qty 100

## 2019-12-08 MED ORDER — PROPOFOL 10 MG/ML IV BOLUS
INTRAVENOUS | Status: DC | PRN
  Administered 2019-12-08: 20 mg via INTRAVENOUS
  Administered 2019-12-08: 30 mg via INTRAVENOUS

## 2019-12-08 MED ORDER — BUPIVACAINE IN DEXTROSE 0.75-8.25 % IT SOLN
INTRATHECAL | Status: DC | PRN
  Administered 2019-12-08: 1.6 mL via INTRATHECAL

## 2019-12-08 MED ORDER — LIDOCAINE 2% (20 MG/ML) 5 ML SYRINGE
INTRAMUSCULAR | Status: AC
  Filled 2019-12-08: qty 5

## 2019-12-08 MED ORDER — LACTATED RINGERS IV BOLUS
250.0000 mL | Freq: Once | INTRAVENOUS | Status: AC
  Administered 2019-12-08: 250 mL via INTRAVENOUS

## 2019-12-08 MED ORDER — LACTATED RINGERS IV SOLN: INTRAVENOUS | Status: DC

## 2019-12-08 MED ORDER — POVIDONE-IODINE 10 % EX SWAB
2.0000 "application " | Freq: Once | CUTANEOUS | Status: AC
  Administered 2019-12-08: 2 via TOPICAL

## 2019-12-08 MED ORDER — MIDAZOLAM HCL 2 MG/2ML IJ SOLN
INTRAMUSCULAR | Status: AC
  Filled 2019-12-08: qty 2

## 2019-12-08 MED ORDER — TRANEXAMIC ACID-NACL 1000-0.7 MG/100ML-% IV SOLN
1000.0000 mg | INTRAVENOUS | Status: AC
  Administered 2019-12-08: 1000 mg via INTRAVENOUS

## 2019-12-08 MED ORDER — BUPIVACAINE-EPINEPHRINE 0.25% -1:200000 IJ SOLN
INTRAMUSCULAR | Status: AC
  Filled 2019-12-08: qty 1

## 2019-12-08 MED ORDER — LACTATED RINGERS IV BOLUS
500.0000 mL | Freq: Once | INTRAVENOUS | Status: AC
  Administered 2019-12-08: 500 mL via INTRAVENOUS

## 2019-12-08 MED ORDER — ONDANSETRON 4 MG PO TBDP: 4.0000 mg | ORAL_TABLET | Freq: Three times a day (TID) | ORAL | 0 refills | Status: DC | PRN

## 2019-12-08 MED ORDER — CLINDAMYCIN PHOSPHATE 900 MG/50ML IV SOLN
INTRAVENOUS | Status: AC
  Filled 2019-12-08: qty 50

## 2019-12-08 MED ORDER — FENTANYL CITRATE (PF) 100 MCG/2ML IJ SOLN
INTRAMUSCULAR | Status: DC | PRN
  Administered 2019-12-08: 50 ug via INTRAVENOUS

## 2019-12-08 MED ORDER — FENTANYL CITRATE (PF) 100 MCG/2ML IJ SOLN
INTRAMUSCULAR | Status: AC
  Filled 2019-12-08: qty 2

## 2019-12-08 MED ORDER — DEXAMETHASONE SODIUM PHOSPHATE 10 MG/ML IJ SOLN
INTRAMUSCULAR | Status: AC
  Filled 2019-12-08: qty 1

## 2019-12-08 MED ORDER — ONDANSETRON HCL 4 MG/2ML IJ SOLN
INTRAMUSCULAR | Status: DC | PRN
  Administered 2019-12-08: 4 mg via INTRAVENOUS

## 2019-12-08 MED ORDER — STERILE WATER FOR IRRIGATION IR SOLN
Status: DC | PRN
  Administered 2019-12-08: 2000 mL

## 2019-12-08 MED ORDER — EPHEDRINE SULFATE-NACL 50-0.9 MG/10ML-% IV SOSY
PREFILLED_SYRINGE | INTRAVENOUS | Status: DC | PRN
  Administered 2019-12-08 (×2): 5 mg via INTRAVENOUS
  Administered 2019-12-08: 10 mg via INTRAVENOUS

## 2019-12-08 MED ORDER — PROPOFOL 500 MG/50ML IV EMUL
INTRAVENOUS | Status: DC | PRN
  Administered 2019-12-08: 50 ug/kg/min via INTRAVENOUS

## 2019-12-08 MED ORDER — SODIUM CHLORIDE 0.9 % IR SOLN
Status: DC | PRN
  Administered 2019-12-08: 1000 mL

## 2019-12-08 SURGICAL SUPPLY — 40 items
APL SKNCLS STERI-STRIP NONHPOA (GAUZE/BANDAGES/DRESSINGS)
BAG SPEC THK2 15X12 ZIP CLS (MISCELLANEOUS)
BAG ZIPLOCK 12X15 (MISCELLANEOUS) IMPLANT
BENZOIN TINCTURE PRP APPL 2/3 (GAUZE/BANDAGES/DRESSINGS) IMPLANT
BLADE SAW SGTL 18X1.27X75 (BLADE) ×2 IMPLANT
COVER PERINEAL POST (MISCELLANEOUS) ×2 IMPLANT
COVER SURGICAL LIGHT HANDLE (MISCELLANEOUS) ×2 IMPLANT
COVER WAND RF STERILE (DRAPES) ×2 IMPLANT
CUP ACET PINNACLE SECTR 48MM (Joint) IMPLANT
DRAPE STERI IOBAN 125X83 (DRAPES) ×2 IMPLANT
DRAPE U-SHAPE 47X51 STRL (DRAPES) ×4 IMPLANT
DRSG AQUACEL AG ADV 3.5X10 (GAUZE/BANDAGES/DRESSINGS) ×2 IMPLANT
DURAPREP 26ML APPLICATOR (WOUND CARE) ×2 IMPLANT
ELECT REM PT RETURN 15FT ADLT (MISCELLANEOUS) ×2 IMPLANT
GAUZE XEROFORM 1X8 LF (GAUZE/BANDAGES/DRESSINGS) ×2 IMPLANT
GLOVE BIO SURGEON STRL SZ7.5 (GLOVE) ×2 IMPLANT
GLOVE BIOGEL PI IND STRL 8 (GLOVE) ×2 IMPLANT
GLOVE BIOGEL PI INDICATOR 8 (GLOVE) ×2
GLOVE ECLIPSE 8.0 STRL XLNG CF (GLOVE) ×2 IMPLANT
GOWN STRL REUS W/TWL XL LVL3 (GOWN DISPOSABLE) ×4 IMPLANT
HANDPIECE INTERPULSE COAX TIP (DISPOSABLE) ×2
HEAD FEM STD 32X+1 STRL (Hips) ×1 IMPLANT
HOLDER FOLEY CATH W/STRAP (MISCELLANEOUS) ×2 IMPLANT
KIT TURNOVER KIT A (KITS) IMPLANT
LINER ACET 32X48 (Liner) ×1 IMPLANT
PACK ANTERIOR HIP CUSTOM (KITS) ×2 IMPLANT
PENCIL SMOKE EVACUATOR (MISCELLANEOUS) IMPLANT
PINNSECTOR W/GRIP ACE CUP 48MM (Joint) ×2 IMPLANT
SET HNDPC FAN SPRY TIP SCT (DISPOSABLE) ×1 IMPLANT
STAPLER VISISTAT 35W (STAPLE) IMPLANT
STEM CORAIL KA09 (Stem) ×1 IMPLANT
STRIP CLOSURE SKIN 1/2X4 (GAUZE/BANDAGES/DRESSINGS) IMPLANT
SUT ETHIBOND NAB CT1 #1 30IN (SUTURE) ×2 IMPLANT
SUT ETHILON 2 0 PS N (SUTURE) IMPLANT
SUT MNCRL AB 4-0 PS2 18 (SUTURE) IMPLANT
SUT VIC AB 0 CT1 36 (SUTURE) ×2 IMPLANT
SUT VIC AB 1 CT1 36 (SUTURE) ×2 IMPLANT
SUT VIC AB 2-0 CT1 27 (SUTURE) ×4
SUT VIC AB 2-0 CT1 TAPERPNT 27 (SUTURE) ×2 IMPLANT
TRAY FOLEY MTR SLVR 16FR STAT (SET/KITS/TRAYS/PACK) IMPLANT

## 2019-12-08 NOTE — Op Note (Signed)
NAME: Debra [name], Debra [name] MEDICAL RECORD [id number] ACCOUNT [id number] DATE OF BIRTH:Jul 16, 1950 FACILITY: [facility] LOCATION: [facility] PHYSICIAN:Babita Amaker Kerry [place], MD  OPERATIVE REPORT  DATE OF PROCEDURE:  12/08/2019  PREOPERATIVE DIAGNOSIS:  Primary osteoarthritis and degenerative joint disease, left hip.  POSTOPERATIVE DIAGNOSIS:  Primary osteoarthritis and degenerative joint disease, left hip.  PROCEDURE:  Left total hip arthroplasty through direct anterior approach.  IMPLANTS:  DePuy Sector Gription acetabular component size 48, size 32+0 neutral polyethylene liner, size 9 Corail femoral component with standard offset, size 32+1 metal hip k  SURGEON:  Lind Guest. Ninfa Linden, MD  ASSISTANT:  Erskine Emery, PA-C  ANESTHESIA:  Spinal.  ANTIBIOTICS:  2 grams IV Ancef.  ESTIMATED BLOOD LOSS:  100-130 mL.  COMPLICATIONS:  None.  INDICATIONS:  The patient is a 69 year old female with debilitating arthritis involving her left hip.  This has been well documented.  At this point, her pain is daily and it is detrimentally affecting her mobility, her quality of life, and her  activities of daily living to the point, she does wish to proceed with a total hip arthroplasty.  I talked in detail to her about this, about the risk of acute blood loss anemia, nerve or vessel injury, fracture, infection, dislocation, DVT and implant  failure.  She understands our goals are to decrease pain, improve mobility, and overall improve quality of life.  DESCRIPTION OF PROCEDURE:  After informed consent was obtained and appropriate left hip was marked she was brought to the operating room and sat up on a stretcher where spinal anesthesia was then obtained.  She was laid in supine position on a stretcher.   Foley catheter was placed and traction boots were placed on both her feet.  She was placed supine on the Hana fracture table, the perineal post in place and both legs in line skeletal  traction devices and no traction applied.  Her left operative hip  was prepped and draped with DuraPrep and sterile drapes.  A time-out was called and she was identified as correct patient, correct left hip.  I then made an incision just inferior and posterior to the anterior iliac spine and carried this obliquely down  the leg.  We dissected down tensor fascia lata muscle.  Tensor fascia was then divided longitudinally to proceed with direct anterior approach to the hip.  We identified and cauterized circumflex vessels and identified the hip capsule, opened the hip  capsule in an L-type format finding a moderate joint effusion, and significant osteoarthritis on the left hip femoral head and neck.  We placed Cobra retractors within the joint capsule and made our femoral neck cut with an oscillating saw just proximal  to the lesser trochanter.  We completed this with an osteotome.  We used a corkscrew guide to remove the femoral head.  We removed it in its entirety and found a wide area devoid of cartilage and then placed a bent Hohmann over the medial acetabular rim  and removed remnants of the acetabular labrum and other debris.  I then began reaming under direct visualization from a size 43 reamer, going up to a size 47.  With all reamers under direct visualization, the last reamer was also under direct  fluoroscopy, so we could obtain our depth of reaming, our inclination and anteversion.  I then placed the real DePuy Sector Gription acetabular component size 48 and a 32+0 neutral polyethylene liner.  Attention was then turned to the femur.  With the  leg externally rotated to 120  degrees, extended and adducted, we were able to place a Mueller retractor medially and Hohman retractor behind the greater trochanter, released lateral joint capsule and used a box-cutting osteotome to enter the femoral  canal and a rongeur to lateralize it.  I then began broaching using the Corail broaching system from a size 8  going up to just a size 9.  This was a tight fit.  We then went with standard offset femoral neck and a 32+1 trial hip ball, reduced this in the  acetabulum and it was tight and I felt like I could not get my stem any deeper and her offset was already enough that we would not go with a varus offset femoral neck, which would shorten her some, but I think it would tighten her too much.  We  dislocated the hip and removed the trial components.  I lateralized the size 9 a little bit more and was able to put it down a little deeper and calcar planed off of this.  We then placed the real 9 Corail femoral component with standard offset and the  real 32+1 metal hip ball and again reduced this in the acetabulum.  We were pleased with range of motion and stability and offset.  We felt like we had definitely lengthened her, but I did not feel like she would be stable if I went shorter and I did not  have any other options, either.  I think a smaller stem would have not had a good fill of the canal and I could not get the 9 any deeper without potentially fracturing bone.  We then irrigated the soft tissue with normal saline solution using pulsatile  lavage.  We closed the joint capsule with interrupted #1 Ethibond suture, followed by closing the tensor fascia with #1 Vicryl.  0 Vicryl was used to close deep tissue and 2-0 Vicryl was used to close subcutaneous tissue.  Staples were used to  reapproximate the skin.  Xeroform and Aquacel dressing was applied.  The patient was taken off the Hana table and taken to recovery room in stable condition.  All final counts being correct.  There were no complications noted.  Of note, Benita Stabile, PA-C,  assisted during the entire case and his assistance was crucial for facilitating all aspects of this case.  CN/NUANCE  D:12/08/2019 T:12/08/2019 JOB:[id number]

## 2019-12-08 NOTE — Anesthesia Procedure Notes (Signed)
Date/Time: 12/08/2019 12:15 PM Performed by: Sharlette Dense, CRNA Oxygen Delivery Method: Simple face mask

## 2019-12-08 NOTE — Discharge Instructions (Signed)

## 2019-12-08 NOTE — Interval H&P Note (Signed)
History and Physical Interval Note: The patient understands that she is here today for a left total hip replacement to address her left hip painful osteoarthritis.  There has been no acute change in her medical status.  See recent H&P.  The risk and benefits of surgery been explained in detail and informed consent was obtained.  12/08/2019 10:54 AM  Debra [name]  has presented today for surgery, with the diagnosis of osteoarthritis left hip.  The various methods of treatment have been discussed with the patient and family. After consideration of risks, benefits and other options for treatment, the patient has consented to  Procedure(s): LEFT TOTAL HIP ARTHROPLASTY ANTERIOR APPROACH (Left) as a surgical intervention.  The patient's history has been reviewed, patient examined, no change in status, stable for surgery.  I have reviewed the patient's chart and labs.  Questions were answered to the patient's satisfaction.     Mcarthur Rossetti

## 2019-12-08 NOTE — Anesthesia Procedure Notes (Signed)
Spinal  Patient location during procedure: OR Start time: 12/08/2019 12:16 PM End time: 12/08/2019 12:20 PM Staffing Performed: resident/CRNA  Resident/CRNA: Sharlette Dense, CRNA Preanesthetic Checklist Completed: patient identified, IV checked, site marked, risks and benefits discussed, surgical consent, monitors and equipment checked, pre-op evaluation and timeout performed Spinal Block Patient position: sitting Prep: DuraPrep and site prepped and draped Patient monitoring: heart rate and continuous pulse ox Approach: midline Location: L3-4 Injection technique: single-shot Needle Needle type: Pencan  Needle gauge: 24 G Needle length: 9 cm Additional Notes Kit expiration date 03/09/2020 and lot # [id number] Clear free flow CSF, negative heme, negative paresthesia] Tolerated well, placed left lateral side down for 2 minutes, then returned to supine position

## 2019-12-08 NOTE — Care Plan (Signed)
Ortho Bundle Case Management Note  Patient Details  Name: Debra [name] MRN: [id number] Date of Birth: 12-04-1950  [facility] spoke with patient prior to her upcoming Left total hip arthroplasty with Dr. Ninfa Linden. She is an ortho bundle patient through [facility] and she is agreeable to case management. She will also be a same day discharge/ambulatory surgery. Reviewed all post-op care questions. She has a husband that will be assisting after discharge. She has a FWW at home and declines need for 3in1/BSC. No other DME needed at this time. Anticipate HHPT will be needed. [facility] made [facility] after choice provided. Provided CM number for questions/needs. Will continue to follow for all other post-op needs.                         DME Arranged:   (Patient verbalized she has FWW and declines 3in1/BSC) DME Agency:     HH Arranged:  PT [place]:  [place] [facility])  Additional Comments: Please contact me with any questions of if this plan should need to change.  Jamse Arn, RN, BSN, [employer]  [phone] 12/08/2019, 1:54 PM

## 2019-12-08 NOTE — Transfer of Care (Signed)
Immediate Anesthesia Transfer of Care Note  Patient: Debra [name]  Procedure(s) Performed: LEFT TOTAL HIP ARTHROPLASTY ANTERIOR APPROACH (Left Hip)  Patient Location: PACU  Anesthesia Type:Spinal  Level of Consciousness: awake, alert  and oriented  Airway & Oxygen Therapy: Patient Spontanous Breathing and Patient connected to face mask oxygen  Post-op Assessment: Report given to RN and Post -op Vital signs reviewed and stable  Post vital signs: Reviewed and stable  Last Vitals:  Vitals Value Taken Time  BP 110/67 12/08/19 1345  Temp    Pulse    Resp 8 12/08/19 1348  SpO2    Vitals shown include unvalidated device data.  Last Pain:  Vitals:   12/08/19 1037  TempSrc: Oral  PainSc:          Complications: No complications documented.

## 2019-12-08 NOTE — Evaluation (Signed)
Physical Therapy Evaluation Patient Details Name: Debra [name] MRN: [id number] DOB: [birth year] Today's Date: 12/08/2019   History of Present Illness  Patient is 69 y.o. female s/p Lt THA anterior approach on 12/08/19 with PMH significant for TAA, HTN, OA, CAD.  Clinical Impression  Debra [name] is a 69 y.o. female POD 0 s/p Lt THA. Patient reports independence with mobility at baseline. Patient is now limited by functional impairments (see PT problem list below) and requires min guard/supervision for transfers and gait with RW. Patient was able to ambulate ~120 feet with RW and supervision and cues for safe walker management. Patient educated on safe sequencing for stair mobility and verbalized safe guarding position for people assisting with mobility. Patient instructed in exercises to facilitate ROM and circulation. Patient will benefit from continued skilled PT interventions to address impairments and progress towards PLOF. Patient has met mobility goals at adequate level for discharge home; will continue to follow if pt continues acute stay to progress towards Mod I goals.     Follow Up Recommendations Follow surgeons recommendation for DC plan and follow-up therapies;Home health PT    Equipment Recommendations  Rolling walker with 5" wheels (delivered in pacu)    Recommendations for Other Services       Precautions / Restrictions Precautions Precautions: Fall Restrictions Weight Bearing Restrictions: No Other Position/Activity Restrictions: WBAT      Mobility  Bed Mobility Overal bed mobility: Needs Assistance Bed Mobility: Supine to Sit     Supine to sit: Supervision     General bed mobility comments: no assist required, pt needed some extra time.   Transfers Overall transfer level: Needs assistance Equipment used: Rolling walker (2 wheeled) Transfers: Sit to/from Stand Sit to Stand: Supervision;Min guard         General transfer comment:  cues for safe technique with RW, pt steady with rising.   Ambulation/Gait Ambulation/Gait assistance: Supervision;Min guard Gait Distance (Feet): 120 Feet Assistive device: Rolling walker (2 wheeled) Gait Pattern/deviations: Step-to pattern;Decreased stride length;Decreased weight shift to left Gait velocity: decr   General Gait Details: cues for safe step pattern in RW and proximity to walker. No overt LOB and pt demonstrated safe walker management throughout.  Stairs Stairs: Yes Stairs assistance: Supervision;Min guard Stair Management: One rail Right;One rail Left;Step to pattern;Forwards Number of Stairs: 6 General stair comments: cues for step sequencing, "up with good, down with bad". pt verbalized safe guarding for family to provide.  Wheelchair Mobility    Modified Rankin (Stroke Patients Only)       Balance Overall balance assessment: Needs assistance Sitting-balance support: Feet supported Sitting balance-Leahy Scale: Good     Standing balance support: During functional activity;Bilateral upper extremity supported Standing balance-Leahy Scale: Fair                               Pertinent Vitals/Pain Pain Assessment: No/denies pain    Home Living Family/patient expects to be discharged to:: Private residence Living Arrangements: Spouse/significant other Available Help at Discharge: Family Type of Home: House Home Access: Stairs to enter Entrance Stairs-Rails: None Entrance Stairs-Number of Steps: 2 Home Layout: Two level Home Equipment: Cane - single point      Prior Function Level of Independence: Independent               Hand Dominance   Dominant Hand: Right    Extremity/Trunk Assessment   Upper Extremity Assessment Upper Extremity Assessment: Overall  WFL for tasks assessed    Lower Extremity Assessment Lower Extremity Assessment: Overall WFL for tasks assessed    Cervical / Trunk Assessment Cervical / Trunk  Assessment: Normal  Communication   Communication: No difficulties  Cognition Arousal/Alertness: Awake/alert Behavior During Therapy: WFL for tasks assessed/performed Overall Cognitive Status: Within Functional Limits for tasks assessed                                        General Comments      Exercises Total Joint Exercises Ankle Circles/Pumps: AROM;Both;10 reps;Seated Quad Sets: AROM;Left;5 reps;Seated Short Arc Quad: AROM;Left;Other reps (comment);Seated (3) Heel Slides: AROM;Left;Other reps (comment);Seated (3) Hip ABduction/ADduction: AROM;Left;Other reps (comment);Seated (2) Long Arc Quad: AROM;Left;Other reps (comment);Seated (3)   Assessment/Plan    PT Assessment Patient needs continued PT services  PT Problem List Decreased strength;Decreased range of motion;Decreased activity tolerance;Decreased balance;Decreased mobility;Decreased knowledge of use of DME;Decreased safety awareness;Decreased knowledge of precautions       PT Treatment Interventions DME instruction;Gait training;Stair training;Functional mobility training;Therapeutic activities;Therapeutic exercise;Balance training;Patient/family education    PT Goals (Current goals can be found in the Care Plan section)  Acute Rehab PT Goals Patient Stated Goal: get home PT Goal Formulation: With patient Time For Goal Achievement: 12/15/19 Potential to Achieve Goals: Good    Frequency 7X/week   Barriers to discharge        Co-evaluation               AM-PAC PT "6 Clicks" Mobility  Outcome Measure Help needed turning from your back to your side while in a flat bed without using bedrails?: None Help needed moving from lying on your back to sitting on the side of a flat bed without using bedrails?: None Help needed moving to and from a bed to a chair (including a wheelchair)?: A Little Help needed standing up from a chair using your arms (e.g., wheelchair or bedside chair)?: A  Little Help needed to walk in hospital room?: A Little Help needed climbing 3-5 steps with a railing? : A Little 6 Click Score: 20    End of Session Equipment Utilized During Treatment: Gait belt Activity Tolerance: Patient tolerated treatment well Patient left: in chair;with call bell/phone within reach Nurse Communication: Mobility status PT Visit Diagnosis: Other abnormalities of gait and mobility (R26.89);Muscle weakness (generalized) (M62.81);Difficulty in walking, not elsewhere classified (R26.2)    Time: 3354-5625 PT Time Calculation (min) (ACUTE ONLY): 28 min   Charges:   PT Evaluation $PT Eval Low Complexity: 1 Low PT Treatments $Gait Training: 8-22 mins        Verner Mould, DPT Acute Rehabilitation Services  Office [phone] Pager [phone]  12/08/2019 6:38 PM

## 2019-12-08 NOTE — Brief Op Note (Signed)
12/08/2019  1:41 PM  PATIENT:  Debra [name]  69 y.o. female  PRE-OPERATIVE DIAGNOSIS:  osteoarthritis left hip  POST-OPERATIVE DIAGNOSIS:  osteoarthritis left hip  PROCEDURE:  Procedure(s): LEFT TOTAL HIP ARTHROPLASTY ANTERIOR APPROACH (Left)  SURGEON:  Surgeon(s) and Role:    Mcarthur Rossetti, MD - Primary  PHYSICIAN ASSISTANT:  Benita Stabile, PA-C  ANESTHESIA:   spinal  EBL:  150 mL   DICTATION: .Other Dictation: Dictation Number [id number]  PLAN OF CARE: Discharge to home after PACU  PATIENT DISPOSITION:  PACU - hemodynamically stable.   Delay start of Pharmacological VTE agent (>24hrs) due to surgical blood loss or risk of bleeding: no

## 2019-12-08 NOTE — Anesthesia Postprocedure Evaluation (Signed)
Anesthesia Post Note  Patient: Debra [name]  Procedure(s) Performed: LEFT TOTAL HIP ARTHROPLASTY ANTERIOR APPROACH (Left Hip)     Patient location during evaluation: PACU Anesthesia Type: Spinal Level of consciousness: oriented and awake and alert Pain management: pain level controlled Vital Signs Assessment: post-procedure vital signs reviewed and stable Respiratory status: spontaneous breathing, respiratory function stable and nonlabored ventilation Cardiovascular status: blood pressure returned to baseline and stable Postop Assessment: no headache, no backache, no apparent nausea or vomiting, patient able to bend at knees and spinal receding Anesthetic complications: no   No complications documented.  Last Vitals:  Vitals:   12/08/19 1415 12/08/19 1430  BP: (!) 133/56 132/70  Pulse: 72 76  Resp: 19 20  Temp:    SpO2: 100% 94%    Last Pain:  Vitals:   12/08/19 1430  TempSrc:   PainSc: 0-No pain                 [name] A.

## 2019-12-09 DIAGNOSIS — Z471 Aftercare following joint replacement surgery: Secondary | ICD-10-CM | POA: Diagnosis not present

## 2019-12-09 DIAGNOSIS — D509 Iron deficiency anemia, unspecified: Secondary | ICD-10-CM | POA: Diagnosis not present

## 2019-12-09 DIAGNOSIS — R32 Unspecified urinary incontinence: Secondary | ICD-10-CM | POA: Diagnosis not present

## 2019-12-09 DIAGNOSIS — I051 Rheumatic mitral insufficiency: Secondary | ICD-10-CM | POA: Diagnosis not present

## 2019-12-09 DIAGNOSIS — I712 Thoracic aortic aneurysm, without rupture: Secondary | ICD-10-CM | POA: Diagnosis not present

## 2019-12-09 DIAGNOSIS — I10 Essential (primary) hypertension: Secondary | ICD-10-CM | POA: Diagnosis not present

## 2019-12-09 DIAGNOSIS — Q231 Congenital insufficiency of aortic valve: Secondary | ICD-10-CM | POA: Diagnosis not present

## 2019-12-09 DIAGNOSIS — I2510 Atherosclerotic heart disease of native coronary artery without angina pectoris: Secondary | ICD-10-CM | POA: Diagnosis not present

## 2019-12-09 DIAGNOSIS — E7800 Pure hypercholesterolemia, unspecified: Secondary | ICD-10-CM | POA: Diagnosis not present

## 2019-12-11 ENCOUNTER — Encounter (HOSPITAL_COMMUNITY): Payer: Self-pay | Admitting: Orthopaedic Surgery

## 2019-12-11 ENCOUNTER — Telehealth: Payer: Self-pay | Admitting: *Deleted

## 2019-12-11 ENCOUNTER — Telehealth: Payer: Self-pay

## 2019-12-11 ENCOUNTER — Other Ambulatory Visit: Payer: Self-pay | Admitting: Orthopaedic Surgery

## 2019-12-11 MED ORDER — PROMETHAZINE HCL 12.5 MG PO TABS: 12.5000 mg | ORAL_TABLET | Freq: Four times a day (QID) | ORAL | 0 refills | Status: DC | PRN

## 2019-12-11 NOTE — Telephone Encounter (Signed)
Patient's husband Clair Gulling called stating that patient is continuing to be nauseous and vomit and that the Rx for Zofran is not helping.  Patient had left hip surgery on 12/08/2019 and is doing well with her hip.  Cb# [phone].  Please advise.  Thank you.

## 2019-12-11 NOTE — Telephone Encounter (Signed)
I just sent in some Phenergan to try for the nausea and vomiting.

## 2019-12-11 NOTE — Telephone Encounter (Signed)
Husband asking if we can call in the suppository aswell just incase

## 2019-12-11 NOTE — Telephone Encounter (Signed)
I can send in phenergan suppository if the oral doesn't work.  They should just let me know.

## 2019-12-11 NOTE — Telephone Encounter (Signed)
Ortho bundle discharge call.

## 2019-12-11 NOTE — Telephone Encounter (Signed)
Patient's husband Clair Gulling called back wanting to know would there be something else that is not oral that the patient could do?  Stated that she will try the new Rx, but wanted to know if this Rx doesn't work, what would be the next option?  CB# [phone].  Please advise.  Thank you.

## 2019-12-11 NOTE — Telephone Encounter (Signed)
She would need this called in.  This will be a Phenergan suppository 12.5 mg per rectum every 6 hours as needed nausea and vomiting.  #10 with 1 refill.

## 2019-12-12 ENCOUNTER — Telehealth: Payer: Self-pay | Admitting: *Deleted

## 2019-12-12 DIAGNOSIS — Z471 Aftercare following joint replacement surgery: Secondary | ICD-10-CM | POA: Diagnosis not present

## 2019-12-12 DIAGNOSIS — E7800 Pure hypercholesterolemia, unspecified: Secondary | ICD-10-CM | POA: Diagnosis not present

## 2019-12-12 DIAGNOSIS — Q231 Congenital insufficiency of aortic valve: Secondary | ICD-10-CM | POA: Diagnosis not present

## 2019-12-12 DIAGNOSIS — D509 Iron deficiency anemia, unspecified: Secondary | ICD-10-CM | POA: Diagnosis not present

## 2019-12-12 DIAGNOSIS — I051 Rheumatic mitral insufficiency: Secondary | ICD-10-CM | POA: Diagnosis not present

## 2019-12-12 DIAGNOSIS — I712 Thoracic aortic aneurysm, without rupture: Secondary | ICD-10-CM | POA: Diagnosis not present

## 2019-12-12 DIAGNOSIS — R32 Unspecified urinary incontinence: Secondary | ICD-10-CM | POA: Diagnosis not present

## 2019-12-12 DIAGNOSIS — I2510 Atherosclerotic heart disease of native coronary artery without angina pectoris: Secondary | ICD-10-CM | POA: Diagnosis not present

## 2019-12-12 DIAGNOSIS — I10 Essential (primary) hypertension: Secondary | ICD-10-CM | POA: Diagnosis not present

## 2019-12-12 NOTE — Telephone Encounter (Signed)
Ortho bundle extra call completed to check on status. See flowsheet for details.

## 2019-12-12 NOTE — Telephone Encounter (Signed)
Called into pharmacy

## 2019-12-14 DIAGNOSIS — Q231 Congenital insufficiency of aortic valve: Secondary | ICD-10-CM | POA: Diagnosis not present

## 2019-12-14 DIAGNOSIS — I10 Essential (primary) hypertension: Secondary | ICD-10-CM | POA: Diagnosis not present

## 2019-12-14 DIAGNOSIS — D509 Iron deficiency anemia, unspecified: Secondary | ICD-10-CM | POA: Diagnosis not present

## 2019-12-14 DIAGNOSIS — I2510 Atherosclerotic heart disease of native coronary artery without angina pectoris: Secondary | ICD-10-CM | POA: Diagnosis not present

## 2019-12-14 DIAGNOSIS — I051 Rheumatic mitral insufficiency: Secondary | ICD-10-CM | POA: Diagnosis not present

## 2019-12-14 DIAGNOSIS — I712 Thoracic aortic aneurysm, without rupture: Secondary | ICD-10-CM | POA: Diagnosis not present

## 2019-12-14 DIAGNOSIS — E7800 Pure hypercholesterolemia, unspecified: Secondary | ICD-10-CM | POA: Diagnosis not present

## 2019-12-14 DIAGNOSIS — Z471 Aftercare following joint replacement surgery: Secondary | ICD-10-CM | POA: Diagnosis not present

## 2019-12-14 DIAGNOSIS — R32 Unspecified urinary incontinence: Secondary | ICD-10-CM | POA: Diagnosis not present

## 2019-12-15 ENCOUNTER — Telehealth: Payer: Self-pay | Admitting: *Deleted

## 2019-12-15 NOTE — Telephone Encounter (Signed)
Attempted 7 day Ortho bundle call to patient. Left VM and requested call back.

## 2019-12-18 ENCOUNTER — Telehealth: Payer: Self-pay | Admitting: *Deleted

## 2019-12-18 NOTE — Care Plan (Signed)
RNCM call to patient this morning for 1 week post-op call. She verbalized she was doing extremely well. Answered questions related to activity, compression hose, swelling, ice, and therapy. She was concerned with the notable leg length discrepancy that Dr. Ninfa Linden refers to in his operative note and discussion with her related to this. Reviewed that the PTA with [facility] is coming today and she and I both felt that she may need the actual PT to come and go over some things related to future physical function and exercises. Spoke with [facility] liaison, who agrees that PTA today should be canceled and will discuss with lead PT on when they can go out and see her to allow for plenty of questions/answers. Appointment this week with MD. No other concerns at this time.

## 2019-12-18 NOTE — Telephone Encounter (Signed)
Ortho bundle 7 day call completed. 

## 2019-12-20 DIAGNOSIS — Q231 Congenital insufficiency of aortic valve: Secondary | ICD-10-CM | POA: Diagnosis not present

## 2019-12-20 DIAGNOSIS — I10 Essential (primary) hypertension: Secondary | ICD-10-CM | POA: Diagnosis not present

## 2019-12-20 DIAGNOSIS — Z471 Aftercare following joint replacement surgery: Secondary | ICD-10-CM | POA: Diagnosis not present

## 2019-12-20 DIAGNOSIS — R32 Unspecified urinary incontinence: Secondary | ICD-10-CM | POA: Diagnosis not present

## 2019-12-20 DIAGNOSIS — D509 Iron deficiency anemia, unspecified: Secondary | ICD-10-CM | POA: Diagnosis not present

## 2019-12-20 DIAGNOSIS — E7800 Pure hypercholesterolemia, unspecified: Secondary | ICD-10-CM | POA: Diagnosis not present

## 2019-12-20 DIAGNOSIS — I2510 Atherosclerotic heart disease of native coronary artery without angina pectoris: Secondary | ICD-10-CM | POA: Diagnosis not present

## 2019-12-20 DIAGNOSIS — I051 Rheumatic mitral insufficiency: Secondary | ICD-10-CM | POA: Diagnosis not present

## 2019-12-20 DIAGNOSIS — I712 Thoracic aortic aneurysm, without rupture: Secondary | ICD-10-CM | POA: Diagnosis not present

## 2019-12-21 ENCOUNTER — Telehealth: Payer: Self-pay | Admitting: *Deleted

## 2019-12-21 ENCOUNTER — Ambulatory Visit (INDEPENDENT_AMBULATORY_CARE_PROVIDER_SITE_OTHER): Payer: Medicare PPO | Admitting: Physician Assistant

## 2019-12-21 ENCOUNTER — Encounter: Payer: Self-pay | Admitting: Physician Assistant

## 2019-12-21 DIAGNOSIS — Z96642 Presence of left artificial hip joint: Secondary | ICD-10-CM

## 2019-12-21 DIAGNOSIS — M2170 Unequal limb length (acquired), unspecified site: Secondary | ICD-10-CM

## 2019-12-21 NOTE — Telephone Encounter (Signed)
Ortho bundle 14 day in office visit completed. 

## 2019-12-21 NOTE — Progress Notes (Signed)
HPI: Debra [name] returns today 2-week status post left total hip arthroplasty.  She states she is overall doing well in regards to the hip and having no pain in the hip.  Her main complaint is the leg length discrepancy.  She is doing physical therapy at home and feels this overall is going well.  No fevers chills shortness of breath chest pain.  Physical exam: Left hip positive seroma this is aspirated 30 cc of serosanguineous fluid obtained patient tolerates well.  Surgical incisions well approximated with staples no signs of infection or wound dehiscence.  She has a definite leg length discrepancy with the left leg being longer than the right.  Left calf supple nontender.  Dorsiflexion plantarflexion left ankle intact.  Impression: Status post left total hip arthroplasty 12/08/2019  Leg length discrepancy  Plan: Staples removed Steri-Strips applied scar tissue mobilization encouraged.  Should go back on aspirin that she was taking prior to surgery.  In regards to leg length discrepancy we discussed with her that the smallest implants available that allows for stability of the hip.  Therefore she is given a prescription for a insert for her right shoe.  Questions were encouraged and answered at length by Dr. Ninfa Linden and myself.

## 2019-12-29 ENCOUNTER — Telehealth: Payer: Self-pay | Admitting: *Deleted

## 2019-12-29 NOTE — Telephone Encounter (Signed)
Ortho bundle call . 

## 2020-01-02 ENCOUNTER — Other Ambulatory Visit: Payer: Self-pay

## 2020-01-02 ENCOUNTER — Ambulatory Visit (HOSPITAL_COMMUNITY): Payer: Medicare PPO | Attending: Cardiovascular Disease

## 2020-01-02 DIAGNOSIS — I2510 Atherosclerotic heart disease of native coronary artery without angina pectoris: Secondary | ICD-10-CM | POA: Insufficient documentation

## 2020-01-02 DIAGNOSIS — I7121 Aneurysm of the ascending aorta, without rupture: Secondary | ICD-10-CM

## 2020-01-02 DIAGNOSIS — I712 Thoracic aortic aneurysm, without rupture: Secondary | ICD-10-CM | POA: Diagnosis not present

## 2020-01-02 DIAGNOSIS — I350 Nonrheumatic aortic (valve) stenosis: Secondary | ICD-10-CM | POA: Insufficient documentation

## 2020-01-02 DIAGNOSIS — Q231 Congenital insufficiency of aortic valve: Secondary | ICD-10-CM | POA: Diagnosis not present

## 2020-01-02 LAB — ECHOCARDIOGRAM COMPLETE
AR max vel: 0.79 cm2
AV Area VTI: 0.75 cm2
AV Area mean vel: 0.77 cm2
AV Mean grad: 14.2 mmHg
AV Peak grad: 22.7 mmHg
Ao pk vel: 2.38 m/s
Area-P 1/2: 3.42 cm2
S' Lateral: 2.7 cm

## 2020-01-03 ENCOUNTER — Telehealth: Payer: Self-pay

## 2020-01-03 NOTE — Telephone Encounter (Signed)
-----   Message from Tommie Raymond, NP sent at 01/03/2020 11:57 AM EDT ----- Please let the patient know that her echocardiogram looks stable with no change. Her squeeze function is 55-60%. There is mild aortic stenosis so we will just follow this. Her ascending aorta is measuring 51mm which seems stable from last assessment

## 2020-01-03 NOTE — Telephone Encounter (Signed)
The patient has been notified of the result and verbalized understanding.  All questions (if any) were answered. Wilma Flavin, RN 01/03/2020 12:24 PM

## 2020-01-08 ENCOUNTER — Encounter: Payer: Self-pay | Admitting: Orthopaedic Surgery

## 2020-01-08 ENCOUNTER — Telehealth: Payer: Self-pay | Admitting: *Deleted

## 2020-01-08 ENCOUNTER — Ambulatory Visit (INDEPENDENT_AMBULATORY_CARE_PROVIDER_SITE_OTHER): Payer: Medicare PPO | Admitting: Orthopaedic Surgery

## 2020-01-08 DIAGNOSIS — Z96642 Presence of left artificial hip joint: Secondary | ICD-10-CM

## 2020-01-08 NOTE — Telephone Encounter (Signed)
30 day Ortho bundle call to patient; she called me and states her seroma is getting larger just over last day or two and really causing her to have difficulty walking due to tightness; appointment today with you at 2:30 pm. Thanks.

## 2020-01-08 NOTE — Telephone Encounter (Signed)
Ortho bundle 30 day call completed. °

## 2020-01-08 NOTE — Progress Notes (Signed)
HPI: Debra [name] comes in today feeling as if the lump on the left side of her hip is becoming larger.  She has some discomfort lateral aspect hip and walking.  She is questioning if she has recurrent seroma.  Physical exam Left hip: Surgical incisions well-healed.  No signs of recurrent seroma no signs of infection.  No wound dehiscence.  Impression: Status post left total hip arthroplasty 12/08/2019  Plan: She will work on scar tissue mobilization.  Follow-up with [place] in 6 weeks for reexamination.  Questions were encouraged and answered at length today.

## 2020-01-09 ENCOUNTER — Other Ambulatory Visit: Payer: Self-pay

## 2020-01-09 ENCOUNTER — Inpatient Hospital Stay: Payer: Medicare PPO | Attending: Oncology | Admitting: Oncology

## 2020-01-09 ENCOUNTER — Inpatient Hospital Stay: Payer: Medicare PPO

## 2020-01-09 VITALS — BP 147/81 | HR 63 | Temp 97.3°F | Resp 17 | Ht 62.0 in | Wt 120.9 lb

## 2020-01-09 DIAGNOSIS — C7B8 Other secondary neuroendocrine tumors: Secondary | ICD-10-CM | POA: Insufficient documentation

## 2020-01-09 DIAGNOSIS — R197 Diarrhea, unspecified: Secondary | ICD-10-CM | POA: Insufficient documentation

## 2020-01-09 DIAGNOSIS — M25559 Pain in unspecified hip: Secondary | ICD-10-CM | POA: Diagnosis not present

## 2020-01-09 DIAGNOSIS — I712 Thoracic aortic aneurysm, without rupture: Secondary | ICD-10-CM | POA: Diagnosis not present

## 2020-01-09 DIAGNOSIS — C7A019 Malignant carcinoid tumor of the small intestine, unspecified portion: Secondary | ICD-10-CM

## 2020-01-09 DIAGNOSIS — Q231 Congenital insufficiency of aortic valve: Secondary | ICD-10-CM | POA: Diagnosis not present

## 2020-01-09 NOTE — Progress Notes (Signed)
  [place] OFFICE PROGRESS NOTE   Diagnosis: Carcinoid tumor  INTERVAL HISTORY:   Debra [name] returns as scheduled.  She underwent a left total hip arthroplasty on 12/08/2019.  She reports improvement in hip pain.  She is ambulating. She continues to have intermittent episodes of diarrhea.  The pattern has not changed since undergoing small bowel surgery.  No consistent diarrhea, fever, flushing, abdominal pain, or nausea. She has received the COVID-19 and influenza vaccines. Objective:  Vital signs in last 24 hours:  Blood pressure (!) 147/81, pulse 63, temperature (!) 97.3 F (36.3 C), temperature source Tympanic, resp. rate 17, height 5\' 2"  (1.575 m), weight 120 lb 14.4 oz (54.8 kg), SpO2 100 %.    Lymphatics: No cervical, supraclavicular, axillary, or inguinal nodes Resp: Lungs clear bilaterally Cardio: Regular rate and rhythm GI: No hepatosplenomegaly, no mass, nontender Vascular: No leg edema  Lab Results:  Lab Results  Component Value Date   WBC 6.2 11/29/2019   HGB 12.3 11/29/2019   HCT 38.2 11/29/2019   MCV 88.4 11/29/2019   PLT 199 11/29/2019   NEUTROABS 4.5 05/13/2015    CMP  Lab Results  Component Value Date   NA 142 11/29/2019   K 4.4 11/29/2019   CL 106 11/29/2019   CO2 26 11/29/2019   GLUCOSE 102 (H) 11/29/2019   BUN 15 11/29/2019   CREATININE 0.51 11/29/2019   CALCIUM 8.9 11/29/2019   PROT 5.9 (L) 01/03/2018   ALBUMIN 4.3 01/03/2018   AST 16 01/03/2018   ALT 14 03/01/2019   ALKPHOS 112 01/03/2018   BILITOT 1.4 (H) 01/03/2018   GFRNONAA >60 11/29/2019   GFRAA >60 11/29/2019    Medications: I have reviewed the patient's current medications.   Assessment/Plan: 1. Carcinoid tumors involving the the ileum, status post a small bowel resection 05/14/2014 with 1.4 cm and 1.1 cm tumors noted,T4Nx ? The larger tumor showed focal involvement of the serosal surface, resection margins negative, lymphovascular invasion  present ? Persistent abdominal pain and nausea, CT showed evidence of a distal small bowel obstruction 10/03/2014 with an octreotide scan 10/17/2014 confirming positive uptake in a mesenteric lymph node ? Small bowel resection 11/15/2014 confirmed multiple small bowel neuroendocrine tumors, grade 2, with metastatic tumor involving 4 lymph nodes  2. History of intermittent muscle pain and nausea/vomiting secondary to bowel obstruction from #1  3. 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 Thoracic Aortic aneurysm  5. 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  We will follow up on the chromogranin a level from today.  We discussed the indication for surveillance imaging.  She remains comfortable with clinical follow-up and not undergoing surveillance imaging.  Debra [name] will return for an office visit and chromogranin a level in 1 year.  Betsy Coder, MD  01/09/2020  3:20 PM

## 2020-01-10 LAB — CHROMOGRANIN A: Chromogranin A (ng/mL): 81.5 ng/mL (ref 0.0–101.8)

## 2020-01-16 ENCOUNTER — Ambulatory Visit: Payer: Medicare PPO | Admitting: Orthopaedic Surgery

## 2020-01-18 ENCOUNTER — Telehealth: Payer: Self-pay | Admitting: Cardiology

## 2020-01-18 NOTE — Telephone Encounter (Signed)
Left message for patient to call back  

## 2020-01-18 NOTE — Telephone Encounter (Signed)
Agree with recommendations.  

## 2020-01-18 NOTE — Telephone Encounter (Signed)
    STAT if patient feels like he/she is going to faint   1) Are you dizzy now? No  2) Do you feel faint or have you passed out? No  3) Do you have any other symptoms?   4) Have you checked your HR and BP (record if available)? 126/70  Pt is calling, she said her dizziness is back, she described it as Waive of dizziness, she said it was on and off yesterday, she felt so dizzy that she feels nauseated. She would like to know what she can do

## 2020-01-18 NOTE — Telephone Encounter (Signed)
Patient is returning call.  °

## 2020-01-18 NOTE — Telephone Encounter (Signed)
Spoke with the patient who states that yesterday she has some lightheadedness that was on and off. She states it was similar to episodes that she had last year. She was started on losartan due to high BP during these episodes. She has not had any episodes since then until yesterday. She states that she was also nauseous. She denies any SOB, chest pain, palpitations.  She states that today she is feeling much better. Only slightly dizzy earlier this morning. Her BP was 126/70 this AM. She states that she does not feel like she is going to fall or pass out during these episodes.  Encouraged patient to make sure she is eating and staying hydrated and to change positions slowly. She will continue to monitor BP and let us know if it abnormal. Advised to call back if symptoms return. Patient verbalized understanding.

## 2020-01-22 ENCOUNTER — Telehealth: Payer: Self-pay | Admitting: *Deleted

## 2020-01-22 ENCOUNTER — Telehealth: Payer: Self-pay | Admitting: Oncology

## 2020-01-22 DIAGNOSIS — C7A019 Malignant carcinoid tumor of the small intestine, unspecified portion: Secondary | ICD-10-CM

## 2020-01-22 NOTE — Telephone Encounter (Signed)
Recent chromogranin test is higher at 81.5 with last year 52.8. She is asking if this means she is no longer in remission? What does the increase mean and what should be done? Confirmed she does not take any PPIs.

## 2020-01-22 NOTE — Telephone Encounter (Signed)
Scheduled appt per 11/15 schmsg - unable to reach pt . Left message for patient with appt date and time

## 2020-01-22 NOTE — Telephone Encounter (Signed)
Per Dr. Benay Spice: is in high end of normal, most likely means nothing. Can repeat in 6 months, and if higher will investigate further. She agrees to this plan. Orders for lab placed and scheduling message sent.

## 2020-02-12 ENCOUNTER — Telehealth: Payer: Self-pay | Admitting: Family Medicine

## 2020-02-12 NOTE — Telephone Encounter (Signed)
Left message for patient to call back and schedule Medicare Annual Wellness Visit (AWV) either virtually or in office.  NO HX; please schedule at anytime with [facility] Nurse Health Advisor 2.  This should be a 45 minute visit.  AWV-I PER [place] AS OF 01/08/15

## 2020-02-15 ENCOUNTER — Other Ambulatory Visit: Payer: Self-pay | Admitting: Cardiology

## 2020-02-19 ENCOUNTER — Encounter: Payer: Self-pay | Admitting: Orthopaedic Surgery

## 2020-02-19 ENCOUNTER — Ambulatory Visit: Payer: Medicare PPO | Admitting: Orthopaedic Surgery

## 2020-02-19 DIAGNOSIS — Z96642 Presence of left artificial hip joint: Secondary | ICD-10-CM

## 2020-02-19 NOTE — Progress Notes (Signed)
The patient is now about 10 weeks status post a left total hip arthroplasty.  She is doing very well.  Her only issue is her leg length discrepancy.  She understands we will put in a more stable hip that we could not with what we had in terms of the smallest products.  That is not really a big issue to her either.  She is doing great with her balance and range of motion as well.  On exam her range of motion is excellent.  Her incision looks great.  There is some mild swelling but no seroma.  She will continue to increase her activities as comfort allows.  I showed her hip model explained in detail how we do the surgeries.  I would like to see her back in 6 months with a standing AP pelvis and lateral of the left operative hip.  If there is any issues before then she will let us know.

## 2020-02-20 DIAGNOSIS — H5213 Myopia, bilateral: Secondary | ICD-10-CM | POA: Diagnosis not present

## 2020-02-20 DIAGNOSIS — H2511 Age-related nuclear cataract, right eye: Secondary | ICD-10-CM | POA: Diagnosis not present

## 2020-02-22 ENCOUNTER — Other Ambulatory Visit: Payer: Self-pay | Admitting: Cardiology

## 2020-03-07 ENCOUNTER — Telehealth: Payer: Self-pay | Admitting: *Deleted

## 2020-03-07 NOTE — Telephone Encounter (Signed)
90 day Ortho bundle call completed. 

## 2020-03-12 ENCOUNTER — Ambulatory Visit: Payer: Medicare PPO

## 2020-03-12 ENCOUNTER — Other Ambulatory Visit: Payer: Self-pay | Admitting: *Deleted

## 2020-03-12 ENCOUNTER — Ambulatory Visit: Payer: Medicare PPO | Admitting: Cardiology

## 2020-03-12 DIAGNOSIS — I712 Thoracic aortic aneurysm, without rupture, unspecified: Secondary | ICD-10-CM

## 2020-03-26 ENCOUNTER — Ambulatory Visit: Payer: Medicare PPO

## 2020-04-15 ENCOUNTER — Inpatient Hospital Stay: Admission: RE | Admit: 2020-04-15 | Payer: Medicare PPO | Source: Ambulatory Visit

## 2020-04-15 ENCOUNTER — Encounter: Payer: Medicare PPO | Admitting: Cardiothoracic Surgery

## 2020-04-15 DIAGNOSIS — S0501XA Injury of conjunctiva and corneal abrasion without foreign body, right eye, initial encounter: Secondary | ICD-10-CM | POA: Diagnosis not present

## 2020-04-16 ENCOUNTER — Ambulatory Visit: Payer: Medicare PPO

## 2020-04-27 ENCOUNTER — Other Ambulatory Visit: Payer: Self-pay | Admitting: Cardiology

## 2020-05-08 ENCOUNTER — Telehealth: Payer: Self-pay | Admitting: Family Medicine

## 2020-05-08 NOTE — Telephone Encounter (Signed)
Left message for patient to call back and schedule Medicare Annual Wellness Visit (AWV) either virtually or in office. No detailed message left   AWVI  please schedule at anytime with [facility] Nurse Health Advisor 1 or 2   This should be a 45 minute visit. 

## 2020-06-11 NOTE — Progress Notes (Signed)
Subjective:   Mazella Deen is a 70 y.o. female who presents for Medicare Annual (Subsequent) preventive examination.  Review of Systems    N/A Cardiac Risk Factors include: advanced age (>49men, >63 women);hypertension;dyslipidemia     Objective:    Today's Vitals   06/12/20 1038  BP: 122/72  Pulse: 72  Temp: 99 F (37.2 C)  SpO2: 99%  Weight: 127 lb (57.6 kg)   Body mass index is 23.23 kg/m.  Advanced Directives 06/12/2020 01/09/2020 11/29/2019 07/30/2016 12/02/2015 03/29/2015 12/24/2014  Does Patient Have a Medical Advance Directive? Yes Yes Yes Yes Yes Yes Yes  Type of Advance Directive - [place];Living will [place];Living will [place];Living will [place];Living will [place];Living will [place];Living will  Does patient want to make changes to medical advance directive? No - Patient declined No - Patient declined - - - No - Patient declined -  Copy of [place] in Chart? - Yes - validated most recent copy scanned in chart (See row information) - Yes Yes No - copy requested No - copy requested  Pre-existing out of facility DNR order (yellow form or pink MOST form) - - - - - - -    Current Medications (verified) Outpatient Encounter Medications as of 06/12/2020  Medication Sig  . aspirin (ASPIRIN 81) 81 MG chewable tablet Chew 1 tablet (81 mg total) by mouth 2 (two) times daily after a meal. (Patient taking differently: Chew 81 mg by mouth daily.)  . clindamycin (CLEOCIN) 150 MG capsule Take 600 mg by mouth See admin instructions. Take 600 mg by mouth prior to dental appointments  . losartan (COZAAR) 25 MG tablet TAKE 1 TABLET BY MOUTH EVERY DAY  . metoprolol succinate (TOPROL-XL) 50 MG 24 hr tablet TAKE 1 TABLET BY MOUTH EVERY DAY  . simvastatin (ZOCOR) 20 MG tablet TAKE 1 TABLET (20 MG TOTAL) BY MOUTH DAILY AT 6 PM.   No  facility-administered encounter medications on file as of 06/12/2020.    Allergies (verified) Codeine, Opium, Oxycodone, and Penicillins   History: Past Medical History:  Diagnosis Date  . Aortic aneurysm ([place])   . Arthritis    "moderate in back" (11/15/2014)  . Bicuspid aortic valve    mild AS by echo 12/2016  . 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 Colon cancer ([place])    NEUROENDOCRINE   . Coronary artery disease    nonobstructive  . H/O echocardiogram    bicuspid aortic valve with moderate aortic valve sclerosis,mild PR.TR.MR and mildly dilated aorta by echo on 8.2012  . Heart murmur   . History of kidney stones   . Hypertension   . Iron deficiency anemia    "can't tolerate the supplements right now" (11/15/2014)  . Mild mitral regurgitation   . PONV (postoperative nausea and vomiting)    PREOP YEARS AGO WITH HYSTERECTOMY FELT " CREEPY" PRIOR TO SURGERY   . Thoracic aortic aneurysm ([facility])    mild, 4.1 cm by recent CT followed by Dr Darcey Nora   Past Surgical History:  Procedure Laterality Date  . ABDOMINAL HYSTERECTOMY    . BOWEL RESECTION N/A 11/15/2014   Procedure: SMALL BOWEL RESECTION;  Surgeon: [name], MD;  Location: [facility];  Service: General;  Laterality: N/A;  . CATARACT EXTRACTION W/ INTRAOCULAR LENS IMPLANT    . COLON SURGERY    . COLONOSCOPY  10/15/11  diverticulosis present, mild severity, no polyps, repeat in 10 years.  Marland Kitchen LAPAROSCOPIC LYSIS OF ADHESIONS N/A 05/14/2014   Procedure: LAPAROSCOPIC LYSIS OF ADHESIONS x 68mins;  Surgeon: [name], MD;  Location: [facility];  Service: General;  Laterality: N/A;  . LAPAROSCOPIC SMALL BOWEL RESECTION N/A 05/14/2014   Procedure: LAPAROSCOPIC ASSISTED SMALL BOWEL RESECTION;  Surgeon: [name], MD;  Location: [facility];  Service: General;  Laterality: N/A;  . SMALL INTESTINE SURGERY  11/15/2014  . TONSILLECTOMY    . TOTAL HIP ARTHROPLASTY Left 12/08/2019   Procedure: LEFT TOTAL HIP ARTHROPLASTY  ANTERIOR APPROACH;  Surgeon: Mcarthur Rossetti, MD;  Location: [facility];  Service: Orthopedics;  Laterality: Left;   Family History  Problem Relation Age of Onset  . CAD Maternal Grandfather   . CAD Paternal Grandmother   . Heart attack Paternal Grandmother   . CAD Paternal Grandfather   . Breast cancer Mother   . Cancer Father   . Diabetes Father   . Stroke Neg Hx   . Hypertension Neg Hx    Social History   Socioeconomic History  . Marital status: Married    Spouse name: Not on file  . Number of children: 2  . Years of education: 65  . Highest education level: Not on file  Occupational History  . Not on file  Tobacco Use  . Smoking status: Never Smoker  . Smokeless tobacco: Never Used  Vaping Use  . Vaping Use: Never used  Substance and Sexual Activity  . Alcohol use: Yes    Alcohol/week: 4.0 standard drinks    Types: 4 Glasses of wine per week    Comment: 5 GLASSES OF WINE PER WEEK   . Drug use: No  . Sexual activity: Yes  Other Topics Concern  . Not on file  Social History Narrative   Married, spouse Cynai Skeens   Social Determinants of Health   Financial Resource Strain: Low Risk   . Difficulty of Paying Living Expenses: Not hard at all  Food Insecurity: No Food Insecurity  . Worried About Charity fundraiser in the Last Year: Never true  . Ran Out of Food in the Last Year: Never true  Transportation Needs: No Transportation Needs  . Lack of Transportation (Medical): No  . Lack of Transportation (Non-Medical): No  Physical Activity: Insufficiently Active  . Days of Exercise per Week: 3 days  . Minutes of Exercise per Session: 30 min  Stress: No Stress Concern Present  . Feeling of Stress : Not at all  Social Connections: Socially Integrated  . Frequency of Communication with Friends and Family: More than three times a week  . Frequency of Social Gatherings with Friends and Family: More than three times a week  . Attends Religious Services: 1 to 4  times per year  . Active Member of Clubs or Organizations: Yes  . Attends Archivist Meetings: 1 to 4 times per year  . Marital Status: Married    Tobacco Counseling Counseling given: Not Answered   Clinical Intake:  Pre-visit preparation completed: Yes  Pain : No/denies pain     Nutritional Risks: None Diabetes: No  What is the last grade level you completed in school?: master  Diabetic?no  Interpreter Needed?: No  Information entered by :: [place] of Daily Living In your present state of health, do you have any difficulty performing the following activities: 06/12/2020 11/29/2019  Hearing? N N  Vision? N N  Difficulty  concentrating or making decisions? N N  Walking or climbing stairs? N N  Dressing or bathing? N N  Doing errands, shopping? N -  Preparing Food and eating ? N -  Using the Toilet? N -  In the past six months, have you accidently leaked urine? N -  Do you have problems with loss of bowel control? N -  Managing your Medications? N -  Managing your Finances? N -  Housekeeping or managing your Housekeeping? N -  Some recent data might be hidden    Patient Care Team: [place], Betty G, MD as PCP - General (Family Medicine) Sueanne Margarita, MD as PCP - Cardiology (Cardiology) Sueanne Margarita, MD as Consulting Physician (Cardiology)  Indicate any recent Medical Services you may have received from other than [facility] providers in the past year (date may be approximate).     Assessment:   This is a routine wellness examination for [place].  Hearing/Vision screen  Hearing Screening   125Hz  250Hz  500Hz  1000Hz  2000Hz  3000Hz  4000Hz  6000Hz  8000Hz   Right ear:           Left ear:           Vision Screening Comments: Annual eye exam wears glasses  Dietary issues and exercise activities discussed: Current Exercise Habits: Home exercise routine, Type of exercise: walking, Time (Minutes): 30, Frequency (Times/Week): 3, Weekly Exercise  (Minutes/Week): 90, Intensity: Mild, Exercise limited by: None identified  Goals    . Exercise 3x per week (30 min per time)      Depression Screen PHQ 2/9 Scores 06/12/2020 06/12/2020 07/23/2017  PHQ - 2 Score 0 0 0    Fall Risk Fall Risk  06/12/2020  Falls in the past year? 0  Number falls in past yr: 0  Injury with Fall? 0  Follow up Falls evaluation completed    FALL RISK PREVENTION PERTAINING TO THE HOME:  Any stairs in or around the home? Yes  If so, are there any without handrails? Yes  Home free of loose throw rugs in walkways, pet beds, electrical cords, etc? Yes  Adequate lighting in your home to reduce risk of falls? Yes   ASSISTIVE DEVICES UTILIZED TO PREVENT FALLS:  Life alert? No  Use of a cane, walker or w/c? Yes  Grab bars in the bathroom? Yes  Shower chair or bench in shower? Yes  Elevated toilet seat or a handicapped toilet? Yes   TIMED UP AND GO:  Was the test performed? Yes .  Length of time to ambulate 10 feet: 6 sec.   Gait steady and fast with assistive device  Cognitive Function:   Normal cognitive status assessed by direct observation by this Nurse Health Advisor. No abnormalities found.        Immunizations Immunization History  Administered Date(s) Administered  . Fluad Quad(high Dose 65+) 11/03/2018  . Influenza Split 12/16/2013  . Influenza, High Dose Seasonal PF 11/29/2016, 12/26/2017, 11/21/2019  . Influenza,inj,Quad PF,6+ Mos 12/24/2014, 12/02/2015  . Influenza-Unspecified 11/29/2016  . PFIZER(Purple Top)SARS-COV-2 Vaccination 03/28/2019, 04/18/2019, 12/03/2019  . Pneumococcal Conjugate-13 04/07/2016  . Pneumococcal Polysaccharide-23 03/10/2017  . Tdap 06/04/2017    TDAP status: Up to date  Flu Vaccine status: Up to date  Pneumococcal vaccine status: Completed during today's visit.  Covid-19 vaccine status: Completed vaccines  Qualifies for Shingles Vaccine? Yes   Zostavax completed No   Shingrix Completed?: No.     Education has been provided regarding the importance of this vaccine. Patient has been advised  to call insurance company to determine out of pocket expense if they have not yet received this vaccine. Advised may also receive vaccine at local pharmacy or Health Dept. Verbalized acceptance and understanding.  Screening Tests Health Maintenance  Topic Date Due  . Hepatitis C Screening  Never done  . DEXA SCAN  Never done  . COVID-19 Vaccine (4 - Booster for Pfizer series) 06/01/2020  . MAMMOGRAM  09/19/2020  . INFLUENZA VACCINE  10/07/2020  . COLONOSCOPY (Pts 45-24yrs Insurance coverage will need to be confirmed)  08/19/2024  . TETANUS/TDAP  06/05/2027  . PNA vac Low Risk Adult  Completed  . HPV VACCINES  Aged Out    Health Maintenance  Health Maintenance Due  Topic Date Due  . Hepatitis C Screening  Never done  . DEXA SCAN  Never done  . COVID-19 Vaccine (4 - Booster for Pfizer series) 06/01/2020    Colorectal cancer screening: Type of screening: Colonoscopy. Completed 08/20/2014. Repeat every 10 years  Mammogram status: Ordered 06/12/2020. Pt provided with contact info and advised to call to schedule appt.   Bone Density status: Ordered 06/12/2020. Pt provided with contact info and advised to call to schedule appt.  Lung Cancer Screening: (Low Dose CT Chest recommended if Age 66-80 years, 30 pack-year currently smoking OR have quit w/in 15years.) does not qualify.   Lung Cancer Screening Referral: n/A   Additional Screening:  Hepatitis C Screening: does qualify  Vision Screening: Recommended annual ophthalmology exams for early detection of glaucoma and other disorders of the eye. Is the patient up to date with their annual eye exam?  Yes  Who is the provider or what is the name of the office in which the patient attends annual eye exams? Dr.McCuen If pt is not established with a provider, would they like to be referred to a provider to establish care? Yes .   Dental  Screening: Recommended annual dental exams for proper oral hygiene  Community Resource Referral / Chronic Care Management: CRR required this visit?  No   CCM required this visit?  No      Plan:     I have personally reviewed and noted the following in the patient's chart:   . Medical and social history . Use of alcohol, tobacco or illicit drugs  . Current medications and supplements . Functional ability and status . Nutritional status . Physical activity . Advanced directives . List of other physicians . Hospitalizations, surgeries, and ER visits in previous 12 months . Vitals . Screenings to include cognitive, depression, and falls . Referrals and appointments  In addition, I have reviewed and discussed with patient certain preventive protocols, quality metrics, and best practice recommendations. A written personalized care plan for preventive services as well as general preventive health recommendations were provided to patient.     Randel Pigg, LPN   05/14/486   Nurse Notes: none

## 2020-06-12 ENCOUNTER — Ambulatory Visit (INDEPENDENT_AMBULATORY_CARE_PROVIDER_SITE_OTHER): Payer: Medicare PPO

## 2020-06-12 ENCOUNTER — Ambulatory Visit: Payer: Medicare PPO

## 2020-06-12 ENCOUNTER — Other Ambulatory Visit: Payer: Self-pay

## 2020-06-12 VITALS — BP 122/72 | HR 72 | Temp 99.0°F | Wt 127.0 lb

## 2020-06-12 DIAGNOSIS — Z1231 Encounter for screening mammogram for malignant neoplasm of breast: Secondary | ICD-10-CM

## 2020-06-12 DIAGNOSIS — Z780 Asymptomatic menopausal state: Secondary | ICD-10-CM

## 2020-06-12 DIAGNOSIS — Z0000 Encounter for general adult medical examination without abnormal findings: Secondary | ICD-10-CM

## 2020-06-12 NOTE — Patient Instructions (Signed)
[name] , Thank you for taking time to come for your Medicare Wellness Visit. I appreciate your ongoing commitment to your health goals. Please review the following plan we discussed and let me know if I can assist you in the future.   Screening recommendations/referrals: Colonoscopy: current Due 08/19/2024 Mammogram: referral placed  Bone Density: referral placed Recommended yearly ophthalmology/optometry visit for glaucoma screening and checkup Recommended yearly dental visit for hygiene and checkup  Vaccinations: Influenza vaccine: current Due Fall 2022 Pneumococcal vaccine: completed series Tdap vaccine: current due 06/05/2027 Shingles vaccine: will check local pharmacy    Advanced directives: will provide copies   Conditions/risks identified: none  Next appointment: Wednesday August 10th @900am    Preventive Care 65 Years and Older, Female Preventive care refers to lifestyle choices and visits with your health care provider that can promote health and wellness. What does preventive care include?  A yearly physical exam. This is also called an annual well check.  Dental exams once or twice a year.  Routine eye exams. Ask your health care provider how often you should have your eyes checked.  Personal lifestyle choices, including:  Daily care of your teeth and gums.  Regular physical activity.  Eating a healthy diet.  Avoiding tobacco and drug use.  Limiting alcohol use.  Practicing safe sex.  Taking low-dose aspirin every day.  Taking vitamin and mineral supplements as recommended by your health care provider. What happens during an annual well check? The services and screenings done by your health care provider during your annual well check will depend on your age, overall health, lifestyle risk factors, and family history of disease. Counseling  Your health care provider may ask you questions about your:  Alcohol use.  Tobacco use.  Drug  use.  Emotional well-being.  Home and relationship well-being.  Sexual activity.  Eating habits.  History of falls.  Memory and ability to understand (cognition).  Work and work Statistician.  Reproductive health. Screening  You may have the following tests or measurements:  Height, weight, and BMI.  Blood pressure.  Lipid and cholesterol levels. These may be checked every 5 years, or more frequently if you are over 70 years old.  Skin check.  Lung cancer screening. You may have this screening every year starting at age 34 if you have a 30-pack-year history of smoking and currently smoke or have quit within the past 15 years.  Fecal occult blood test (FOBT) of the stool. You may have this test every year starting at age 74.  Flexible sigmoidoscopy or colonoscopy. You may have a sigmoidoscopy every 5 years or a colonoscopy every 10 years starting at age 32.  Hepatitis C blood test.  Hepatitis B blood test.  Sexually transmitted disease (STD) testing.  Diabetes screening. This is done by checking your blood sugar (glucose) after you have not eaten for a while (fasting). You may have this done every 1-3 years.  Bone density scan. This is done to screen for osteoporosis. You may have this done starting at age 36.  Mammogram. This may be done every 1-2 years. Talk to your health care provider about how often you should have regular mammograms. Talk with your health care provider about your test results, treatment options, and if necessary, the need for more tests. Vaccines  Your health care provider may recommend certain vaccines, such as:  Influenza vaccine. This is recommended every year.  Tetanus, diphtheria, and acellular pertussis (Tdap, Td) vaccine. You may need a Td booster  every 10 years.  Zoster vaccine. You may need this after age 33.  Pneumococcal 13-valent conjugate (PCV13) vaccine. One dose is recommended after age 48.  Pneumococcal polysaccharide  (PPSV23) vaccine. One dose is recommended after age 44. Talk to your health care provider about which screenings and vaccines you need and how often you need them. This information is not intended to replace advice given to you by your health care provider. Make sure you discuss any questions you have with your health care provider. Document Released: 03/22/2015 Document Revised: 11/13/2015 Document Reviewed: 12/25/2014 Elsevier Interactive Patient Education  2017 [employer] Prevention in the Home Falls can cause injuries. They can happen to people of all ages. There are many things you can do to make your home safe and to help prevent falls. What can I do on the outside of my home?  Regularly fix the edges of walkways and driveways and fix any cracks.  Remove anything that might make you trip as you walk through a door, such as a raised step or threshold.  Trim any bushes or trees on the path to your home.  Use bright outdoor lighting.  Clear any walking paths of anything that might make someone trip, such as rocks or tools.  Regularly check to see if handrails are loose or broken. Make sure that both sides of any steps have handrails.  Any raised decks and porches should have guardrails on the edges.  Have any leaves, snow, or ice cleared regularly.  Use sand or salt on walking paths during winter.  Clean up any spills in your garage right away. This includes oil or grease spills. What can I do in the bathroom?  Use night lights.  Install grab bars by the toilet and in the tub and shower. Do not use towel bars as grab bars.  Use non-skid mats or decals in the tub or shower.  If you need to sit down in the shower, use a plastic, non-slip stool.  Keep the floor dry. Clean up any water that spills on the floor as soon as it happens.  Remove soap buildup in the tub or shower regularly.  Attach bath mats securely with double-sided non-slip rug tape.  Do not have  throw rugs and other things on the floor that can make you trip. What can I do in the bedroom?  Use night lights.  Make sure that you have a light by your bed that is easy to reach.  Do not use any sheets or blankets that are too big for your bed. They should not hang down onto the floor.  Have a firm chair that has side arms. You can use this for support while you get dressed.  Do not have throw rugs and other things on the floor that can make you trip. What can I do in the kitchen?  Clean up any spills right away.  Avoid walking on wet floors.  Keep items that you use a lot in easy-to-reach places.  If you need to reach something above you, use a strong step stool that has a grab bar.  Keep electrical cords out of the way.  Do not use floor polish or wax that makes floors slippery. If you must use wax, use non-skid floor wax.  Do not have throw rugs and other things on the floor that can make you trip. What can I do with my stairs?  Do not leave any items on the stairs.  Make  sure that there are handrails on both sides of the stairs and use them. Fix handrails that are broken or loose. Make sure that handrails are as long as the stairways.  Check any carpeting to make sure that it is firmly attached to the stairs. Fix any carpet that is loose or worn.  Avoid having throw rugs at the top or bottom of the stairs. If you do have throw rugs, attach them to the floor with carpet tape.  Make sure that you have a light switch at the top of the stairs and the bottom of the stairs. If you do not have them, ask someone to add them for you. What else can I do to help prevent falls?  Wear shoes that:  Do not have high heels.  Have rubber bottoms.  Are comfortable and fit you well.  Are closed at the toe. Do not wear sandals.  If you use a stepladder:  Make sure that it is fully opened. Do not climb a closed stepladder.  Make sure that both sides of the stepladder are  locked into place.  Ask someone to hold it for you, if possible.  Clearly mark and make sure that you can see:  Any grab bars or handrails.  First and last steps.  Where the edge of each step is.  Use tools that help you move around (mobility aids) if they are needed. These include:  Canes.  Walkers.  Scooters.  Crutches.  Turn on the lights when you go into a dark area. Replace any light bulbs as soon as they burn out.  Set up your furniture so you have a clear path. Avoid moving your furniture around.  If any of your floors are uneven, fix them.  If there are any pets around you, be aware of where they are.  Review your medicines with your doctor. Some medicines can make you feel dizzy. This can increase your chance of falling. Ask your doctor what other things that you can do to help prevent falls. This information is not intended to replace advice given to you by your health care provider. Make sure you discuss any questions you have with your health care provider. Document Released: 12/20/2008 Document Revised: 08/01/2015 Document Reviewed: 03/30/2014 Elsevier Interactive Patient Education  2017 [employer].

## 2020-06-13 ENCOUNTER — Encounter: Payer: Self-pay | Admitting: Family Medicine

## 2020-06-14 ENCOUNTER — Other Ambulatory Visit: Payer: Self-pay | Admitting: Family Medicine

## 2020-06-14 DIAGNOSIS — Z1231 Encounter for screening mammogram for malignant neoplasm of breast: Secondary | ICD-10-CM

## 2020-07-22 ENCOUNTER — Other Ambulatory Visit: Payer: Self-pay

## 2020-07-22 ENCOUNTER — Other Ambulatory Visit: Payer: Medicare PPO

## 2020-07-22 ENCOUNTER — Inpatient Hospital Stay: Payer: Medicare PPO | Attending: Oncology

## 2020-07-22 DIAGNOSIS — C7A019 Malignant carcinoid tumor of the small intestine, unspecified portion: Secondary | ICD-10-CM | POA: Insufficient documentation

## 2020-07-22 DIAGNOSIS — C7B01 Secondary carcinoid tumors of distant lymph nodes: Secondary | ICD-10-CM | POA: Insufficient documentation

## 2020-07-25 LAB — CHROMOGRANIN A: Chromogranin A (ng/mL): 70 ng/mL (ref 0.0–101.8)

## 2020-08-06 ENCOUNTER — Ambulatory Visit: Payer: Medicare PPO | Admitting: Orthopaedic Surgery

## 2020-08-06 ENCOUNTER — Ambulatory Visit (INDEPENDENT_AMBULATORY_CARE_PROVIDER_SITE_OTHER): Payer: Medicare PPO

## 2020-08-06 ENCOUNTER — Encounter: Payer: Self-pay | Admitting: Orthopaedic Surgery

## 2020-08-06 DIAGNOSIS — Z96642 Presence of left artificial hip joint: Secondary | ICD-10-CM

## 2020-08-06 NOTE — Progress Notes (Signed)
The patient is now 7 months status post a left total hip arthroplasty.  She states she is doing great.  She does have some pain in the groin when she first gets up occasionally and this is lessened over the last few months.  She is doing well otherwise.  There is a leg length discrepancy but she says that she is compensated for that as well with an insert.  Her left operative hip moves smoothly and fluidly.  There is no pain at all.  There is some slight swelling but no seroma.  An AP pelvis and lateral left hip shows a well-seated total hip arthroplasty with no complicating features.  At this point follow-up can be as needed for her left hip.  All questions and concerns were answered and addressed.  She does have some worsening lumbar spine issues and if this worsens for her I gave her some names to consider with seeing a neurosurgeon.

## 2020-08-13 ENCOUNTER — Ambulatory Visit: Payer: Medicare PPO | Admitting: Orthopaedic Surgery

## 2020-08-20 ENCOUNTER — Ambulatory Visit: Payer: Medicare PPO | Admitting: Orthopaedic Surgery

## 2020-09-03 ENCOUNTER — Other Ambulatory Visit: Payer: Self-pay

## 2020-09-04 ENCOUNTER — Encounter: Payer: Self-pay | Admitting: Family Medicine

## 2020-09-04 ENCOUNTER — Ambulatory Visit: Payer: Medicare PPO | Admitting: Family Medicine

## 2020-09-04 ENCOUNTER — Ambulatory Visit (INDEPENDENT_AMBULATORY_CARE_PROVIDER_SITE_OTHER)
Admission: RE | Admit: 2020-09-04 | Discharge: 2020-09-04 | Disposition: A | Discharge status: Home / Self Care | Payer: Medicare PPO | Source: Ambulatory Visit | Attending: Family Medicine | Admitting: Family Medicine

## 2020-09-04 VITALS — BP 126/70 | HR 72 | Temp 98.1°F | Resp 16 | Ht 62.0 in | Wt 128.0 lb

## 2020-09-04 DIAGNOSIS — R053 Chronic cough: Secondary | ICD-10-CM

## 2020-09-04 DIAGNOSIS — J309 Allergic rhinitis, unspecified: Secondary | ICD-10-CM | POA: Diagnosis not present

## 2020-09-04 DIAGNOSIS — R059 Cough, unspecified: Secondary | ICD-10-CM | POA: Diagnosis not present

## 2020-09-04 MED ORDER — AZITHROMYCIN 250 MG PO TABS: ORAL_TABLET | ORAL | 0 refills | Status: AC

## 2020-09-04 MED ORDER — BENZONATATE 100 MG PO CAPS: 200.0000 mg | ORAL_CAPSULE | Freq: Two times a day (BID) | ORAL | 0 refills | Status: AC | PRN

## 2020-09-04 MED ORDER — FLUTICASONE PROPIONATE 50 MCG/ACT NA SUSP: 1.0000 | Freq: Two times a day (BID) | NASAL | 3 refills | Status: DC

## 2020-09-04 NOTE — Progress Notes (Signed)
Chief Complaint  Patient presents with   Cough    Ongoing for about 3-4 weeks, notices more at night, but has it throughout the day.   HPI: Debra [name] is a pleasant 70 y.o. female with hx of malignant carcinoid tumor of small intestine, HTN,and HLD here today complaining of 3-4 weeks of non productive cough as described above. Gradual onset, it seems to be stable for the past couple weeks.  Negative for associated fever,chills,anosmia,ageusia,changes in appetite,rhinorrhea,sore throat,wheezing,SOB, burping,or heartburn. It seems to be worse at night.  Today mild fullness sensation of right ear. No sick contact or recent travel. No respiratory tract infection prior to symptoms onset. She has not noted abnormal wt loss or night sweats.  No hx of tobacco use.  She has not identified exacerbating or alleviating factors. Has not tried OTC treatments.  Prior years she has had similar cough for a few days during fall, no every year.  Review of Systems  Constitutional:  Negative for activity change, diaphoresis and fatigue.  HENT:  Positive for postnasal drip and voice change. Negative for congestion, ear pain, mouth sores, sinus pressure, sneezing and trouble swallowing.   Eyes:  Negative for discharge, redness and itching.  Cardiovascular:  Negative for chest pain, palpitations and leg swelling.  Gastrointestinal:  Negative for abdominal pain, diarrhea, nausea and vomiting.  Musculoskeletal:  Negative for gait problem and myalgias.  Skin:  Negative for rash.  Allergic/Immunologic: Negative for environmental allergies.  Neurological:  Negative for syncope, weakness and headaches.  Hematological:  Negative for adenopathy. Does not bruise/bleed easily.  Rest see pertinent positives and negatives per HPI.  Current Outpatient Medications on File Prior to Visit  Medication Sig Dispense Refill   aspirin (ASPIRIN 81) 81 MG chewable tablet Chew 1 tablet (81 mg total) by  mouth 2 (two) times daily after a meal. (Patient taking differently: Chew 81 mg by mouth daily.) 30 tablet 0   clindamycin (CLEOCIN) 150 MG capsule Take 600 mg by mouth See admin instructions. Take 600 mg by mouth prior to dental appointments     losartan (COZAAR) 25 MG tablet TAKE 1 TABLET BY MOUTH EVERY DAY 90 tablet 3   metoprolol succinate (TOPROL-XL) 50 MG 24 hr tablet TAKE 1 TABLET BY MOUTH EVERY DAY 90 tablet 2   simvastatin (ZOCOR) 20 MG tablet TAKE 1 TABLET (20 MG TOTAL) BY MOUTH DAILY AT 6 PM. 90 tablet 2   No current facility-administered medications on file prior to visit.   Past Medical History:  Diagnosis Date   Aortic aneurysm ([place])    Arthritis    "moderate in back" (11/15/2014)   Bicuspid aortic valve    mild AS by echo 12/2016   Carcinoid tumor of small intestine, malignant ([place]) dx'd 05/2014   Colon cancer ([place])    NEUROENDOCRINE    Coronary artery disease    nonobstructive   H/O echocardiogram    bicuspid aortic valve with moderate aortic valve sclerosis,mild PR.TR.MR and mildly dilated aorta by echo on 8.2012   Heart murmur    History of kidney stones    Hypertension    Iron deficiency anemia    "can't tolerate the supplements right now" (11/15/2014)   Mild mitral regurgitation    PONV (postoperative nausea and vomiting)    PREOP YEARS AGO WITH HYSTERECTOMY FELT " CREEPY" PRIOR TO SURGERY    Thoracic aortic aneurysm (HCC)    mild, 4.1 cm by recent CT followed by Dr Darcey Nora   Allergies  Allergen Reactions   Codeine Anaphylaxis and Shortness Of Breath   Opium Anaphylaxis   Oxycodone Shortness Of Breath   Penicillins Anaphylaxis and Shortness Of Breath    Social History   Socioeconomic History   Marital status: Married    Spouse name: Not on file   Number of children: 2   Years of education: 16   Highest education level: Not on file  Occupational History   Not on file  Tobacco Use   Smoking status: Never   Smokeless tobacco: Never  Vaping Use    Vaping Use: Never used  Substance and Sexual Activity   Alcohol use: Yes    Alcohol/week: 4.0 standard drinks    Types: 4 Glasses of wine per week    Comment: 5 GLASSES OF WINE PER WEEK    Drug use: No   Sexual activity: Yes  Other Topics Concern   Not on file  Social History Narrative   Married, spouse Debra [name]   Social Determinants of Health   Financial Resource Strain: Low Risk    Difficulty of Paying Living Expenses: Not hard at all  Food Insecurity: No Food Insecurity   Worried About Charity fundraiser in the Last Year: Never true   Arboriculturist in the Last Year: Never true  Transportation Needs: No Transportation Needs   Lack of Transportation (Medical): No   Lack of Transportation (Non-Medical): No  Physical Activity: Insufficiently Active   Days of Exercise per Week: 3 days   Minutes of Exercise per Session: 30 min  Stress: No Stress Concern Present   Feeling of Stress : Not at all  Social Connections: Socially Integrated   Frequency of Communication with Friends and Family: More than three times a week   Frequency of Social Gatherings with Friends and Family: More than three times a week   Attends Religious Services: 1 to 4 times per year   Active Member of [employer] or Organizations: Yes   Attends Archivist Meetings: 1 to 4 times per year   Marital Status: Married   Vitals:   09/04/20 0852  BP: 126/70  Pulse: 72  Resp: 16  Temp: 98.1 F (36.7 C)  SpO2: 98%   Body mass index is 23.41 kg/m.  Physical Exam Constitutional:      General: She is not in acute distress.    Appearance: She is well-developed. She is not ill-appearing.  HENT:     Head: Normocephalic and atraumatic.     Right Ear: Tympanic membrane, ear canal and external ear normal.     Left Ear: Tympanic membrane, ear canal and external ear normal.     Nose: No rhinorrhea.     Comments: Post nasal drainage. Mild dysphonia. Eyes:     Conjunctiva/sclera: Conjunctivae normal.   Cardiovascular:     Rate and Rhythm: Normal rate and regular rhythm.     Heart sounds: No murmur heard. Pulmonary:     Effort: Pulmonary effort is normal. No respiratory distress.     Breath sounds: Normal breath sounds. No stridor.     Comments: Non productive cough during visit. Lymphadenopathy:     Head:     Right side of head: No submandibular adenopathy.     Left side of head: No submandibular adenopathy.     Cervical: No cervical adenopathy.  Skin:    General: Skin is warm.     Findings: No erythema or rash.  Neurological:     Mental Status:  She is alert and oriented to person, place, and time.  Psychiatric:        Speech: Speech normal.     Comments: Well groomed, good eye contact.   ASSESSMENT AND PLAN:  Ms.Taejah was seen today for cough.  Diagnoses and all orders for this visit: Orders Placed This Encounter  Procedures   DG Chest 2 View   Persistent cough for 3 weeks or longer We discussed possible etiologies including allergies and GERD among some. Because it has been persistent CXR ordered. Benzonatate for symptomatic treatment. If not improved or resolved in a few weeks we can try empiric PPI trial.  CXR showed coarsened interstitial markings, so recommend abx treatment, azithromycin, Rx sent.  -     benzonatate (TESSALON) 100 MG capsule; Take 2 capsules (200 mg total) by mouth 2 (two) times daily as needed for up to 10 days.  Allergic rhinitis, unspecified seasonality, unspecified trigger Could be contributing to above problem. She agrees with trying intranasal Flonase daily for a few weeks,some side effects discussed. Zyrtec 10 mg daily.  -     fluticasone (FLONASE) 50 MCG/ACT nasal spray; Place 1 spray into both nostrils 2 (two) times daily.   Return if symptoms worsen or fail to improve.   Jenesis Suchy G. Martinique, MD  [facility]. [place] office.

## 2020-09-04 NOTE — Patient Instructions (Addendum)
A few things to remember from today's visit:  Persistent cough for 3 weeks or longer - Plan: DG Chest 2 View, benzonatate (TESSALON) 100 MG capsule  Allergic rhinitis, unspecified seasonality, unspecified trigger - Plan: fluticasone (FLONASE) 50 MCG/ACT nasal spray  If you need refills please call your pharmacy. Do not use My Chart to request refills or for acute issues that need immediate attention.   I does not seem to be concerning at this time. Because time we are going to have a chest X ray done.  Zyrtec 10 mg in the morning and Flonase nasal spray at bedtime. Benzonatate for cough as needed.  Please be sure medication list is accurate. If a new problem present, please set up appointment sooner than planned today.

## 2020-09-20 ENCOUNTER — Ambulatory Visit
Admission: RE | Admit: 2020-09-20 | Discharge: 2020-09-20 | Disposition: A | Discharge status: Home / Self Care | Payer: Medicare PPO | Source: Ambulatory Visit | Attending: Family Medicine | Admitting: Family Medicine

## 2020-09-20 ENCOUNTER — Other Ambulatory Visit: Payer: Self-pay

## 2020-09-20 DIAGNOSIS — Z1231 Encounter for screening mammogram for malignant neoplasm of breast: Secondary | ICD-10-CM | POA: Diagnosis not present

## 2020-10-16 ENCOUNTER — Ambulatory Visit: Payer: Medicare PPO | Admitting: Family Medicine

## 2020-10-16 ENCOUNTER — Other Ambulatory Visit: Payer: Self-pay

## 2020-10-16 ENCOUNTER — Encounter: Payer: Self-pay | Admitting: Family Medicine

## 2020-10-16 VITALS — BP 128/80 | HR 74 | Resp 16 | Ht 62.0 in | Wt 128.0 lb

## 2020-10-16 DIAGNOSIS — Z1159 Encounter for screening for other viral diseases: Secondary | ICD-10-CM | POA: Diagnosis not present

## 2020-10-16 DIAGNOSIS — R739 Hyperglycemia, unspecified: Secondary | ICD-10-CM | POA: Diagnosis not present

## 2020-10-16 DIAGNOSIS — I10 Essential (primary) hypertension: Secondary | ICD-10-CM

## 2020-10-16 DIAGNOSIS — I712 Thoracic aortic aneurysm, without rupture: Secondary | ICD-10-CM

## 2020-10-16 DIAGNOSIS — I7121 Aneurysm of the ascending aorta, without rupture: Secondary | ICD-10-CM

## 2020-10-16 DIAGNOSIS — M25542 Pain in joints of left hand: Secondary | ICD-10-CM

## 2020-10-16 DIAGNOSIS — Z0000 Encounter for general adult medical examination without abnormal findings: Secondary | ICD-10-CM

## 2020-10-16 DIAGNOSIS — E7800 Pure hypercholesterolemia, unspecified: Secondary | ICD-10-CM

## 2020-10-16 LAB — COMPREHENSIVE METABOLIC PANEL
ALT: 10 U/L (ref 0–35)
AST: 17 U/L (ref 0–37)
Albumin: 4.1 g/dL (ref 3.5–5.2)
Alkaline Phosphatase: 87 U/L (ref 39–117)
BUN: 15 mg/dL (ref 6–23)
CO2: 26 mEq/L (ref 19–32)
Calcium: 9.1 mg/dL (ref 8.4–10.5)
Chloride: 108 mEq/L (ref 96–112)
Creatinine, Ser: 0.62 mg/dL (ref 0.40–1.20)
GFR: 89.38 mL/min (ref >60.00)
Glucose, Bld: 91 mg/dL (ref 70–99)
Potassium: 5 mEq/L (ref 3.5–5.1)
Sodium: 143 mEq/L (ref 135–145)
Total Bilirubin: 1.8 mg/dL — ABNORMAL HIGH (ref 0.2–1.2)
Total Protein: 5.9 g/dL — ABNORMAL LOW (ref 6.0–8.3)

## 2020-10-16 LAB — LIPID PANEL
Cholesterol: 151 mg/dL (ref 0–200)
HDL: 68.7 mg/dL (ref >39.00)
LDL Cholesterol: 68 mg/dL (ref 0–99)
NonHDL: 82.52
Total CHOL/HDL Ratio: 2
Triglycerides: 75 mg/dL (ref 0.0–149.0)
VLDL: 15 mg/dL (ref 0.0–40.0)

## 2020-10-16 LAB — HEMOGLOBIN A1C: Hgb A1c MFr Bld: 6 % (ref 4.6–6.5)

## 2020-10-16 NOTE — Progress Notes (Signed)
HPI: [name] is a pleasant  70 y.o. female, who is here today for her routine physical.  Last CPE: Over a year ago. She had her AWV on 06/12/20.  Regular exercise 3 or more time per week: She does tai chi a few times per week and walks a few times per week. She is also active with chores around her house. Following a healthful diet: Yes, she is vegetarian.  She lives with her husband. Her children live close by, every Friday she has dinner with her children.  Chronic medical problems: HLD,OA, malignant carcinoid tumor s/p small bowel resection (05/2014), AS,CAD,aortic aneurysm,and HTN among some.  Immunization History  Administered Date(s) Administered   Fluad Quad(high Dose 65+) 11/03/2018   Influenza Split 12/16/2013   Influenza, High Dose Seasonal PF 11/29/2016, 12/26/2017, 11/21/2019   Influenza,inj,Quad PF,6+ Mos 12/24/2014, 12/02/2015   Influenza-Unspecified 11/29/2016   PFIZER Comirnaty(Gray Top)Covid-19 Tri-Sucrose Vaccine 06/17/2020   PFIZER(Purple Top)SARS-COV-2 Vaccination 03/28/2019, 04/18/2019, 12/03/2019   Pneumococcal Conjugate-13 04/07/2016   Pneumococcal Polysaccharide-23 03/10/2017   Tdap 06/04/2017   Mammogram: 09/20/20. Colonoscopy: 08/20/14. DEXA: 09/2011. Hep C screening: Never.  Concerns today: Joint pain,  MCP joint of left thumb. No hx of trauma. Right handed. Negative for unusual activity. She has not noted edema or erythema.  Lab Results  Component Value Date   WBC 6.2 11/29/2019   HGB 12.3 11/29/2019   HCT 38.2 11/29/2019   MCV 88.4 11/29/2019   PLT 199 11/29/2019   HTN: She is on Losartan 25 mg daily and Metoprolol succinate 50 mg daily. Tolerating medication well. CAD: She follows with Dr Radford Pax.  Lab Results  Component Value Date   CREATININE 0.51 11/29/2019   BUN 15 11/29/2019   NA 142 11/29/2019   K 4.4 11/29/2019   CL 106 11/29/2019   CO2 26 11/29/2019   HLD: She is on Simvastatin 20 mg daily. Tolerating  medications well. Component     Latest Ref Rng & Units 03/01/2019  Cholesterol, Total     100 - 199 mg/dL 170  Triglycerides     0.0 - 149.0 mg/dL 82  HDL Cholesterol     >39.00 mg/dL 78  VLDL Cholesterol Cal     5 - 40 mg/dL 15  LDL Chol Calc (NIH)     0 - 99 mg/dL 77  Total CHOL/HDL Ratio      2.2   No hx of diabetes, glucose has been slightly elevated in the past.  S/P total left hip arthroplasty.She feels like pain improved after surgery but still having some pain with certain movements and stiffness, slowly improving. She completed PT. No limitation of ADL's.  Last visit she was c/o persistent cough. She completed abx treatment, Azithromycin,which aggravated chronic diarrhea. Cough has resolved. She did not use Flonase nasal spray. She has some questions about CXR result and wonders if further imaging is needed. CXR was negative for acute cardiopulmonary disease.Coarsened interstitial markings, with no confluent airspace disease. No hx of tobacco use.  Aortic aneurysm: She has been following with Dr Prescott Gum annually, he is retiring. She wonders if this can be followed by her cardiologist, Dr Radford Pax. Last chest CTA 03/2018: Stable aneurysmal dilatation of the ascending aorta at 4.1 cm.  Review of Systems  Constitutional:  Negative for appetite change, fatigue and fever.  HENT:  Negative for hearing loss, mouth sores and sore throat.   Eyes:  Negative for redness and visual disturbance.  Respiratory:  Negative for shortness of  breath and wheezing.   Cardiovascular:  Negative for chest pain and leg swelling.  Gastrointestinal:  Negative for abdominal pain, nausea and vomiting.       No changes in bowel habits.  Endocrine: Negative for cold intolerance, heat intolerance, polydipsia, polyphagia and polyuria.  Genitourinary:  Negative for decreased urine volume, dysuria, hematuria, vaginal bleeding and vaginal discharge.  Musculoskeletal:  Positive for arthralgias. Negative  for gait problem and myalgias.  Skin:  Negative for color change and rash.  Allergic/Immunologic: Positive for environmental allergies.  Neurological:  Negative for syncope, weakness and headaches.  Hematological:  Negative for adenopathy. Does not bruise/bleed easily.  Psychiatric/Behavioral:  Negative for confusion and sleep disturbance. The patient is not nervous/anxious.   All other systems reviewed and are negative.  Current Outpatient Medications on File Prior to Visit  Medication Sig Dispense Refill   aspirin (ASPIRIN 81) 81 MG chewable tablet Chew 1 tablet (81 mg total) by mouth 2 (two) times daily after a meal. (Patient taking differently: Chew 81 mg by mouth daily.) 30 tablet 0   clindamycin (CLEOCIN) 150 MG capsule Take 600 mg by mouth See admin instructions. Take 600 mg by mouth prior to dental appointments     fluticasone (FLONASE) 50 MCG/ACT nasal spray Place 1 spray into both nostrils 2 (two) times daily. 16 g 3   losartan (COZAAR) 25 MG tablet TAKE 1 TABLET BY MOUTH EVERY DAY 90 tablet 3   metoprolol succinate (TOPROL-XL) 50 MG 24 hr tablet TAKE 1 TABLET BY MOUTH EVERY DAY 90 tablet 2   simvastatin (ZOCOR) 20 MG tablet TAKE 1 TABLET (20 MG TOTAL) BY MOUTH DAILY AT 6 PM. 90 tablet 2   No current facility-administered medications on file prior to visit.   Past Medical History:  Diagnosis Date   Aortic aneurysm ([place])    Arthritis    "moderate in back" (11/15/2014)   Bicuspid aortic valve    mild AS by echo 12/2016   Carcinoid tumor of small intestine, malignant ([place]) dx'd 05/2014   Colon cancer ([place])    NEUROENDOCRINE    Coronary artery disease    nonobstructive   H/O echocardiogram    bicuspid aortic valve with moderate aortic valve sclerosis,mild PR.TR.MR and mildly dilated aorta by echo on 8.2012   Heart murmur    History of kidney stones    Hypertension    Iron deficiency anemia    "can't tolerate the supplements right now" (11/15/2014)   Mild mitral regurgitation     PONV (postoperative nausea and vomiting)    PREOP YEARS AGO WITH HYSTERECTOMY FELT " CREEPY" PRIOR TO SURGERY    Thoracic aortic aneurysm (HCC)    mild, 4.1 cm by recent CT followed by Dr Darcey Nora   Past Surgical History:  Procedure Laterality Date   ABDOMINAL HYSTERECTOMY     BOWEL RESECTION N/A 11/15/2014   Procedure: SMALL BOWEL RESECTION;  Surgeon: [name], MD;  Location: [place];  Service: General;  Laterality: N/A;   CATARACT EXTRACTION W/ INTRAOCULAR LENS IMPLANT     COLON SURGERY     COLONOSCOPY  10/15/11   diverticulosis present, mild severity, no polyps, repeat in 10 years.   LAPAROSCOPIC LYSIS OF ADHESIONS N/A 05/14/2014   Procedure: LAPAROSCOPIC LYSIS OF ADHESIONS x 61mns;  Surgeon: BGeorganna Skeans MD;  [place]  Service: General;  Laterality: N/A;   LAPAROSCOPIC SMALL BOWEL RESECTION N/A 05/14/2014   Procedure: LAPAROSCOPIC ASSISTED SMALL BOWEL RESECTION;  Surgeon: BGeorganna Skeans MD;  [facility];  Service: General;  Laterality: N/A;   SMALL INTESTINE SURGERY  11/15/2014   TONSILLECTOMY     TOTAL HIP ARTHROPLASTY Left 12/08/2019   Procedure: LEFT TOTAL HIP ARTHROPLASTY ANTERIOR APPROACH;  Surgeon: Mcarthur Rossetti, MD;  Location: [facility];  Service: Orthopedics;  Laterality: Left;   Allergies  Allergen Reactions   Codeine Anaphylaxis and Shortness Of Breath   Opium Anaphylaxis   Oxycodone Shortness Of Breath   Penicillins Anaphylaxis and Shortness Of Breath   Family History  Problem Relation Age of Onset   CAD Maternal Grandfather    CAD Paternal Grandmother    Heart attack Paternal Grandmother    CAD Paternal Grandfather    Breast cancer Mother    Cancer Father    Diabetes Father    Stroke Neg Hx    Hypertension Neg Hx    Social History   Socioeconomic History   Marital status: Married    Spouse name: Not on file   Number of children: 2   Years of education: 17   Highest education level: Not on file  Occupational History   Not on file   Tobacco Use   Smoking status: Never   Smokeless tobacco: Never  Vaping Use   Vaping Use: Never used  Substance and Sexual Activity   Alcohol use: Yes    Alcohol/week: 4.0 standard drinks    Types: 4 Glasses of wine per week    Comment: 5 GLASSES OF WINE PER WEEK    Drug use: No   Sexual activity: Yes  Other Topics Concern   Not on file  Social History Narrative   Married, spouse Jadae Steinke   Social Determinants of Health   Financial Resource Strain: Low Risk    Difficulty of Paying Living Expenses: Not hard at all  Food Insecurity: No Food Insecurity   Worried About Charity fundraiser in the Last Year: Never true   Arboriculturist in the Last Year: Never true  Transportation Needs: No Transportation Needs   Lack of Transportation (Medical): No   Lack of Transportation (Non-Medical): No  Physical Activity: Insufficiently Active   Days of Exercise per Week: 3 days   Minutes of Exercise per Session: 30 min  Stress: No Stress Concern Present   Feeling of Stress : Not at all  Social Connections: Socially Integrated   Frequency of Communication with Friends and Family: More than three times a week   Frequency of Social Gatherings with Friends and Family: More than three times a week   Attends Religious Services: 1 to 4 times per year   Active Member of Clubs or Organizations: Yes   Attends Archivist Meetings: 1 to 4 times per year   Marital Status: Married   Vitals:   10/16/20 0853  BP: 128/80  Pulse: 74  Resp: 16  SpO2: 93%   Body mass index is 23.41 kg/m.  Wt Readings from Last 3 Encounters:  10/16/20 128 lb (58.1 kg)  09/04/20 128 lb (58.1 kg)  06/12/20 127 lb (57.6 kg)   Physical Exam Vitals and nursing note reviewed.  Constitutional:      General: She is not in acute distress.    Appearance: She is well-developed and normal weight.  HENT:     Head: Normocephalic and atraumatic.     Right Ear: Hearing, tympanic membrane, ear canal and  external ear normal.     Left Ear: Hearing, tympanic membrane, ear canal and external ear normal.  Mouth/Throat:     Mouth: Mucous membranes are moist.     Pharynx: Oropharynx is clear. Uvula midline.  Eyes:     Extraocular Movements: Extraocular movements intact.     Conjunctiva/sclera: Conjunctivae normal.     Pupils: Pupils are equal, round, and reactive to light.  Neck:     Thyroid: No thyroid mass.     Trachea: No tracheal deviation.  Cardiovascular:     Rate and Rhythm: Normal rate and regular rhythm.     Pulses:          Dorsalis pedis pulses are 2+ on the right side and 2+ on the left side.     Heart sounds: No murmur heard. Pulmonary:     Effort: Pulmonary effort is normal. No respiratory distress.     Breath sounds: Normal breath sounds.  Abdominal:     Palpations: Abdomen is soft. There is no hepatomegaly or mass.     Tenderness: There is no abdominal tenderness.  Genitourinary:    Comments: No concern. Musculoskeletal:     Comments: Hand: Heberden's node and Bouchard's nodes IP, bilateral.. Left hip mild limitation of rotation, no pain elicited.  Lymphadenopathy:     Cervical: No cervical adenopathy.  Skin:    General: Skin is warm.     Findings: No erythema or rash.  Neurological:     General: No focal deficit present.     Mental Status: She is alert and oriented to person, place, and time.     Cranial Nerves: No cranial nerve deficit.     Coordination: Coordination normal.     Gait: Gait normal.     Deep Tendon Reflexes:     Reflex Scores:      Bicep reflexes are 2+ on the right side and 2+ on the left side.      Patellar reflexes are 2+ on the right side and 2+ on the left side. Psychiatric:        Speech: Speech normal.     Comments: Well groomed, good eye contact.   ASSESSMENT AND PLAN:  [name] was here today annual physical examination.  Orders Placed This Encounter  Procedures   Hemoglobin A1c   Hepatitis C antibody    Lipid panel   Comprehensive metabolic panel   Lab Results  Component Value Date   CREATININE 0.62 10/16/2020   BUN 15 10/16/2020   NA 143 10/16/2020   K 5.0 10/16/2020   CL 108 10/16/2020   CO2 26 10/16/2020   Lab Results  Component Value Date   ALT 10 10/16/2020   AST 17 10/16/2020   ALKPHOS 87 10/16/2020   BILITOT 1.8 (H) 10/16/2020   Lab Results  Component Value Date   CHOL 151 10/16/2020   HDL 68.70 10/16/2020   LDLCALC 68 10/16/2020   TRIG 75.0 10/16/2020   CHOLHDL 2 10/16/2020   Lab Results  Component Value Date   HGBA1C 6.0 10/16/2020   Routine physical examination She understands the importance of regular low impact physical activity and healthy diet for prevention of chronic illness and/or complications. Preventive guidelines reviewed. Vaccination:She needs Shingrix but she prefers to ask her oncologist if she can take it. I do not see contraindications.  Ca++ and vit D supplementation recommended. Next CPE in a year.  Encounter for HCV screening test for low risk patient -     Hepatitis C antibody  Hyperglycemia Mild. Continue with a healthy life style for diabetes prevention. Further recommendations according to  HgA1C.  Pure hypercholesterolemia Continue Simvastatin 20 mg daily and low fat diet. LDL goal < 70.  Arthralgia of hand, left We discussed possible etiologies. Most likely OA, bilateral hand OA changes on examination.  In regard to left hip pain, she can follow with ortho. She can also start PT exercises she can do at home.  Ascending aortic aneurysm (HCC) It has been stable. Last chest CTA in 2020. She can discuss this with Dr Radford Pax.  Essential hypertension, benign BP adequately controlled. Continue Losartan 25 mg daily and Metoprolol succinate 50 mg daily. Low salt diet.  We reviewed CXR report, I do not think further work up is needed given the fact cough resolved. Future chest CTA's to follow on aortic aneurysm can also detect  pulmonary abnormalities if any.  Return in 1 year (on 10/16/2021).  Channin Agustin G. Martinique, MD  [facility]. [place] office.

## 2020-10-16 NOTE — Patient Instructions (Addendum)
A few things to remember from today's visit:  Encounter for HCV screening test for low risk patient - Plan: Hepatitis C antibody  Hyperglycemia - Plan: Hemoglobin A1c  Pure hypercholesterolemia - Plan: Lipid panel, Comprehensive metabolic panel  Routine physical examination  If you need refills please call your pharmacy. Do not use My Chart to request refills or for acute issues that need immediate attention.   A few tips:  -As we age balance is not as good as it was, so there is a higher risks for falls. Please remove small rugs and furniture that is "in your way" and could increase the risk of falls. Stretching exercises may help with fall prevention: Yoga and Tai Chi are some examples. Low impact exercise is better, so you are not very achy the next day.  -Sun screen and avoidance of direct sun light recommended. Caution with dehydration, if working outdoors be sure to drink enough fluids.  - Some medications are not safe as we age, increases the risk of side effects and can potentially interact with other medication you are also taken;  including some of over the counter medications. Be sure to let me know when you start a new medication even if it is a dietary/vitamin supplement.  -Healthy diet low in red meet/animal fat and sugar + regular physical activity is recommended.      Please be sure medication list is accurate. If a new problem present, please set up appointment sooner than planned today.

## 2020-10-17 LAB — HEPATITIS C ANTIBODY
Hepatitis C Ab: NONREACTIVE
SIGNAL TO CUT-OFF: 0 (ref <1.00)

## 2020-10-19 ENCOUNTER — Encounter: Payer: Self-pay | Admitting: Family Medicine

## 2020-11-23 ENCOUNTER — Other Ambulatory Visit: Payer: Self-pay | Admitting: Cardiology

## 2020-11-26 ENCOUNTER — Inpatient Hospital Stay: Payer: Medicare PPO

## 2020-11-26 ENCOUNTER — Inpatient Hospital Stay: Payer: Medicare PPO | Attending: Oncology

## 2020-11-26 ENCOUNTER — Other Ambulatory Visit (HOSPITAL_BASED_OUTPATIENT_CLINIC_OR_DEPARTMENT_OTHER): Payer: Self-pay

## 2020-11-26 ENCOUNTER — Other Ambulatory Visit: Payer: Self-pay | Admitting: *Deleted

## 2020-11-26 ENCOUNTER — Other Ambulatory Visit: Payer: Self-pay

## 2020-11-26 DIAGNOSIS — C7A019 Malignant carcinoid tumor of the small intestine, unspecified portion: Secondary | ICD-10-CM | POA: Diagnosis present

## 2020-11-26 DIAGNOSIS — I712 Thoracic aortic aneurysm, without rupture: Secondary | ICD-10-CM | POA: Diagnosis not present

## 2020-11-26 DIAGNOSIS — Q231 Congenital insufficiency of aortic valve: Secondary | ICD-10-CM | POA: Diagnosis not present

## 2020-11-26 DIAGNOSIS — R197 Diarrhea, unspecified: Secondary | ICD-10-CM | POA: Insufficient documentation

## 2020-11-26 MED ORDER — INFLUENZA VAC A&B SA ADJ QUAD 0.5 ML IM PRSY
PREFILLED_SYRINGE | INTRAMUSCULAR | 0 refills | Status: DC
  Filled 2020-11-26: qty 0.5, 1d supply, fill #0

## 2020-11-27 ENCOUNTER — Other Ambulatory Visit (HOSPITAL_BASED_OUTPATIENT_CLINIC_OR_DEPARTMENT_OTHER): Payer: Self-pay

## 2020-11-27 ENCOUNTER — Other Ambulatory Visit: Payer: Self-pay

## 2020-11-27 LAB — CHROMOGRANIN A: Chromogranin A (ng/mL): 77.8 ng/mL (ref 0.0–101.8)

## 2020-11-27 MED ORDER — METOPROLOL SUCCINATE ER 50 MG PO TB24: 50.0000 mg | ORAL_TABLET | Freq: Every day | ORAL | 3 refills | Status: DC

## 2020-11-27 NOTE — Telephone Encounter (Signed)
Pt's medication was sent to pt's pharmacy as requested. Confirmation received.  °

## 2020-11-28 ENCOUNTER — Other Ambulatory Visit: Payer: Self-pay | Admitting: Family Medicine

## 2020-11-28 DIAGNOSIS — E2839 Other primary ovarian failure: Secondary | ICD-10-CM

## 2020-11-29 ENCOUNTER — Inpatient Hospital Stay: Payer: Medicare PPO | Admitting: Oncology

## 2020-11-29 ENCOUNTER — Other Ambulatory Visit: Payer: Self-pay

## 2020-11-29 VITALS — BP 144/65 | HR 76 | Temp 97.7°F | Resp 18 | Ht 62.0 in | Wt 127.0 lb

## 2020-11-29 DIAGNOSIS — R197 Diarrhea, unspecified: Secondary | ICD-10-CM | POA: Diagnosis not present

## 2020-11-29 DIAGNOSIS — C7A019 Malignant carcinoid tumor of the small intestine, unspecified portion: Secondary | ICD-10-CM | POA: Diagnosis not present

## 2020-11-29 DIAGNOSIS — Q231 Congenital insufficiency of aortic valve: Secondary | ICD-10-CM | POA: Diagnosis not present

## 2020-11-29 DIAGNOSIS — I712 Thoracic aortic aneurysm, without rupture: Secondary | ICD-10-CM | POA: Diagnosis not present

## 2020-11-29 NOTE — Progress Notes (Signed)
  [facility] OFFICE PROGRESS NOTE   Diagnosis: Carcinoid tumor  INTERVAL HISTORY:   Debra [name] returns as scheduled.  She feels well.  Good appetite and energy level.  She has episodes of diarrhea once daily intermittently.  This happens up to a few times per month and she can go months between having an episode of diarrhea.  The diarrhea is unpredictable.  No fever or flushing spells.  Objective:  Vital signs in last 24 hours:  Blood pressure (!) 144/65, pulse 76, temperature 97.7 F (36.5 C), temperature source Oral, resp. rate 18, height 5\' 2"  (1.575 m), weight 127 lb (57.6 kg), SpO2 100 %.    Lymphatics: No cervical, supraclavicular, axillary, or inguinal nodes Resp: Lungs clear bilaterally Cardio: Regular rate and rhythm GI: No hepatosplenomegaly, no mass, nontender Vascular: No leg edema   Lab Results:  Lab Results  Component Value Date   WBC 6.2 11/29/2019   HGB 12.3 11/29/2019   HCT 38.2 11/29/2019   MCV 88.4 11/29/2019   PLT 199 11/29/2019   NEUTROABS 4.5 05/13/2015    CMP  Lab Results  Component Value Date   NA 143 10/16/2020   K 5.0 10/16/2020   CL 108 10/16/2020   CO2 26 10/16/2020   GLUCOSE 91 10/16/2020   BUN 15 10/16/2020   CREATININE 0.62 10/16/2020   CALCIUM 9.1 10/16/2020   PROT 5.9 (L) 10/16/2020   ALBUMIN 4.1 10/16/2020   AST 17 10/16/2020   ALT 10 10/16/2020   ALKPHOS 87 10/16/2020   BILITOT 1.8 (H) 10/16/2020   GFRNONAA >60 11/29/2019   GFRAA >60 11/29/2019  Chromogranin A on 920 22-70 7.8 Medications: I have reviewed the patient's current medications.   Assessment/Plan: Carcinoid tumors involving the the ileum, status post a small bowel resection 05/14/2014 with 1.4 cm and 1.1 cm tumors noted,T4Nx The larger tumor showed focal involvement of the serosal surface, resection margins negative, lymphovascular invasion present Persistent abdominal pain and nausea, CT showed evidence of a distal small bowel obstruction  10/03/2014 with an octreotide scan 10/17/2014 confirming positive uptake in a mesenteric lymph node Small bowel resection 11/15/2014 confirmed multiple small bowel neuroendocrine tumors, grade 2, with metastatic tumor involving 4 lymph nodes   History of intermittent muscle pain and nausea/vomiting secondary to bowel obstruction from #1   3.   History of Microcytic anemia-secondary to iron deficiency, likely secondary to bleeding from the small bowel neuroendocrine tumors    4.   Thoracic Aortic aneurysm   5.   Bicuspid aortic valve      Disposition: Debra [name] remains in clinical remission from the carcinoid tumors.  The chromogranin a level is normal.  She will return for an office visit and chromogranin a level in 1 year.  She will continue follow-up with cardiology and cardiac surgery for management of the thoracic aortic aneurysm and bicuspid aortic valve.  Betsy Coder, MD  11/29/2020  10:05 AM

## 2020-12-03 ENCOUNTER — Other Ambulatory Visit: Payer: Self-pay

## 2020-12-03 ENCOUNTER — Ambulatory Visit
Admission: RE | Admit: 2020-12-03 | Discharge: 2020-12-03 | Disposition: A | Discharge status: Home / Self Care | Payer: Medicare PPO | Source: Ambulatory Visit | Attending: Family Medicine | Admitting: Family Medicine

## 2020-12-03 DIAGNOSIS — M85851 Other specified disorders of bone density and structure, right thigh: Secondary | ICD-10-CM | POA: Diagnosis not present

## 2020-12-03 DIAGNOSIS — Z780 Asymptomatic menopausal state: Secondary | ICD-10-CM | POA: Diagnosis not present

## 2020-12-03 DIAGNOSIS — E2839 Other primary ovarian failure: Secondary | ICD-10-CM

## 2020-12-04 ENCOUNTER — Encounter: Payer: Self-pay | Admitting: Family Medicine

## 2020-12-04 DIAGNOSIS — M8580 Other specified disorders of bone density and structure, unspecified site: Secondary | ICD-10-CM | POA: Insufficient documentation

## 2020-12-09 NOTE — Progress Notes (Addendum)
ACUTE VISIT Chief Complaint  Patient presents with   Otalgia    Right ear, comes & goes, ongoing for about 2 months. Feels stuffy & full, then goes away. Has some pain    HPI: Debra [name] is a pleasant 70 y.o. female, who is here today complaining of right earache as described above.  She has not identified exacerbating or alleviating factors.  Negative for recent travel or URI. Fullness sensation. Occasionally rhinorrhea, no significant nasal congestion. Negative for associated tinnitus or dizziness. Otalgia  There is pain in the right ear. This is a new problem. The current episode started more than 1 month ago. The problem has been waxing and waning. There has been no fever. The pain is mild. Associated symptoms include hearing loss. Pertinent negatives include no abdominal pain, coughing, ear discharge, headaches, rash, rhinorrhea, sore throat or vomiting. There is no history of a chronic ear infection or a tympanostomy tube.   Alcohol in ear canal and home remedies have not helped. It feels better today. She is planning on flying next month and afraid that problem may get worse.  Review of Systems  Constitutional:  Negative for activity change, appetite change, chills and fever.  HENT:  Positive for ear pain and hearing loss. Negative for ear discharge, rhinorrhea and sore throat.   Respiratory:  Negative for cough and wheezing.   Cardiovascular:  Negative for chest pain and palpitations.  Gastrointestinal:  Negative for abdominal pain, nausea and vomiting.  Musculoskeletal:  Negative for gait problem and myalgias.  Skin:  Negative for rash.  Allergic/Immunologic: Negative for environmental allergies.  Neurological:  Negative for syncope, weakness and headaches.  Rest see pertinent positives and negatives per HPI.  Current Outpatient Medications on File Prior to Visit  Medication Sig Dispense Refill   aspirin (ASPIRIN 81) 81 MG chewable tablet Chew 1  tablet (81 mg total) by mouth 2 (two) times daily after a meal. (Patient taking differently: Chew 81 mg by mouth daily.) 30 tablet 0   losartan (COZAAR) 25 MG tablet TAKE 1 TABLET BY MOUTH EVERY DAY 90 tablet 3   metoprolol succinate (TOPROL-XL) 50 MG 24 hr tablet Take 1 tablet (50 mg total) by mouth daily. ** DO NOT CRUSH ** (BETA BLOCKER) Please keep upcoming appt in January 2023 with Dr. Radford Pax for future refills. Thank you 30 tablet 3   simvastatin (ZOCOR) 20 MG tablet TAKE 1 TABLET (20 MG TOTAL) BY MOUTH DAILY AT 6 PM. 90 tablet 2   No current facility-administered medications on file prior to visit.   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   Colon cancer ([place])    NEUROENDOCRINE    Coronary artery disease    nonobstructive   H/O echocardiogram    bicuspid aortic valve with moderate aortic valve sclerosis,mild PR.TR.MR and mildly dilated aorta by echo on 8.2012   Heart murmur    History of kidney stones    Hypertension    Iron deficiency anemia    "can't tolerate the supplements right now" (11/15/2014)   Mild mitral regurgitation    PONV (postoperative nausea and vomiting)    PREOP YEARS AGO WITH HYSTERECTOMY FELT " CREEPY" PRIOR TO SURGERY    Thoracic aortic aneurysm    mild, 4.1  cm by recent CT followed by Dr Darcey Nora   Allergies  Allergen Reactions   Codeine Anaphylaxis and Shortness Of Breath   Opium Anaphylaxis   Oxycodone Shortness Of Breath   Penicillins Anaphylaxis and Shortness Of Breath   Azithromycin Diarrhea    Pt request not to take     Social History   Socioeconomic History   Marital status: Married    Spouse name: Not on file   Number of children: 2   Years of education: 17   Highest education level: Not on file  Occupational History   Not on file  Tobacco Use    Smoking status: Never   Smokeless tobacco: Never  Vaping Use   Vaping Use: Never used  Substance and Sexual Activity   Alcohol use: Yes    Alcohol/week: 4.0 standard drinks    Types: 4 Glasses of wine per week    Comment: 5 GLASSES OF WINE PER WEEK    Drug use: No   Sexual activity: Yes  Other Topics Concern   Not on file  Social History Narrative   Married, spouse Taylan Mayhan   Social Determinants of Health   Financial Resource Strain: Low Risk    Difficulty of Paying Living Expenses: Not hard at all  Food Insecurity: No Food Insecurity   Worried About Charity fundraiser in the Last Year: Never true   Arboriculturist in the Last Year: Never true  Transportation Needs: No Transportation Needs   Lack of Transportation (Medical): No   Lack of Transportation (Non-Medical): No  Physical Activity: Insufficiently Active   Days of Exercise per Week: 3 days   Minutes of Exercise per Session: 30 min  Stress: No Stress Concern Present   Feeling of Stress : Not at all  Social Connections: Socially Integrated   Frequency of Communication with Friends and Family: More than three times a week   Frequency of Social Gatherings with Friends and Family: More than three times a week   Attends Religious Services: 1 to 4 times per year   Active Member of [employer] or Organizations: Yes   Attends Archivist Meetings: 1 to 4 times per year   Marital Status: Married    Vitals:   12/10/20 0921  BP: 126/72  Pulse: 65  Resp: 16  Temp: 98.2 F (36.8 C)  SpO2: 99%   Body mass index is 23.23 kg/m.  Physical Exam Vitals and nursing note reviewed.  Constitutional:      General: She is not in acute distress.    Appearance: She is well-developed, well-groomed and normal weight. She is not ill-appearing.  HENT:     Head: Normocephalic and atraumatic.     Right Ear: Ear canal and external ear normal. Decreased hearing noted. A middle ear effusion is present. Tympanic membrane is  bulging. Tympanic membrane is not erythematous.     Left Ear: Tympanic membrane, ear canal and external ear normal.     Nose: Septal deviation present.     Right Turbinates: Enlarged.     Left Turbinates: Enlarged.     Right Sinus: No maxillary sinus tenderness or frontal sinus tenderness.     Left Sinus: No maxillary sinus tenderness or frontal sinus tenderness.  Eyes:     Conjunctiva/sclera: Conjunctivae normal.  Cardiovascular:     Rate and Rhythm: Normal rate and regular rhythm.     Heart sounds: No murmur heard. Pulmonary:     Effort: Pulmonary effort is normal.  No respiratory distress.     Breath sounds: Normal breath sounds. No stridor.  Lymphadenopathy:     Head:     Right side of head: No submandibular or preauricular adenopathy.     Left side of head: No submandibular or preauricular adenopathy.     Cervical: No cervical adenopathy.  Skin:    General: Skin is warm.     Findings: No erythema or rash.  Neurological:     Mental Status: She is alert and oriented to person, place, and time.  Psychiatric:        Mood and Affect: Mood and affect normal.   ASSESSMENT AND PLAN:  Ms. Shanine was seen today for otalgia.  Diagnoses and all orders for this visit: Orders Placed This Encounter  Procedures   Ambulatory referral to ENT   Earache, right We discussed possible etiologies. Hx and examination today do not suggest a serious process. Because persistent, ENT evaluation recommended.  Dysfunction of right eustachian tube This problem could explained some of her symptoms. Auto inflation maneuvers a few times per day, daily Sudafed 12 hours once daily x 7-10 days, and Flonase nasal spray daily for a few days may help. Some side effects of decongestants discussed. Flying may aggravate problem, chewing gum during flight may help.  -     fluticasone (FLONASE) 50 MCG/ACT nasal spray; Place 1 spray into both nostrils 2 (two) times daily.  Hearing loss of right ear,  unspecified hearing loss type Hearing screening today showed decreased hearing [phone], ? Conductive hearing loss. She agrees with ENT evaluation.   Return if symptoms worsen or fail to improve.   Iveliz Garay G. Martinique, MD  [facility]. [place] office.  Discharge Instructions   None

## 2020-12-10 ENCOUNTER — Ambulatory Visit: Payer: Medicare PPO | Admitting: Family Medicine

## 2020-12-10 ENCOUNTER — Encounter: Payer: Self-pay | Admitting: Family Medicine

## 2020-12-10 ENCOUNTER — Other Ambulatory Visit: Payer: Self-pay

## 2020-12-10 VITALS — BP 126/72 | HR 65 | Temp 98.2°F | Resp 16 | Ht 62.0 in | Wt 127.0 lb

## 2020-12-10 DIAGNOSIS — H9191 Unspecified hearing loss, right ear: Secondary | ICD-10-CM

## 2020-12-10 DIAGNOSIS — H6981 Other specified disorders of Eustachian tube, right ear: Secondary | ICD-10-CM | POA: Diagnosis not present

## 2020-12-10 DIAGNOSIS — H9201 Otalgia, right ear: Secondary | ICD-10-CM | POA: Diagnosis not present

## 2020-12-10 MED ORDER — FLUTICASONE PROPIONATE 50 MCG/ACT NA SUSP: 1.0000 | Freq: Two times a day (BID) | NASAL | 3 refills | Status: DC

## 2020-12-10 NOTE — Patient Instructions (Addendum)
A few things to remember from today's visit:  Dysfunction of right eustachian tube  Hearing loss of right ear, unspecified hearing loss type - Plan: Ambulatory referral to ENT Popping ears a few times during the day, Sudafed 12 hours once daily for 7 days, and Flonase nasal spray may help. Because associated hearing loss, I think we can arrange appt with ENT. Problem can get worse with flying.  If you need refills please call your pharmacy. Do not use My Chart to request refills or for acute issues that need immediate attention.  Eustachian Tube Dysfunction Eustachian tube dysfunction refers to a condition in which a blockage develops in the narrow passage that connects the middle ear to the back of the nose (eustachian tube). The eustachian tube regulates air pressure in the middle ear by letting air move between the ear and nose. It also helps to drain fluid from the middle ear space. Eustachian tube dysfunction can affect one or both ears. When the eustachian tube does not function properly, air pressure, fluid, or both can build up in the middle ear. What are the causes? This condition occurs when the eustachian tube becomes blocked or cannot open normally. Common causes of this condition include: Ear infections. Colds and other infections that affect the nose, mouth, and throat (upper respiratory tract). Allergies. Irritation from cigarette smoke. Irritation from stomach acid coming up into the esophagus (gastroesophageal reflux). The esophagus is the part of the body that moves food from the mouth to the stomach. Sudden changes in air pressure, such as from descending in an airplane or scuba diving. Abnormal growths in the nose or throat, such as: Growths that line the nose (nasal polyps). Abnormal growth of cells (tumors). Enlarged tissue at the back of the throat (adenoids). What increases the risk? You are more likely to develop this condition if: You smoke. You are  overweight. You are a child who has: Certain birth defects of the mouth, such as cleft palate. Large tonsils or adenoids. What are the signs or symptoms? Common symptoms of this condition include: A feeling of fullness in the ear. Ear pain. Clicking or popping noises in the ear. Ringing in the ear (tinnitus). Hearing loss. Loss of balance. Dizziness. Symptoms may get worse when the air pressure around you changes, such as when you travel to an area of high elevation, fly on an airplane, or go scuba diving. How is this diagnosed? This condition may be diagnosed based on: Your symptoms. A physical exam of your ears, nose, and throat. Tests, such as those that measure: The movement of your eardrum. Your hearing (audiometry). How is this treated? Treatment depends on the cause and severity of your condition. In mild cases, you may relieve your symptoms by moving air into your ears. This is called "popping the ears." In more severe cases, or if you have symptoms of fluid in your ears, treatment may include: Medicines to relieve congestion (decongestants). Medicines that treat allergies (antihistamines). Nasal sprays or ear drops that contain medicines that reduce swelling (steroids). A procedure to drain the fluid in your eardrum. In this procedure, a small tube may be placed in the eardrum to: Drain the fluid. Restore the air in the middle ear space. A procedure to insert a balloon device through the nose to inflate the opening of the eustachian tube (balloon dilation). Follow these instructions at home: Lifestyle Do not do any of the following until your health care provider approves: Travel to high altitudes. Fly in airplanes.  Work in a Pension scheme manager or room. Scuba dive. Do not use any products that contain nicotine or tobacco. These products include cigarettes, chewing tobacco, and vaping devices, such as e-cigarettes. If you need help quitting, ask your health care  provider. Keep your ears dry. Wear fitted earplugs during showering and bathing. Dry your ears completely after. General instructions Take over-the-counter and prescription medicines only as told by your health care provider. Use techniques to help pop your ears as recommended by your health care provider. These may include: Chewing gum. Yawning. Frequent, forceful swallowing. Closing your mouth, holding your nose closed, and gently blowing as if you are trying to blow air out of your nose. Keep all follow-up visits. This is important. Contact a health care provider if: Your symptoms do not go away after treatment. Your symptoms come back after treatment. You are unable to pop your ears. You have: A fever. Pain in your ear. Pain in your head or neck. Fluid draining from your ear. Your hearing suddenly changes. You become very dizzy. You lose your balance. Get help right away if: You have a sudden, severe increase in any of your symptoms. Summary Eustachian tube dysfunction refers to a condition in which a blockage develops in the eustachian tube. It can be caused by ear infections, allergies, inhaled irritants, or abnormal growths in the nose or throat. Symptoms may include ear pain or fullness, hearing loss, or ringing in the ears. Mild cases are treated with techniques to unblock the ears, such as yawning or chewing gum. More severe cases are treated with medicines or procedures. This information is not intended to replace advice given to you by your health care provider. Make sure you discuss any questions you have with your health care provider. Document Revised: 05/06/2020 Document Reviewed: 05/06/2020 Elsevier Patient Education  2022 [place].   Please be sure medication list is accurate. If a new problem present, please set up appointment sooner than planned today.

## 2020-12-15 ENCOUNTER — Encounter: Payer: Self-pay | Admitting: Family Medicine

## 2020-12-17 ENCOUNTER — Telehealth: Payer: Self-pay

## 2020-12-17 NOTE — Telephone Encounter (Signed)
TC from Pt seeking some clarity from lab work done by her provider from Dr. Benay Spice. Pt concerned about her bilirubin results which were 1.8 Discussed with Dr Benay Spice Per Dr Benay Spice Pt should not be concerned about this elevated bilirubin. Pt verbalized understanding.

## 2021-01-07 ENCOUNTER — Other Ambulatory Visit: Payer: Medicare PPO

## 2021-01-07 ENCOUNTER — Ambulatory Visit: Payer: Medicare PPO | Admitting: Oncology

## 2021-01-17 ENCOUNTER — Other Ambulatory Visit: Payer: Self-pay

## 2021-01-17 ENCOUNTER — Ambulatory Visit (INDEPENDENT_AMBULATORY_CARE_PROVIDER_SITE_OTHER): Payer: Medicare PPO | Admitting: Internal Medicine

## 2021-01-17 ENCOUNTER — Encounter: Payer: Self-pay | Admitting: Internal Medicine

## 2021-01-17 VITALS — BP 130/70 | HR 83 | Temp 97.6°F | Wt 129.2 lb

## 2021-01-17 DIAGNOSIS — N3001 Acute cystitis with hematuria: Secondary | ICD-10-CM

## 2021-01-17 DIAGNOSIS — R319 Hematuria, unspecified: Secondary | ICD-10-CM

## 2021-01-17 LAB — POCT URINALYSIS DIPSTICK
Bilirubin, UA: NEGATIVE
Blood, UA: POSITIVE
Glucose, UA: NEGATIVE
Ketones, UA: NEGATIVE
Leukocytes, UA: NEGATIVE
Nitrite, UA: NEGATIVE
Protein, UA: NEGATIVE
Spec Grav, UA: 1.01 (ref 1.010–1.025)
Urobilinogen, UA: 0.2 E.U./dL
pH, UA: 6 (ref 5.0–8.0)

## 2021-01-17 LAB — URINALYSIS
Bilirubin Urine: NEGATIVE
Ketones, ur: NEGATIVE
Leukocytes,Ua: NEGATIVE
Nitrite: NEGATIVE
Specific Gravity, Urine: <=1.005 — AB (ref 1.000–1.030)
Total Protein, Urine: NEGATIVE
Urine Glucose: NEGATIVE
Urobilinogen, UA: 0.2 (ref 0.0–1.0)
pH: 6 (ref 5.0–8.0)

## 2021-01-17 MED ORDER — SULFAMETHOXAZOLE-TRIMETHOPRIM 800-160 MG PO TABS: 1.0000 | ORAL_TABLET | Freq: Two times a day (BID) | ORAL | 0 refills | Status: AC

## 2021-01-17 NOTE — Addendum Note (Signed)
Addended by: Rosalyn Gess D on: 01/17/2021 11:23 AM   Modules accepted: Orders

## 2021-01-17 NOTE — Progress Notes (Signed)
Acute office Visit     This visit occurred during the SARS-CoV-2 public health emergency.  Safety protocols were in place, including screening questions prior to the visit, additional usage of staff PPE, and extensive cleaning of exam room while observing appropriate contact time as indicated for disinfecting solutions.    CC/Reason for Visit: Urinary frequency  HPI: Debra [name] is a 70 y.o. female who is coming in today for the above mentioned reasons.  Last night she noticed the onset of urinary frequency, no dysuria or foul order.  This is reminiscent of prior UTIs.  She is getting ready to leave on a trip to [place] this afternoon.  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   Colon cancer ([place])    NEUROENDOCRINE    Coronary artery disease    nonobstructive   H/O echocardiogram    bicuspid aortic valve with moderate aortic valve sclerosis,mild PR.TR.MR and mildly dilated aorta by echo on 8.2012   Heart murmur    History of kidney stones    Hypertension    Iron deficiency anemia    "can't tolerate the supplements right now" (11/15/2014)   Mild mitral regurgitation    PONV (postoperative nausea and vomiting)    PREOP YEARS AGO WITH HYSTERECTOMY FELT " CREEPY" PRIOR TO SURGERY    Thoracic aortic aneurysm    mild, 4.1 cm by recent CT followed by Dr Darcey Nora    Past Surgical History:  Procedure Laterality Date   ABDOMINAL HYSTERECTOMY     BOWEL RESECTION N/A 11/15/2014   Procedure: SMALL BOWEL RESECTION;  Surgeon: [name], MD;  Location: [facility];  Service: General;  Laterality: N/A;   CATARACT EXTRACTION W/ INTRAOCULAR LENS IMPLANT     COLON SURGERY     COLONOSCOPY  10/15/11   diverticulosis present, mild severity, no polyps, repeat in 10  years.   LAPAROSCOPIC LYSIS OF ADHESIONS N/A 05/14/2014   Procedure: LAPAROSCOPIC LYSIS OF ADHESIONS x 59mins;  Surgeon: [name], MD;  Location: [facility];  Service: General;  Laterality: N/A;   LAPAROSCOPIC SMALL BOWEL RESECTION N/A 05/14/2014   Procedure: LAPAROSCOPIC ASSISTED SMALL BOWEL RESECTION;  Surgeon: [name], MD;  Location: [facility];  Service: General;  Laterality: N/A;   SMALL INTESTINE SURGERY  11/15/2014   TONSILLECTOMY     TOTAL HIP ARTHROPLASTY Left 12/08/2019   Procedure: LEFT TOTAL HIP ARTHROPLASTY ANTERIOR APPROACH;  Surgeon: Mcarthur Rossetti, MD;  Location: [facility];  Service: Orthopedics;  Laterality: Left;    Social History:  reports that she has never smoked. She has never used smokeless tobacco. She reports current alcohol use of about 4.0 standard drinks per week. She reports that she does not use drugs.  Allergies: Allergies  Allergen Reactions   Codeine Anaphylaxis and Shortness Of Breath   Opium Anaphylaxis   Oxycodone Shortness Of Breath   Penicillins Anaphylaxis and Shortness Of Breath   Azithromycin Diarrhea    Pt request not to take     Family History:  Family History  Problem Relation Age of Onset   CAD Maternal Grandfather    CAD Paternal Grandmother    Heart attack Paternal Grandmother    CAD Paternal Grandfather    Breast cancer Mother  Cancer Father    Diabetes Father    Stroke Neg Hx    Hypertension Neg Hx      Current Outpatient Medications:    aspirin (ASPIRIN 81) 81 MG chewable tablet, Chew 1 tablet (81 mg total) by mouth 2 (two) times daily after a meal. (Patient taking differently: Chew 81 mg by mouth daily.), Disp: 30 tablet, Rfl: 0   fluticasone (FLONASE) 50 MCG/ACT nasal spray, Place 1 spray into both nostrils 2 (two) times daily., Disp: 16 g, Rfl: 3   losartan (COZAAR) 25 MG tablet, TAKE 1 TABLET BY MOUTH EVERY DAY, Disp: 90 tablet, Rfl: 3   metoprolol succinate (TOPROL-XL) 50 MG 24 hr tablet, Take 1 tablet (50 mg  total) by mouth daily. ** DO NOT CRUSH ** (BETA BLOCKER) Please keep upcoming appt in January 2023 with Dr. Radford Pax for future refills. Thank you, Disp: 30 tablet, Rfl: 3   simvastatin (ZOCOR) 20 MG tablet, TAKE 1 TABLET (20 MG TOTAL) BY MOUTH DAILY AT 6 PM., Disp: 90 tablet, Rfl: 2  Review of Systems:  Constitutional: Denies fever, chills, diaphoresis, appetite change and fatigue.  HEENT: Denies photophobia, eye pain, redness, hearing loss, ear pain, congestion, sore throat, rhinorrhea, sneezing, mouth sores, trouble swallowing, neck pain, neck stiffness and tinnitus.   Respiratory: Denies SOB, DOE, cough, chest tightness,  and wheezing.   Cardiovascular: Denies chest pain, palpitations and leg swelling.  Gastrointestinal: Denies nausea, vomiting, abdominal pain, diarrhea, constipation, blood in stool and abdominal distention.  Genitourinary: Denies dysuria,  hematuria, flank pain and difficulty urinating.  Endocrine: Denies: hot or cold intolerance, sweats, changes in hair or nails, polyuria, polydipsia. Musculoskeletal: Denies myalgias, back pain, joint swelling, arthralgias and gait problem.  Skin: Denies pallor, rash and wound.  Neurological: Denies dizziness, seizures, syncope, weakness, light-headedness, numbness and headaches.  Hematological: Denies adenopathy. Easy bruising, personal or family bleeding history  Psychiatric/Behavioral: Denies suicidal ideation, mood changes, confusion, nervousness, sleep disturbance and agitation    Physical Exam: Vitals:   01/17/21 0841  BP: 130/70  Pulse: 83  Temp: 97.6 F (36.4 C)  TempSrc: Oral  SpO2: 98%  Weight: 129 lb 3.2 oz (58.6 kg)    Body mass index is 23.63 kg/m.   Constitutional: NAD, calm, comfortable Eyes: PERRL, lids and conjunctivae normal, wears corrective lenses ENMT: Mucous membranes are moist.  Psychiatric: Normal judgment and insight. Alert and oriented x 3. Normal mood.    Impression and Plan:  Acute cystitis  with hematuria - Plan: POCT urinalysis dipstick -Urine dipstick is positive for blood, will send for UA and culture. -Will prescribe Bactrim for 5 days.  If symptoms persist will consider imaging.     Lelon Frohlich, MD [facility]

## 2021-01-19 LAB — URINE CULTURE
MICRO NUMBER:: 12626454
SPECIMEN QUALITY:: ADEQUATE

## 2021-01-20 ENCOUNTER — Other Ambulatory Visit: Payer: Self-pay | Admitting: Cardiology

## 2021-02-17 ENCOUNTER — Other Ambulatory Visit: Payer: Self-pay | Admitting: Cardiology

## 2021-02-18 ENCOUNTER — Other Ambulatory Visit: Payer: Self-pay | Admitting: Cardiology

## 2021-03-11 DIAGNOSIS — H2511 Age-related nuclear cataract, right eye: Secondary | ICD-10-CM | POA: Diagnosis not present

## 2021-03-11 DIAGNOSIS — H5213 Myopia, bilateral: Secondary | ICD-10-CM | POA: Diagnosis not present

## 2021-04-01 ENCOUNTER — Ambulatory Visit: Payer: Medicare PPO | Admitting: Cardiology

## 2021-04-15 ENCOUNTER — Encounter: Payer: Self-pay | Admitting: Family Medicine

## 2021-04-15 ENCOUNTER — Telehealth (INDEPENDENT_AMBULATORY_CARE_PROVIDER_SITE_OTHER): Payer: Medicare PPO | Admitting: Family Medicine

## 2021-04-15 VITALS — Ht 62.0 in | Wt 129.2 lb

## 2021-04-15 DIAGNOSIS — U071 COVID-19: Secondary | ICD-10-CM

## 2021-04-15 MED ORDER — NIRMATRELVIR/RITONAVIR (PAXLOVID)TABLET: 3.0000 | ORAL_TABLET | Freq: Two times a day (BID) | ORAL | 0 refills | Status: AC

## 2021-04-15 NOTE — Patient Instructions (Addendum)
HOME CARE TIPS:    -I sent the medication(s) we discussed to your pharmacy: Meds ordered this encounter  Medications   nirmatrelvir/ritonavir EUA (PAXLOVID) 20 x 150 MG & 10 x 100MG TABS    Sig: Take 3 tablets by mouth 2 (two) times daily for 5 days. (Take nirmatrelvir 150 mg two tablets twice daily for 5 days and ritonavir 100 mg one tablet twice daily for 5 days) Patient GFR is >60    Dispense:  30 tablet    Refill:  0     -I sent in the [facility] treatment or referral you requested per our discussion. Please see the information provided below and discuss further with the pharmacist/treatment team.  -If taking Paxlovid, please review all medications, supplement and over the counter drugs with your pharmacist and ask them to check for any interactions. Please make the following changes to your regular medications while taking Paxlovid: *PLEASE stop your Simvastatin today and restart 3 days after finishing the Paxlovid   -there is a chance of rebound illness after finishing your treatment. If you become sick again please isolate for an additional 5 days, plus 5 more days of masking.   -can use tylenol needed for fevers, aches and pains per instructions  -can use nasal saline a few times per day if you have nasal congestion  -stay hydrated, drink plenty of fluids and eat small healthy meals - avoid dairy   -follow up with your doctor in 2-3 days unless improving and feeling better  -stay home while sick, except to seek medical care. If you have COVID19, you will likely be contagious for 7-10 days. Flu or Influenza is likely contagious for about 7 days. Other respiratory viral infections remain contagious for 5-10+ days depending on the virus and many other factors. Wear a good mask that fits snugly (such as N95 or KN95) if around others to reduce the risk of transmission.  It was nice to meet you today, and I really hope you are feeling better soon. I help [facility] out with  telemedicine visits on Tuesdays and Thursdays and am happy to help if you need a follow up virtual visit on those days. Otherwise, if you have any concerns or questions following this visit please schedule a follow up visit with your Primary Care doctor or seek care at a local urgent care clinic to avoid delays in care.    Seek in person care or schedule a follow up video visit promptly if your symptoms worsen, new concerns arise or you are not improving with treatment. Call 911 and/or seek emergency care if your symptoms are severe or life threatening.  FACT SHEET FOR PATIENTS, PARENTS, AND CAREGIVERS EMERGENCY USE AUTHORIZATION (EUA) OF PAXLOVID FOR CORONAVIRUS DISEASE 2019 (COVID-19) You are being given this Fact Sheet because your healthcare provider believes it is necessary to provide you with PAXLOVID for the treatment of mild-to-moderate coronavirus disease (COVID-19) caused by the SARS-CoV-2 virus. This Fact Sheet contains information to help you understand the risks and benefits of taking the PAXLOVID you have received or may receive. The U.S. Food and Drug Administration (FDA) has issued an Emergency Use Authorization (EUA) to make PAXLOVID available during the COVID-19 pandemic (for more details about an EUA please see What is an Emergency Use Authorization? at the end of this document). PAXLOVID is not an FDA-approved medicine in the [place]. Read this Fact Sheet for information about PAXLOVID. Talk to your healthcare provider about your options or if you have  any questions. It is your choice to take PAXLOVID.  What is COVID-19? COVID-19 is caused by a virus called a coronavirus. You can get COVID-19 through close contact with another person who has the virus. COVID-19 illnesses have ranged from very mild-to-severe, including illness resulting in death. While information so far suggests that most COVID-19 illness is mild, serious illness can happen and may cause some of  your other medical conditions to become worse. Older people and people of all ages with severe, long lasting (chronic) medical conditions like heart disease, lung disease, and diabetes, for example seem to be at higher risk of being hospitalized for COVID-19.  What is PAXLOVID? PAXLOVID is an investigational medicine used to treat mild-to-moderate COVID-19 in adults and children [55 years of age and older weighing at least 63 pounds (70 kg)] with positive results of direct SARS-CoV-2 viral testing, and who are at high risk for progression to severe COVID-19, including hospitalization or death. PAXLOVID is investigational because it is still being studied. There is limited information about the safety and effectiveness of using PAXLOVID to treat people with mild-to-moderate COVID-19.  The FDA has authorized the emergency use of PAXLOVID for the treatment of mild-tomoderate COVID-19 in adults and children [92 years of age and older weighing at least 2 pounds (77 kg)] with a positive test for the virus that causes COVID-19, and who are at high risk for progression to severe COVID-19, including hospitalization or death, under an EUA. 1 Revised: 24 May 2020   What should I tell my healthcare provider before I take PAXLOVID? Tell your healthcare provider if you: ? Have any allergies ? Have liver or kidney disease ? Are pregnant or plan to become pregnant ? Are breastfeeding a child ? Have any serious illnesses  Tell your healthcare provider about all the medicines you take, including prescription and over-the-counter medicines, vitamins, and herbal supplements. Some medicines may interact with PAXLOVID and may cause serious side effects. Keep a list of your medicines to show your healthcare provider and pharmacist when you get a new medicine.  You can ask your healthcare provider or pharmacist for a list of medicines that interact with PAXLOVID. Do not start taking a new medicine  without telling your healthcare provider. Your healthcare provider can tell you if it is safe to take PAXLOVID with other medicines.  Tell your healthcare provider if you are taking combined hormonal contraceptive. PAXLOVID may affect how your birth control pills work. Females who are able to become pregnant should use another effective alternative form of contraception or an additional barrier method of contraception. Talk to your healthcare provider if you have any questions about contraceptive methods that might be right for you.  How do I take PAXLOVID? ? PAXLOVID consists of 2 medicines: nirmatrelvir and ritonavir. o Take 2 pink tablets of nirmatrelvir with 1 white tablet of ritonavir by mouth 2 times each day (in the morning and in the evening) for 5 days. For each dose, take all 3 tablets at the same time. o If you have kidney disease, talk to your healthcare provider. You may need a different dose. ? Swallow the tablets whole. Do not chew, break, or crush the tablets. ? Take PAXLOVID with or without food. ? Do not stop taking PAXLOVID without talking to your healthcare provider, even if you feel better. ? If you miss a dose of PAXLOVID within 8 hours of the time it is usually taken, take it as soon as you remember. If you  miss a dose by more than 8 hours, skip the missed dose and take the next dose at your regular time. Do not take 2 doses of PAXLOVID at the same time. ? If you take too much PAXLOVID, call your healthcare provider or go to the nearest hospital emergency room right away. ? If you are taking a ritonavir- or cobicistat-containing medicine to treat hepatitis C or Human Immunodeficiency Virus (HIV), you should continue to take your medicine as prescribed by your healthcare provider. 2 Revised: 24 May 2020    Talk to your healthcare provider if you do not feel better or if you feel worse after 5 days.  Who should generally not take PAXLOVID? Do not take  PAXLOVID if: ? You are allergic to nirmatrelvir, ritonavir, or any of the ingredients in PAXLOVID. ? You are taking any of the following medicines: o Alfuzosin o Pethidine, propoxyphene o Ranolazine o Amiodarone, dronedarone, flecainide, propafenone, quinidine o Colchicine o Lurasidone, pimozide, clozapine o Dihydroergotamine, ergotamine, methylergonovine o Lovastatin, simvastatin o Sildenafil (Revatio) for pulmonary arterial hypertension (PAH) o Triazolam, oral midazolam o Apalutamide o Carbamazepine, phenobarbital, phenytoin o Rifampin o St. Johns Wort (hypericum perforatum) Taking PAXLOVID with these medicines may cause serious or life-threatening side effects or affect how PAXLOVID works.  These are not the only medicines that may cause serious side effects if taken with PAXLOVID. PAXLOVID may increase or decrease the levels of multiple other medicines. It is very important to tell your healthcare provider about all of the medicines you are taking because additional laboratory tests or changes in the dose of your other medicines may be necessary while you are taking PAXLOVID. Your healthcare provider may also tell you about specific symptoms to watch out for that may indicate that you need to stop or decrease the dose of some of your other medicines.  What are the important possible side effects of PAXLOVID? Possible side effects of PAXLOVID are: ? Allergic Reactions. Allergic reactions can happen in people taking PAXLOVID, even after only 1 dose. Stop taking PAXLOVID and call your healthcare provider right away if you get any of the following symptoms of an allergic reaction: o hives o trouble swallowing or breathing o swelling of the mouth, lips, or face o throat tightness o hoarseness 3 Revised: 24 May 2020  o skin rash ? Liver Problems. Tell your healthcare provider right away if you have any of these signs and symptoms of liver problems: loss of appetite,  yellowing of your skin and the whites of eyes (jaundice), dark-colored urine, pale colored stools and itchy skin, stomach area (abdominal) pain. ? Resistance to HIV Medicines. If you have untreated HIV infection, PAXLOVID may lead to some HIV medicines not working as well in the future. ? Other possible side effects include: o altered sense of taste o diarrhea o high blood pressure o muscle aches These are not all the possible side effects of PAXLOVID. Not many people have taken PAXLOVID. Serious and unexpected side effects may happen. PAXLOVID is still being studied, so it is possible that all of the risks are not known at this time.  What other treatment choices are there? Veklury (remdesivir) is FDA-approved for the treatment of mild-to-moderate MCNOB-09 in certain adults and children. Talk with your doctor to see if Marijean Heath is appropriate for you. Like PAXLOVID, FDA may also allow for the emergency use of other medicines to treat people with COVID-19. Go to https://price.info/ for information on the emergency use of other medicines that are authorized  by FDA to treat people with COVID-19. Your healthcare provider may talk with you about clinical trials for which you may be eligible. It is your choice to be treated or not to be treated with PAXLOVID. Should you decide not to receive it or for your child not to receive it, it will not change your standard medical care.  What if I am pregnant or breastfeeding? There is no experience treating pregnant women or breastfeeding mothers with PAXLOVID. For a mother and unborn baby, the benefit of taking PAXLOVID may be greater than the risk from the treatment. If you are pregnant, discuss your options and specific situation with your healthcare provider. It is recommended that you use effective barrier contraception or do not have  sexual activity while taking PAXLOVID. If you are breastfeeding, discuss your options and specific situation with your healthcare provider. 4 Revised: 24 May 2020   How do I report side effects with PAXLOVID? Contact your healthcare provider if you have any side effects that bother you or do not go away. Report side effects to FDA MedWatch at SmoothHits.hu or call 1-800-FDA1088 or you can report side effects to [employer]. at the contact information provided below. Website Fax number Telephone number www.pfizersafetyreporting.com [phone] How should I store [place]? Store PAXLOVID tablets at room temperature, between 68?F to 77?F (20?C to 25?C). How can I learn more about COVID-19? ? Ask your healthcare provider. ? Visit https://jacobson-johnson.com/. ? Contact your local or state public health department. What is an Emergency Use Authorization (EUA)? The [place] FDA has made PAXLOVID available under an emergency access mechanism called an Emergency Use Authorization (EUA). The EUA is supported by a Education officer, museum and Human Service (HHS) declaration that circumstances exist to justify the emergency use of drugs and biological products during the COVID-19 pandemic. PAXLOVID for the treatment of mild-to-moderate COVID-19 in adults and children [56 years of age and older weighing at least 36 pounds (67 kg)] with positive results of direct SARS-CoV-2 viral testing, and who are at high risk for progression to severe COVID-19, including hospitalization or death, has not undergone the same type of review as an FDA-approved product. In issuing an EUA under the KPVVZ-48 public health emergency, the FDA has determined, among other things, that based on the total amount of scientific evidence available including data from adequate and well-controlled clinical trials, if available, it is reasonable to believe that the product may be effective for  diagnosing, treating, or preventing COVID-19, or a serious or life-threatening disease or condition caused by COVID-19; that the known and potential benefits of the product, when used to diagnose, treat, or prevent such disease or condition, outweigh the known and potential risks of such product; and that there are no adequate, approved, and available alternatives. All of these criteria must be met to allow for the product to be used in the treatment of patients during the COVID-19 pandemic. The EUA for PAXLOVID is in effect for the duration of the COVID-19 declaration justifying emergency use of this product, unless terminated or revoked (after which the products may no longer be used under the EUA). 5 Revised: 24 May 2020     Additional Information For general questions, visit the website or call the telephone number provided below. Website Telephone number www.COVID19oralRx.com [phone] (1-877-C19-PACK) You can also go to www.pfizermedinfo.com or call [phone] for more information. [id number] Revised: 24 May 2020

## 2021-04-15 NOTE — Progress Notes (Signed)
Virtual Visit via Telephone Note  I connected with Debra [name] on 04/15/21 at  1:20 PM EST by telephone and verified that I am speaking with the correct person using two identifiers.   I discussed the limitations of performing an evaluation and management service by telephone and requested permission for a phone visit. The patient expressed understanding and agreed to proceed.  [name] patient:  Debra [name] provider: work or home office Participants present for the call: patient, provider Patient did not have a visit with me in the prior 7 days to address this/these issue(s).   History of Present Illness:  Acute telemedicine visit for Covid19: -Onset: 3 days ago; tested positive for covid today -husband has covid -Symptoms include: fevers, chills, cough, body aches, mild headache -Denies: CP, SOB, NVD -drinking fluids, able to get out of bed, feeling a little better today -Pertinent past medical history: see below, has not had covid before -GFR was 89 in august -Pertinent medication allergies:  Allergies  Allergen Reactions   Codeine Anaphylaxis and Shortness Of Breath   Opium Anaphylaxis   Oxycodone Shortness Of Breath   Penicillins Anaphylaxis and Shortness Of Breath   Azithromycin Diarrhea    Pt request not to take   -COVID-19 vaccine status: 2 doses and 3 boosters Immunization History  Administered Date(s) Administered   Fluad Quad(high Dose 65+) 11/03/2018   Influenza Split 12/16/2013   Influenza, High Dose Seasonal PF 11/29/2016, 12/26/2017, 11/21/2019   Influenza,inj,Quad PF,6+ Mos 12/24/2014, 12/02/2015   Influenza-Unspecified 11/29/2016, 11/26/2020   PFIZER Comirnaty(Gray Top)Covid-19 Tri-Sucrose Vaccine 06/17/2020   PFIZER(Purple Top)SARS-COV-2 Vaccination 03/28/2019, 04/18/2019, 12/03/2019   Pneumococcal Conjugate-13 04/07/2016   Pneumococcal Polysaccharide-23 03/10/2017   Tdap 06/04/2017      Past Medical History:  Diagnosis Date   Aortic  aneurysm ([place])    Arthritis    "moderate in back" (11/15/2014)   Bicuspid aortic valve    mild AS by echo 12/2016   Carcinoid tumor of small intestine, 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   Colon cancer ([place])    NEUROENDOCRINE    Coronary artery disease    nonobstructive   H/O echocardiogram    bicuspid aortic valve with moderate aortic valve sclerosis,mild PR.TR.MR and mildly dilated aorta by echo on 8.2012   Heart murmur    History of kidney stones    Hypertension    Iron deficiency anemia    "can't tolerate the supplements right now" (11/15/2014)   Mild mitral regurgitation    PONV (postoperative nausea and vomiting)    PREOP YEARS AGO WITH HYSTERECTOMY FELT " CREEPY" PRIOR TO SURGERY    Thoracic aortic aneurysm    mild, 4.1 cm by recent CT followed by Dr Darcey Nora    Current Outpatient Medications on File Prior to Visit  Medication Sig Dispense Refill   aspirin (ASPIRIN 81) 81 MG chewable tablet Chew 1 tablet (81 mg total) by mouth 2 (two) times daily after a meal. (Patient taking differently: Chew 81 mg by mouth daily.) 30 tablet 0   losartan (COZAAR) 25 MG tablet Take 1 tablet (25 mg total) by mouth daily. Please keep upcoming appt in January 2023 with Dr. Radford Pax before anymore refills. Thank you 90 tablet 0   metoprolol succinate (TOPROL-XL) 50 MG 24 hr tablet TAKE 1 TABLET BY MOUTH DAILY *DO NOT CRUSH* (BETA BLOCKER) PLEASE KEEP UPCOMING APPT IN 01/23 90 tablet 0   simvastatin (ZOCOR) 20 MG tablet TAKE 1 TABLET BY MOUTH DAILY AT 6 PM. 90 tablet 2  fluticasone (FLONASE) 50 MCG/ACT nasal spray Place 1 spray into both nostrils 2 (two) times daily. 16 g 3   No current facility-administered medications on file prior to visit.    Observations/Objective: Patient sounds cheerful and well on the phone. I do not appreciate any SOB. Speech and thought processing are grossly intact. Patient reported vitals:  Assessment and Plan:  COVID-19   Discussed treatment options and risk of drug  interactions, ideal treatment window, potential complications, isolation and precautions for COVID-19.  Discussed possibility of rebound with antivirals and the need to reisolate if it should occur for 5 days. Checked for/reviewed any labs done in the last 90 days with GFR listed in HPI if available.  After lengthy discussion, the patient opted for treatment with Paxlovid due to being higher risk for complications of covid or severe disease and other factors. Discussed EUA status of this drug and the fact that there is preliminary limited knowledge of risks/interactions/side effects per EUA document vs possible benefits and precautions. She agrees to hold her statin for 8 days. This information was shared with patient during the visit and also was provided in patient instructions.  Advised to seek prompt virtual visit or in person care if worsening, new symptoms arise, or if is not improving with treatment as expected per our conversation of expected course. Discussed options for follow up care.  I discussed the assessment and treatment plan with the patient. The patient was provided an opportunity to ask questions and all were answered. The patient agreed with the plan and demonstrated an understanding of the instructions.    Follow Up Instructions:  I did not refer this patient for an OV with me in the next 24 hours for this/these issue(s).  I discussed the assessment and treatment plan with the patient. The patient was provided an opportunity to ask questions and all were answered. The patient agreed with the plan and demonstrated an understanding of the instructions.   I spent 18 minutes on the date of this visit in the care of this patient. See summary of tasks completed to properly care for this patient in the detailed notes above which also included counseling of above, review of PMH, medications, allergies, evaluation of the patient and ordering and/or  instructing patient on testing and care  options.     Debra Kern, DO

## 2021-04-16 ENCOUNTER — Telehealth: Payer: Self-pay | Admitting: Family Medicine

## 2021-04-16 NOTE — Telephone Encounter (Signed)
Diarrhea and other GI symptoms are some of side effects of Paxlovid. If diarrhea is severe, she needs to discontinue medication and continue symptomatic treatment. If mild , she could try to complete treatment, taking it with food may also help. Thanks, BJ

## 2021-04-16 NOTE — Telephone Encounter (Signed)
Patient was seen virtually with Dr.Kim on 02/07 due to her testing positive for covid. Patient stated that she was prescribed nirmatrelvir/ritonavir EUA (PAXLOVID) 20 x 150 MG & 10 x 100MG  TABS [[id number]] and have been taking it but it has now caused a reaction. Patient wants to know what she should do now.  Patient could be contacted at [phone] or [phone].  Please advise.

## 2021-04-16 NOTE — Telephone Encounter (Signed)
Pt is experiencing diarrhea after taking medication. Started last PM & has continued through to this morning. Pt is holding medication at this time. States she has intermittent diarrhea when starting new meds & just wants to know if she can continue taking Paxlovid with Immodium or discontinue.

## 2021-04-17 NOTE — Telephone Encounter (Signed)
Pt states she never d/c'd medication - after some thought she decided to "tough it out". States diarrhea is intermittent & mild; feels like she's getting better. Advised of PCP response & verb understanding. Denies further ques/concerns at this time.

## 2021-05-09 ENCOUNTER — Telehealth: Payer: Self-pay | Admitting: Family Medicine

## 2021-05-09 NOTE — Telephone Encounter (Signed)
Left message for patient to call back and schedule Medicare Annual Wellness Visit (AWV) either virtually or in office. Left  my Herbie Drape number [phone] ? ? ?Last AWV 06/12/20 ?; please schedule at anytime with [facility] Nurse Health Advisor 1 or 2 ? ? ?This should be a 45 minute visit.  ?

## 2021-05-12 NOTE — Telephone Encounter (Signed)
Returned patients call ? ?Pt returned my call ?

## 2021-05-13 ENCOUNTER — Telehealth: Payer: Self-pay | Admitting: *Deleted

## 2021-05-13 NOTE — Telephone Encounter (Signed)
Left VM for patient to call back. RNCM wanted to check status after 1 year post op for L-THA.  ?

## 2021-05-17 ENCOUNTER — Other Ambulatory Visit: Payer: Self-pay | Admitting: Cardiology

## 2021-05-20 ENCOUNTER — Telehealth: Payer: Self-pay | Admitting: *Deleted

## 2021-05-20 NOTE — Telephone Encounter (Signed)
Ortho bundle 1 year call to patient attempted x2. No answer and left VM.  ?

## 2021-05-28 ENCOUNTER — Telehealth: Payer: Self-pay | Admitting: Cardiology

## 2021-05-28 NOTE — Telephone Encounter (Signed)
New Message: ? ? ? ? ?Patient would like to change from Dr Radford Pax to Dr Harrell Gave please. She said [facility] is much closer to her home. Is this alright with both of you? ?

## 2021-06-02 NOTE — Telephone Encounter (Signed)
Ok by me

## 2021-06-20 DIAGNOSIS — H11441 Conjunctival cysts, right eye: Secondary | ICD-10-CM | POA: Diagnosis not present

## 2021-06-24 ENCOUNTER — Telehealth: Payer: Self-pay

## 2021-06-24 NOTE — Telephone Encounter (Signed)
This nurse attempted to call patient in regards to missed AWV. Unable to leave a message. ?

## 2021-06-25 ENCOUNTER — Telehealth: Payer: Self-pay | Admitting: Family Medicine

## 2021-06-25 NOTE — Telephone Encounter (Signed)
Left message for patient to call back and schedule Medicare Annual Wellness Visit (AWV) either virtually or in office. Left  my jabber number [phone]   Last AWV ;06/12/20  please schedule at anytime with [facility] Nurse Health Advisor 1 or 2    

## 2021-07-07 ENCOUNTER — Telehealth: Payer: Self-pay | Admitting: Family Medicine

## 2021-07-07 ENCOUNTER — Ambulatory Visit (HOSPITAL_BASED_OUTPATIENT_CLINIC_OR_DEPARTMENT_OTHER): Payer: Medicare PPO | Admitting: Cardiology

## 2021-07-07 VITALS — BP 130/72 | HR 60 | Ht 62.0 in | Wt 128.8 lb

## 2021-07-07 DIAGNOSIS — Q231 Congenital insufficiency of aortic valve: Secondary | ICD-10-CM

## 2021-07-07 DIAGNOSIS — I7121 Aneurysm of the ascending aorta, without rupture: Secondary | ICD-10-CM

## 2021-07-07 DIAGNOSIS — E7800 Pure hypercholesterolemia, unspecified: Secondary | ICD-10-CM

## 2021-07-07 DIAGNOSIS — I10 Essential (primary) hypertension: Secondary | ICD-10-CM | POA: Diagnosis not present

## 2021-07-07 DIAGNOSIS — I2510 Atherosclerotic heart disease of native coronary artery without angina pectoris: Secondary | ICD-10-CM | POA: Diagnosis not present

## 2021-07-07 MED ORDER — METOPROLOL SUCCINATE ER 50 MG PO TB24: 50.0000 mg | ORAL_TABLET | Freq: Every day | ORAL | 3 refills | Status: DC

## 2021-07-07 MED ORDER — SIMVASTATIN 20 MG PO TABS: ORAL_TABLET | ORAL | 3 refills | Status: DC

## 2021-07-07 MED ORDER — LOSARTAN POTASSIUM 25 MG PO TABS: 25.0000 mg | ORAL_TABLET | Freq: Every day | ORAL | 3 refills | Status: DC

## 2021-07-07 NOTE — Progress Notes (Signed)
?Cardiology Office Note:   ? ?Date:  07/14/2021  ? ?ID:  Debra [name], DOB 1950/04/14, MRN [id number] ? ?PCP:  Martinique, Betty G, MD  ?Cardiologist:  Buford Dresser, MD ? ?Referring MD: Martinique, Betty G, MD  ? ?CC: establish care ? ?History of Present Illness:   ? ?Debra [name] is a 71 y.o. female with a hx of TAA, bicuspid aortic valve with mild-moderate stenosis, nonobstructive CAD, hypertension, dyslipidemia who is seen for follow up today. She is a new patient to me, prior patient of Dr. Radford Pax, last seen for preop clearance by Kathyrn Drown 11/2019. ? ?Cardiac history: ?TAA: 41 cm in 2017, has seen Dr. Darcey Nora ?Nonobstructive CAD: cath 2009 as below. Widely patent cors except for 40-50% ostial LM ?Hypertension ?Bicuspid aortic valve: last echo 2021, mild-moderate stenosis (mean gradient 14 mmHg, DI 0.24, AVA 0.77) ?Hypercholesterolemia ? ?Today: ?Feeling great. Asking about her current medications, should we continue them? Is she due for imaging? Does she need to keep seeing a CT surgeon? ? ?Prefers minimal medication. Reviewed her medications together today. She did have lightheadedness on and off at home. Rarely checks her BP at home. Has done better since starting the losartan. ? ?Follows with Dr. Benay Spice for neuroendocrine cancer history. ? ?Denies chest pain, shortness of breath at rest or with normal exertion. No PND, orthopnea, LE edema or unexpected weight gain. No syncope or palpitations.  ? ?Per [facility]: ?Lipids Tchol 151, HDL 68, LDL 68, TG 75 ? ?Can climb flight of stairs without limitation. Vegetarian for 38 years. Drinks 1 glass of wine 5 nights/week and a cocktail one night/week. ? ?Past Medical History:  ?Diagnosis Date  ? Aortic aneurysm ([place])   ? Arthritis   ? "moderate in back" (11/15/2014)  ? Bicuspid aortic valve   ? mild AS by echo 12/2016  ? 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 Colon cancer ([place])   ? NEUROENDOCRINE   ? Coronary artery  disease   ? nonobstructive  ? H/O echocardiogram   ? bicuspid aortic valve with moderate aortic valve sclerosis,mild PR.TR.MR and mildly dilated aorta by echo on 8.2012  ? Heart murmur   ? History of kidney stones   ? Hypertension   ? Iron deficiency anemia   ? "can't tolerate the supplements right now" (11/15/2014)  ? Mild mitral regurgitation   ? PONV (postoperative nausea and vomiting)   ? PREOP YEARS AGO WITH HYSTERECTOMY FELT " CREEPY" PRIOR TO SURGERY   ? Thoracic aortic aneurysm ([place])   ? mild, 4.1 cm by recent CT followed by Dr Darcey Nora  ? ? ?Past Surgical History:  ?Procedure Laterality Date  ? ABDOMINAL HYSTERECTOMY    ? BOWEL RESECTION N/A 11/15/2014  ? Procedure: SMALL BOWEL RESECTION;  Surgeon: [name], MD;  Location: [facility];  Service: General;  Laterality: N/A;  ? CATARACT EXTRACTION W/ INTRAOCULAR LENS IMPLANT    ? COLON SURGERY    ? COLONOSCOPY  10/15/11  ? diverticulosis present, mild severity, no polyps, repeat in 10 years.  ? LAPAROSCOPIC LYSIS OF ADHESIONS N/A 05/14/2014  ? Procedure: LAPAROSCOPIC LYSIS OF ADHESIONS x 38mns;  Surgeon: BGeorganna Skeans MD;  [facility]  Service: General;  Laterality: N/A;  ? LAPAROSCOPIC SMALL BOWEL RESECTION N/A 05/14/2014  ? Procedure: LAPAROSCOPIC ASSISTED SMALL BOWEL RESECTION;  Surgeon: BGeorganna Skeans MD;  Location[facility]  Service: General;  Laterality: N/A;  ? SMALL INTESTINE SURGERY  11/15/2014  ? TONSILLECTOMY    ?  TOTAL HIP ARTHROPLASTY Left 12/08/2019  ? Procedure: LEFT TOTAL HIP ARTHROPLASTY ANTERIOR APPROACH;  Surgeon: Mcarthur Rossetti, MD;  Location: [facility];  Service: Orthopedics;  Laterality: Left;  ? ? ?Current Medications: ?Current Outpatient Medications on File Prior to Visit  ?Medication Sig  ? aspirin EC 81 MG tablet Take 81 mg by mouth daily. Swallow whole.  ? ?No current facility-administered medications on file prior to visit.  ?  ? ?Allergies:   Codeine, Opium, Oxycodone, Penicillins, and Azithromycin  ? ?Social History  ? ?Tobacco  Use  ? Smoking status: Never  ? Smokeless tobacco: Never  ?Vaping Use  ? Vaping Use: Never used  ?Substance Use Topics  ? Alcohol use: Yes  ?  Alcohol/week: 4.0 standard drinks  ?  Types: 4 Glasses of wine per week  ?  Comment: 5 GLASSES OF WINE PER WEEK   ? Drug use: No  ? ? ?Family History: ?family history includes Breast cancer in her mother; CAD in her maternal grandfather, paternal grandfather, and paternal grandmother; Cancer in her father; Diabetes in her father; Heart attack in her paternal grandmother. There is no history of Stroke or Hypertension. ? ?ROS:   ?Please see the history of present illness.  Additional pertinent ROS otherwise unremarkable. ? ?EKGs/Labs/Other Studies Reviewed:   ? ?The following studies were reviewed today: ?Echo  ? 1. Left ventricular ejection fraction, by estimation, is 55 to 60%. The  ?left ventricle has normal function. The left ventricle has no regional  ?wall motion abnormalities. Left ventricular diastolic parameters are  ?indeterminate.  ? 2. Right ventricular systolic function is normal. The right ventricular  ?size is normal.  ? 3. The mitral valve is grossly normal. Mild to moderate mitral valve  ?regurgitation. No evidence of mitral stenosis.  ? 4. The aortic valve is bicuspid. Aortic valve regurgitation is trivial.  ?Mild aortic valve stenosis.  ? 5. Aortic dilatation noted. There is moderate dilatation of the ascending  ?aorta, measuring 41 mm.  ? ?LV SV:         45 ?AV Area (Vmax):    0.79 cm?  ?AV Area (Vmean):   0.77 cm?  ?AV Area (VTI):     0.75 cm?  ?AV Vmax:           238.40 cm/s  ?AV Vmean:          177.800 cm/s  ?AV VTI:            0.607 m  ?AV Peak Grad:      22.7 mmHg  ?AV Mean Grad:      14.2 mmHg  ?LVOT Vmax:         60.00 cm/s  ?LVOT Vmean:        43.450 cm/s  ?LVOT VTI:          0.145 m  ?LVOT/AV VTI ratio: 0.24  ? ?MR angiogram 03/29/2018 ?Maximal diameter of the ascending aorta at the sinus of Valsalva, ?sino-tubular junction, and ascending aorta are  3.0 cm, 2.7 cm and ?4.1 cm. This compares with 4.1 cm in the ascending aorta on the ?prior study. There is no evidence of aortic dissection or acute ?intramural hematoma. The great vessels are patent within the ?confines of the examination. The descending thoracic aorta is ?nonaneurysmal. ? ?Cath 04/07/2007 ? RESULTS: ? 1. The left main coronary is widely patent and bifurcates into a left ?     anterior descending artery and left circumflex artery. ? 2. The left anterior descending artery  has an ostial 40-50% narrowing ?     but then is widely patent throughout its course to the apex.  It ?     gives rise to two diagonal branches, both of which are widely ?     patent. ? 3. The left circumflex is widely patent throughout its course giving ?     rise to four obtuse marginal branches, which are rather large and ?     widely patent. ? 4. The right coronary artery is widely patent throughout its course ?     across the inferior wall.  It bifurcates into a posterior ?     descending artery and posterolateral artery, both of which are ?     widely patent ?  ? Left ventriculography showed normal LV function, EF 60%, left ? ventricular pressure was 137/-12 mmHg.  Aortic pressure 157/57 mmHg. ? LVEDP was 11 mmHg. ?  ? Aortography showed an aortic root aneurysm of approximately 4.5 cm. ?  ? ASSESSMENT: ? 1. Nonobstructive coronary disease. ? 2. Normal left ventricular function. ? 3. Hypertension. ? 4. Dilated aortic root. ? ?EKG:  EKG is personally reviewed.   ?07/07/21: NSR at 60 bpm, RBBB ? ?Recent Labs: ?10/16/2020: ALT 10; BUN 15; Creatinine, Ser 0.62; Potassium 5.0; Sodium 143  ?Recent Lipid Panel ?   ?Component Value Date/Time  ? CHOL 151 10/16/2020 1006  ? CHOL 170 03/01/2019 1100  ? CHOL 141 02/19/2014 0740  ? TRIG 75.0 10/16/2020 1006  ? TRIG 92 02/19/2014 0740  ? HDL 68.70 10/16/2020 1006  ? HDL 78 03/01/2019 1100  ? HDL 69 02/19/2014 0740  ? CHOLHDL 2 10/16/2020 1006  ? VLDL 15.0 10/16/2020 1006  ? Carter 68  10/16/2020 1006  ? LDLCALC 77 03/01/2019 1100  ? Henry 54 02/19/2014 0740  ? ? ?Physical Exam:   ? ?VS:  BP 130/72 (BP Location: Left Arm, Patient Position: Sitting, Cuff Size: Normal)   Pulse 60   Ht '5\' 2"'$  (1.57

## 2021-07-07 NOTE — Patient Instructions (Signed)
Medication Instructions:  ?Your Physician recommend you continue on your current medication as directed.   ? ?*If you need a refill on your cardiac medications before your next appointment, please call your pharmacy* ? ? ?Lab Work: ?None ordered today ? ? ?Testing/Procedures: ?Your physician has requested that you have an echocardiogram in 1 year. Echocardiography is a painless test that uses sound waves to create images of your heart. It provides your doctor with information about the size and shape of your heart and how well your heart?s chambers and valves are working. This procedure takes approximately one hour. There are no restrictions for this procedure. ?[place] ? ? ? ?Follow-Up: ?At [facility], you and your health needs are our priority.  As part of our continuing mission to provide you with exceptional heart care, we have created designated Provider Care Teams.  These Care Teams include your primary Cardiologist (physician) and Advanced Practice Providers (APPs -  Physician Assistants and Nurse Practitioners) who all work together to provide you with the care you need, when you need it. ? ?We recommend signing up for the patient portal called "MyChart".  Sign up information is provided on this After Visit Summary.  MyChart is used to connect with patients for Virtual Visits (Telemedicine).  Patients are able to view lab/test results, encounter notes, upcoming appointments, etc.  Non-urgent messages can be sent to your provider as well.   ?To learn more about what you can do with MyChart, go to [email or web address].   ? ?Your next appointment:   ?1 year(s) ? ?The format for your next appointment:   ?In Person ? ?Provider:   ?Buford Dresser, MD{ ? ? ?Important Information About Sugar ? ? ? ? ? ? ?

## 2021-07-07 NOTE — Telephone Encounter (Signed)
Left message for patient to call back and schedule Medicare Annual Wellness Visit (AWV) either virtually or in office. Left  my jabber number [phone]   Last AWV ;06/12/20  please schedule at anytime with [facility] Nurse Health Advisor 1 or 2    

## 2021-07-14 ENCOUNTER — Encounter (HOSPITAL_BASED_OUTPATIENT_CLINIC_OR_DEPARTMENT_OTHER): Payer: Self-pay | Admitting: Cardiology

## 2021-07-17 ENCOUNTER — Ambulatory Visit: Payer: Medicare PPO | Admitting: Cardiology

## 2021-08-12 ENCOUNTER — Telehealth: Payer: Self-pay | Admitting: Family Medicine

## 2021-08-12 NOTE — Telephone Encounter (Signed)
Left message for patient to call back and schedule Medicare Annual Wellness Visit (AWV) either virtually or in office. Left  my jabber number [phone]   Last AWV ;06/12/20  please schedule at anytime with [facility] Nurse Health Advisor 1 or 2    

## 2021-08-28 ENCOUNTER — Other Ambulatory Visit: Payer: Self-pay | Admitting: Family Medicine

## 2021-08-28 DIAGNOSIS — Z1231 Encounter for screening mammogram for malignant neoplasm of breast: Secondary | ICD-10-CM

## 2021-09-17 ENCOUNTER — Telehealth: Payer: Self-pay | Admitting: Family Medicine

## 2021-09-17 NOTE — Telephone Encounter (Signed)
Left message for patient to call back and schedule Medicare Annual Wellness Visit (AWV) either virtually or in office. Left  my Herbie Drape number [phone]   Last AWV 06/12/20 ; please schedule at anytime with [facility] Nurse Health Advisor 1 or 2

## 2021-09-21 IMAGING — MR MR LUMBAR SPINE W/O CM
4 of 5 series · 25 of 48 positions shown · non-contrast
Comparison: Lumbar radiographs 06/13/2004.

CLINICAL DATA: 68-year-old female with low back pain radiating to
the left lower extremity. No known injury. History of neuroendocrine
tumor.

EXAM:
MRI LUMBAR SPINE WITHOUT CONTRAST
TECHNIQUE: Multiplanar, multisequence MR imaging of the lumbar spine was
performed. No intravenous contrast was administered.

[Series 4: T1 · sagittal · 4.0mm · 0.55mm/px · 6 of 13 slices shown (1 of 2)]
[im 1/13]
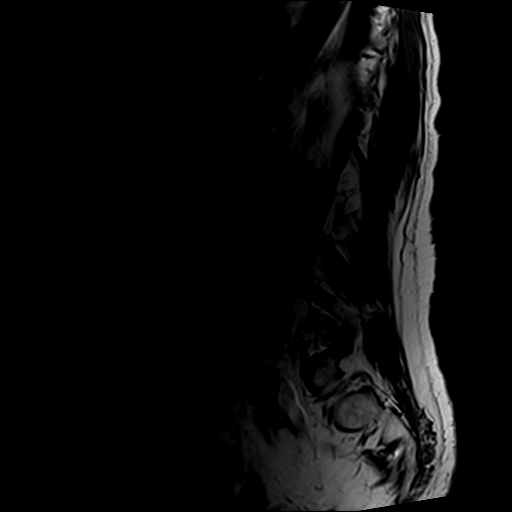
[im 3/13]
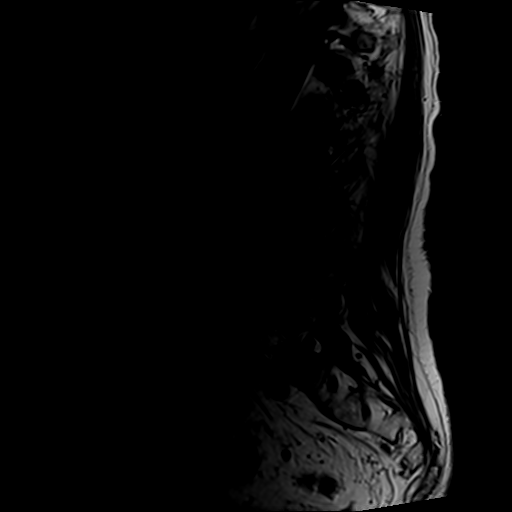
[im 5/13]
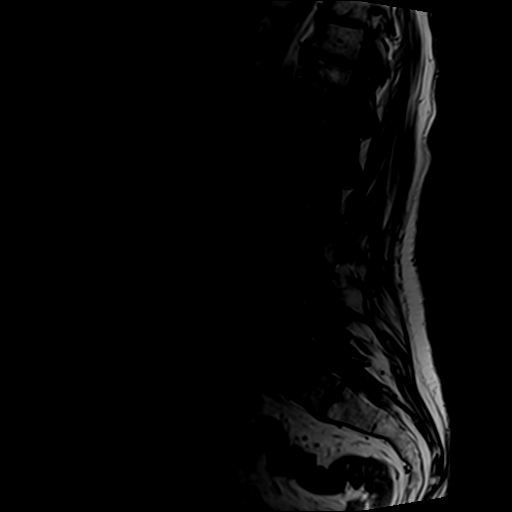
[im 8/13]
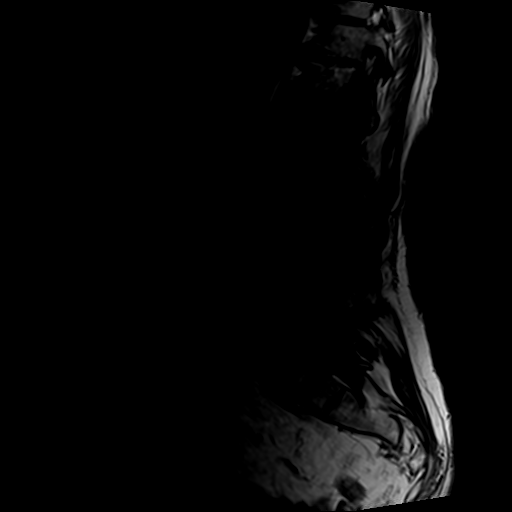
[im 10/13]
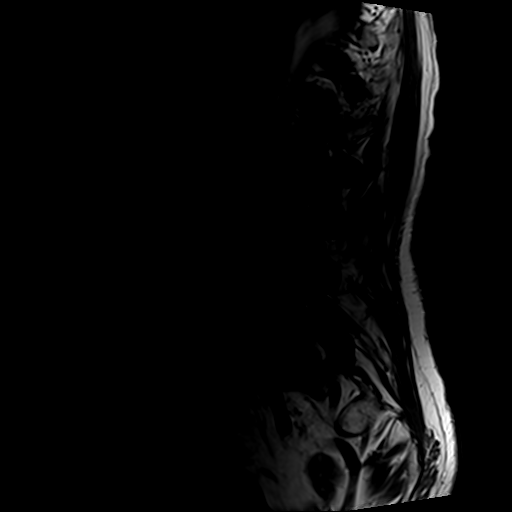
[im 13/13]
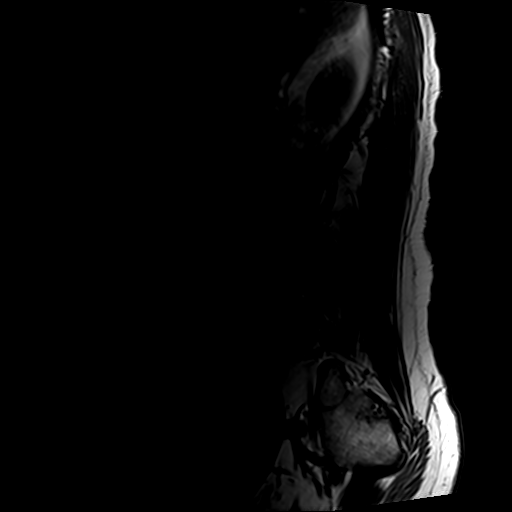

[Series 5: T2 post-contrast · sagittal · 4.0mm · 0.55mm/px · 6 of 13 slices shown]
[im 1/13]
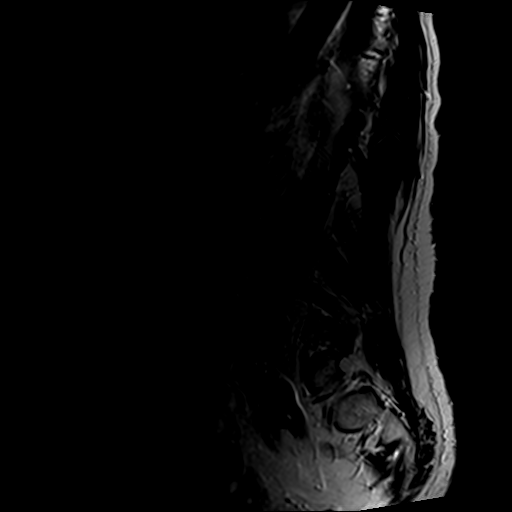
[im 3/13]
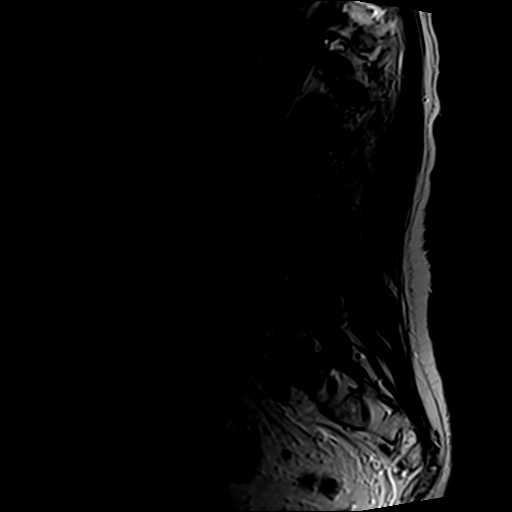
[im 5/13]
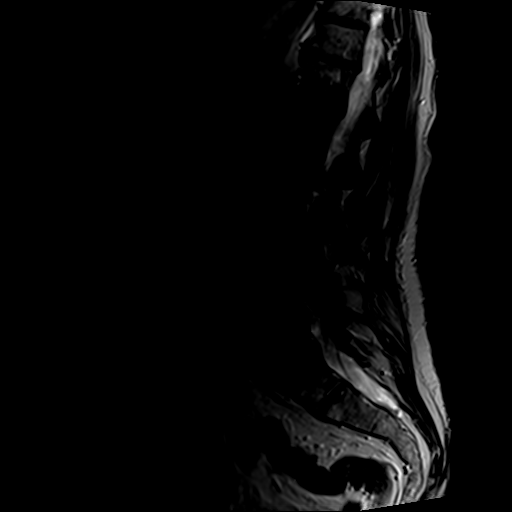
[im 8/13]
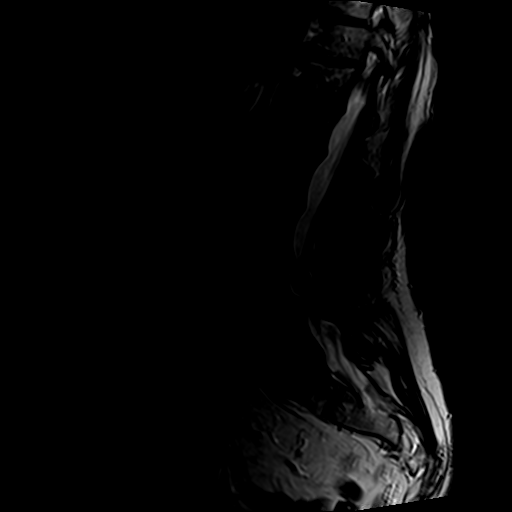
[im 10/13]
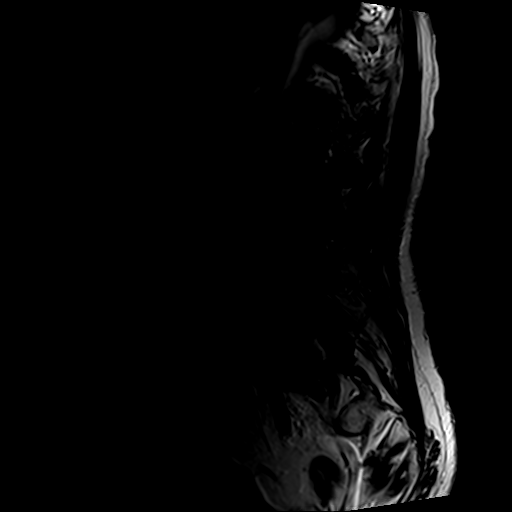
[im 13/13]
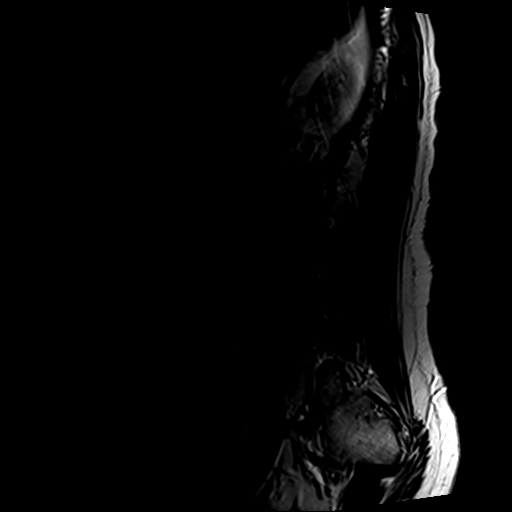

[Series 6: T2 · axial · 4.0mm · 0.70mm/px · z∈[-38,+152]mm · 9 of 34 slices shown]
[im 1/34]
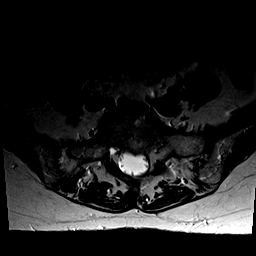
[im 5/34]
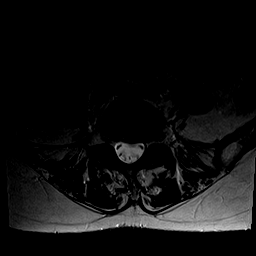
[im 10/34]
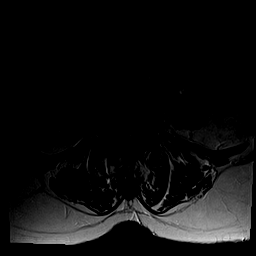
[im 15/34]
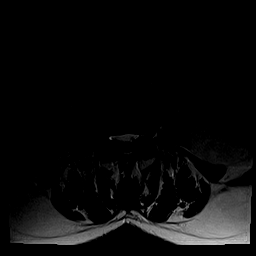
[im 17/34]
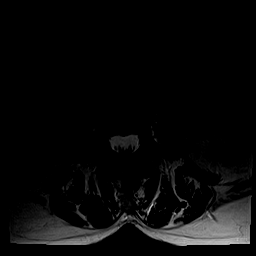
[im 19/34]
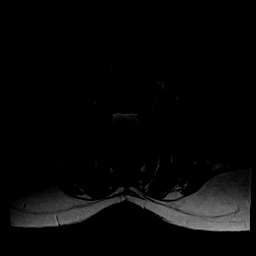
[im 24/34]
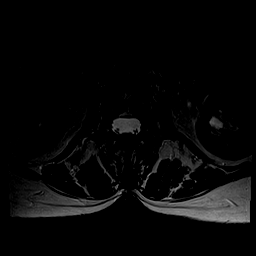
[im 29/34]
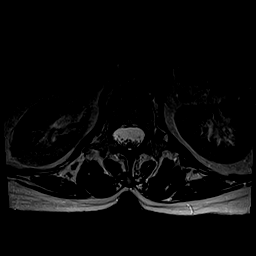
[im 34/34]
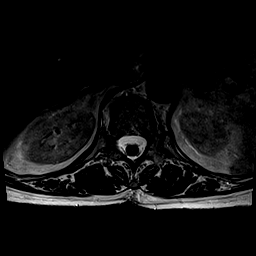

[Series 7: T1 · axial · 4.0mm · 0.35mm/px · z∈[-38,+126]mm · 4 of 34 slices shown (2 of 2)]
[im 1/34]
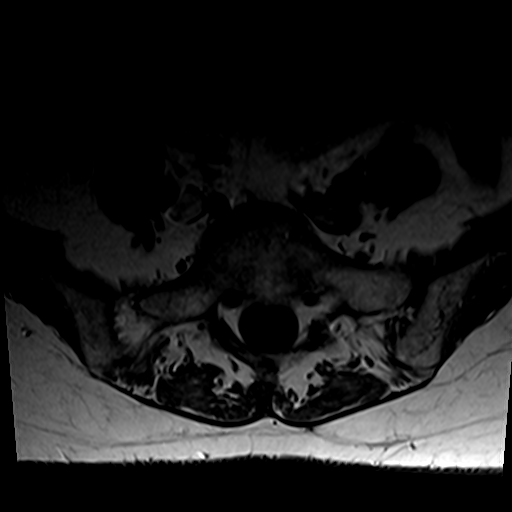
[im 5/34]
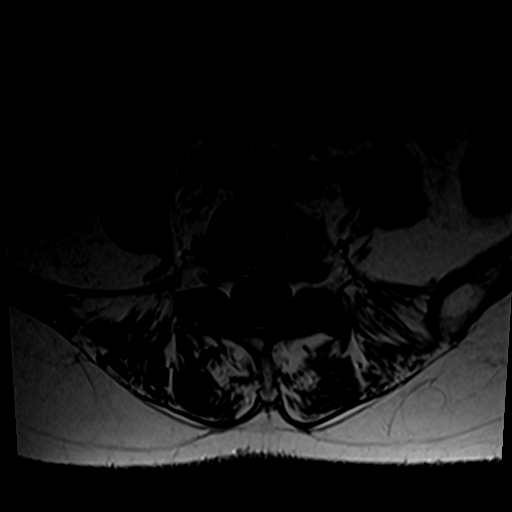
[im 17/34]
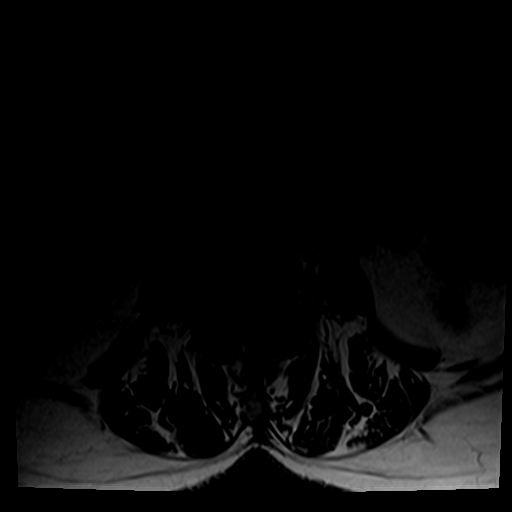
[im 29/34]
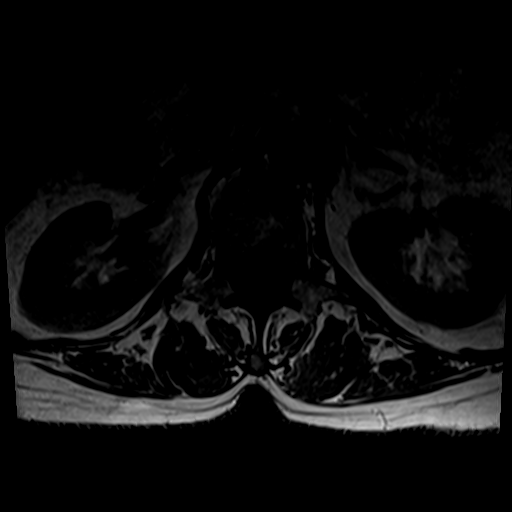

[25 of 48 positions shown; findings below may reference images not displayed]

FINDINGS: Segmentation: Normal on the comparison radiographs, vestigial S1-S2
disc space.

Alignment: Chronic lower lumbar spondylolisthesis. Mild
retrolisthesis of L5 on S1 is stable since 6551. Anterolisthesis of
L4 on L5 has increased and measures up to 7 mm.

Vertebrae: No marrow edema or evidence of acute osseous abnormality.
Visualized bone marrow signal is within normal limits. Intact
visible sacrum and SI joints.

Conus medullaris and cauda equina: Conus extends to the T12-L1
level. No lower spinal cord or conus signal abnormality.

Paraspinal and other soft tissues: Negative.

Disc levels:

Chronic lower thoracic disc degeneration with disc desiccation and
mild disc bulging. No lower thoracic spinal stenosis.

L1-L2:  Tiny central disc bulge or protrusion.  No stenosis.

L2-L3: Minimal disc bulge. Mild posterior element hypertrophy
greater on the right. No stenosis.

L3-L4: Disc space loss. Circumferential disc bulge and moderate
posterior element hypertrophy. Borderline to mild spinal and
bilateral L3 foraminal stenosis.

L4-L5: Anterolisthesis with disc space loss. Circumferential
disc/pseudo disc. Severe posterior element hypertrophy. Moderate to
severe spinal stenosis, with lateral recess stenosis greater on the
left (left L5 nerve level). Moderate to severe bilateral L4
foraminal stenosis, also probably greater on the left.

L5-S1:  Minimal disc bulge.  Mild facet hypertrophy.  No stenosis.
IMPRESSION: 1. Grade 1 spondylolisthesis at L4-L5 has progressed since 6551 with
multifactorial moderate to severe spinal, lateral recess, and
foraminal stenosis. Query left L4 and/or L5 radiculitis.

2. Comparatively mild lumbar spine degeneration elsewhere.
Borderline to mild spinal and foraminal stenosis at L3-L4.

## 2021-09-23 ENCOUNTER — Ambulatory Visit
Admission: RE | Admit: 2021-09-23 | Discharge: 2021-09-23 | Disposition: A | Discharge status: Home / Self Care | Payer: Medicare PPO | Source: Ambulatory Visit | Attending: Family Medicine | Admitting: Family Medicine

## 2021-09-23 DIAGNOSIS — Z1231 Encounter for screening mammogram for malignant neoplasm of breast: Secondary | ICD-10-CM | POA: Diagnosis not present

## 2021-10-29 ENCOUNTER — Telehealth: Payer: Self-pay | Admitting: Family Medicine

## 2021-10-29 NOTE — Telephone Encounter (Signed)
Spoke with patient to schedule AWV. Patient didn't want to schedule at this time and would like a call back 06/2022.

## 2021-11-04 NOTE — Progress Notes (Signed)
ACUTE VISIT Chief Complaint  Patient presents with   skin issues    Lumps on fingers & a spot on her back   HPI: [name] is a 71 y.o. female with medical hx significant for primary carcinoid tumor of small intestine, CAD,HLD,and ascending aortic aneurysm  here today complaining of a few skin lesions, she has had for a while on fingers ("bumps). Lesion on nose bridge she had before and has been treated by dermatologist with liquid nitrogen, more visible sometimes. Hyperpigmented lesions on face and red lesions scattered on her body. Lesions are not render and pruritic. No easy bleeding or significant changes.  She also has some questions about colonoscopy, she received a call from Dr Perley Jain office to let her know she was due. She would like to have it done by a different provider.  Review of Systems  Constitutional:  Negative for activity change, appetite change, chills and fever.  HENT:  Negative for mouth sores and sore throat.   Respiratory:  Negative for cough and shortness of breath.   Gastrointestinal:  Negative for abdominal pain, nausea and vomiting.  Hematological:  Negative for adenopathy. Does not bruise/bleed easily.  Rest see pertinent positives and negatives per HPI.  Current Outpatient Medications on File Prior to Visit  Medication Sig Dispense Refill   aspirin EC 81 MG tablet Take 81 mg by mouth daily. Swallow whole.     losartan (COZAAR) 25 MG tablet Take 1 tablet (25 mg total) by mouth daily. 90 tablet 3   metoprolol succinate (TOPROL-XL) 50 MG 24 hr tablet Take 1 tablet (50 mg total) by mouth daily. 90 tablet 3   simvastatin (ZOCOR) 20 MG tablet TAKE 1 TABLET BY MOUTH DAILY AT 6 PM. 90 tablet 3   No current facility-administered medications on file prior to visit.   Past Medical History:  Diagnosis Date   Aortic aneurysm ([place])    Arthritis    "moderate in back" (11/15/2014)   Bicuspid aortic valve    mild AS by echo 12/2016   Carcinoid  tumor of small intestine, malignant ([place]) dx'd 05/2014   Colon cancer ([place])    NEUROENDOCRINE    Coronary artery disease    nonobstructive   H/O echocardiogram    bicuspid aortic valve with moderate aortic valve sclerosis,mild PR.TR.MR and mildly dilated aorta by echo on 8.2012   Heart murmur    History of kidney stones    Hypertension    Iron deficiency anemia    "can't tolerate the supplements right now" (11/15/2014)   Mild mitral regurgitation    PONV (postoperative nausea and vomiting)    PREOP YEARS AGO WITH HYSTERECTOMY FELT " CREEPY" PRIOR TO SURGERY    Thoracic aortic aneurysm ([facility])    mild, 4.1 cm by recent CT followed by Dr Darcey Nora   Allergies  Allergen Reactions   Codeine Anaphylaxis and Shortness Of Breath   Opium Anaphylaxis   Oxycodone Shortness Of Breath   Penicillins Anaphylaxis and Shortness Of Breath   Azithromycin Diarrhea    Pt request not to take    Social History   Socioeconomic History   Marital status: Married    Spouse name: Not on file   Number of children: 2   Years of education: 19   Highest education level: Master's degree (e.g., MA, MS, MEng, MEd, MSW, MBA)  Occupational History   Not on file  Tobacco Use   Smoking status: Never   Smokeless tobacco: Never  Vaping  Use   Vaping Use: Never used  Substance and Sexual Activity   Alcohol use: Yes    Alcohol/week: 4.0 standard drinks of alcohol    Types: 4 Glasses of wine per week    Comment: 5 GLASSES OF WINE PER WEEK    Drug use: No   Sexual activity: Yes  Other Topics Concern   Not on file  Social History Narrative   Married, spouse Emoree Sasaki   Social Determinants of Health   Financial Resource Strain: Unknown (11/04/2021)   Overall Financial Resource Strain (CARDIA)    Difficulty of Paying Living Expenses: Patient refused  Food Insecurity: Unknown (11/04/2021)   Hunger Vital Sign    Worried About Running Out of Food in the Last Year: Patient refused    [place] in the  Last Year: Patient refused  Transportation Needs: No Transportation Needs (11/04/2021)   PRAPARE - Hydrologist (Medical): No    Lack of Transportation (Non-Medical): No  Physical Activity: Unknown (11/04/2021)   Exercise Vital Sign    Days of Exercise per Week: Patient refused    Minutes of Exercise per Session: Not on file  Stress: Unknown (11/04/2021)   [place]    Feeling of Stress : Patient refused  Social Connections: Unknown (11/04/2021)   Social Connection and Isolation Panel [NHANES]    Frequency of Communication with Friends and Family: More than three times a week    Frequency of Social Gatherings with Friends and Family: More than three times a week    Attends Religious Services: Patient refused    Active Member of Clubs or Organizations: Yes    Attends Music therapist: More than 4 times per year    Marital Status: Married   Vitals:   11/05/21 0919  BP: 120/70  Pulse: 69  Resp: 12  Temp: 98.1 F (36.7 C)  SpO2: 99%   Body mass index is 23.25 kg/m.  Physical Exam Vitals and nursing note reviewed.  Constitutional:      General: She is not in acute distress.    Appearance: She is well-developed.  HENT:     Head: Normocephalic and atraumatic.      Mouth/Throat:     Mouth: Mucous membranes are moist.     Pharynx: Oropharynx is clear.  Eyes:     Conjunctiva/sclera: Conjunctivae normal.  Cardiovascular:     Rate and Rhythm: Normal rate and regular rhythm.     Heart sounds: No murmur heard. Pulmonary:     Effort: Pulmonary effort is normal. No respiratory distress.     Breath sounds: Normal breath sounds.  Musculoskeletal:       Hands:  Lymphadenopathy:     Cervical: No cervical adenopathy.  Skin:    General: Skin is warm.     Findings: Lesion present.     Comments: Scattered red, mildly raised rounded lesions, 1-4 mm on UE's and around  abdomen. Hyperpigmented, rough texture lesions on face, area on concerned on left cheek. Small fine scaly area on nose, more palpable than visible, about 2 mm. See HENT.  Neurological:     Mental Status: She is alert and oriented to person, place, and time.  Psychiatric:        Mood and Affect: Mood and affect normal.     Comments: Well groomed, good eye contact.   ASSESSMENT AND PLAN:  [name] was seen today for skin issues.  Diagnoses and all orders for this visit:  AK (actinic keratosis) We discussed dx and prognosis as well as treatment options. After verbal concern lesion on nose and left cheek were treated with liquid nitrogen x 3 spray. Sun screen and avoidance of direct UV exposure. If recurrent, dermatologist evaluation will be necessary.  Seborrheic keratoses Educated about Dx. 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 Reassured.  Wart of hand Small. We discussed treatment options. She would them treated with liquid nitrogen, verbal consent given. Each lesion treated with 3 sprays, tolerated well. She tolerated procedure well. Post procedure instructions given.  Colon cancer screening -     Ambulatory referral to Gastroenterology  Malignant carcinoid tumor of small intestine, unspecified location ([place])  I spent a total of 24 minutes in both face to face and non face to face activities for this visit on the date of this encounter, excluding the time to treat lesions.During this time history was obtained and documented, examination was performed, and assessment/plan discussed.  Return in about 4 months (around 02/27/2022) for cpe.  Algernon Mundie G. Martinique, MD  [facility]. [place] office.

## 2021-11-05 ENCOUNTER — Ambulatory Visit: Payer: Medicare PPO | Admitting: Family Medicine

## 2021-11-05 ENCOUNTER — Encounter: Payer: Self-pay | Admitting: Family Medicine

## 2021-11-05 VITALS — BP 120/70 | HR 69 | Temp 98.1°F | Resp 12 | Ht 62.0 in | Wt 127.1 lb

## 2021-11-05 DIAGNOSIS — L570 Actinic keratosis: Secondary | ICD-10-CM

## 2021-11-05 DIAGNOSIS — L821 Other seborrheic keratosis: Secondary | ICD-10-CM | POA: Diagnosis not present

## 2021-11-05 DIAGNOSIS — B079 Viral wart, unspecified: Secondary | ICD-10-CM

## 2021-11-05 DIAGNOSIS — Z1211 Encounter for screening for malignant neoplasm of colon: Secondary | ICD-10-CM | POA: Diagnosis not present

## 2021-11-05 DIAGNOSIS — C7A019 Malignant carcinoid tumor of the small intestine, unspecified portion: Secondary | ICD-10-CM

## 2021-11-05 NOTE — Patient Instructions (Signed)
A few things to remember from today's visit:  AK (actinic keratosis)  Seborrheic keratoses  Wart of hand  Colon cancer screening - Plan: Ambulatory referral to Gastroenterology  If you need refills please call your pharmacy. Do not use My Chart to request refills or for acute issues that need immediate attention.    Please be sure medication list is accurate. If a new problem present, please set up appointment sooner than planned today.   Actinic Keratosis An actinic keratosis is a precancerous growth on the skin. If there is more than one, the condition is called actinic keratoses. These growths appear most often on parts of the skin that get a lot of sun exposure, including the: Scalp. Face. Ears. Lips. Upper back. Forearms. Backs of the hands. If left untreated, these growths may develop into a skin cancer called squamous cell carcinoma. It is important to have all these growths checked by a health care provider to determine the best treatment. What are the causes? Actinic keratoses are caused by getting too much ultraviolet (UV) radiation from the sun or other UV light sources. What increases the risk? You are more likely to develop this condition if you: Have light-colored skin or blue eyes. Have blond or red hair. Spend a lot of time in the sun. Do not protect your skin from the sun when outdoors. Are an older person. The risk of developing an actinic keratosis increases with age. What are the signs or symptoms? These growths feel like scaly, rough spots of skin. Symptoms of this condition include growths that may: Be as small as a pinhead or as big as a quarter. Itch, hurt, or feel sensitive. Be skin-colored, light tan, dark tan, pink, or a combination of these colors. In most cases, the growths become red. Have a small piece of pink or gray skin (skin tag) growing from them. It may be easier to notice the growths by feeling them rather than seeing them. Sometimes,  actinic keratoses disappear but may return a few days to a few weeks later. How is this diagnosed? This condition is usually diagnosed with a physical exam. A tissue sample may be removed from the growth and examined under a microscope (biopsy). How is this treated? This condition may be treated by: Scraping off the actinic keratosis (curettage). Freezing the actinic keratosis with liquid nitrogen (cryosurgery). This causes the growth to eventually fall off. Applying medicated creams or gels to destroy the cells in the growth. Applying chemicals to the growth to make the outer layers of skin peel off (chemical peel). Using photodynamic therapy. In this procedure, medicated cream is applied to the actinic keratosis. This cream increases your skin's sensitivity to light. Then, a strong light is aimed at the actinic keratosis to destroy cells in the growth. Follow these instructions at home: Skin care Apply cool, wet cloths (coolcompresses) to the affected areas. Do not scratch your skin. Check your skin regularly for any growths, especially ones that: Start to itch or bleed. Change in size, shape, or color. Caring for the treated area Keep the treated area clean and dry as told by your health care provider. Do not apply any medicine, cream, or lotion to the treated area unless your health care provider tells you to do that. Do not pick at blisters or try to break them open. This can cause infection and scarring. If you have red or irritated skin after treatment, follow instructions from your health care provider about how to take care of  the treated area. Make sure you: Wash your hands with soap and water for at least 20 seconds before and after you change your bandage (dressing). If soap and water are not available, use hand sanitizer. Change your dressing as told by your health care provider. If you have red or irritated skin after treatment, check the treated area every day for signs of  infection. Check for: Redness, swelling, or pain. Fluid or blood. Warmth. Pus or a bad smell. Lifestyle Do not use any products that contain nicotine or tobacco. These products include cigarettes, chewing tobacco, and vaping devices, such as e-cigarettes. If you need help quitting, ask your health care provider. Take steps to protect your skin from the sun, such as: Avoiding the sun between 10:00 a.m. and 4:00 p.m. This is when the UV light is the strongest. Using a sunscreen or sunblock with SPF 30 or greater. Applying sunscreen before you are exposed to sunlight and reapplying as often as told by the instructions on the sunscreen container. Wearing protective gear, including: Sunglasses with UV protection. A hat and clothing that protect your skin from sunlight. Avoiding medicines that increase your sensitivity to sunlight when possible. Avoidingtanning beds and other indoor tanning devices. General instructions Take or apply over-the-counter and prescription medicines only as told by your health care provider. Return to your normal activities as told by your health care provider. Ask your health care provider what activities are safe for you. Have a skin exam done every year by a health care provider who is a skin specialist (dermatologist). Keep all follow-up visits. Your health care provider will want to check that the site has healed after treatment. Contact a health care provider if: You notice any changes or new growths on your skin. You have swelling, pain, or redness around your treated area. You have fluid or blood coming from your treated area. Your treated area feels warm to the touch. You have pus or a bad smell coming from your treated area. You have a fever or chills. You have a blister that becomes large and painful. Summary An actinic keratosis is a precancerous growth on the skin.If left untreated, these growths can develop into skin cancer. Check your skin regularly  for any growths, especially growths that start to itch or bleed, or change in size, shape, or color. Take steps to protect your skin from the sun. Contact a health care provider if you notice any changes or new growths on your skin. This information is not intended to replace advice given to you by your health care provider. Make sure you discuss any questions you have with your health care provider. Document Revised: 05/08/2021 Document Reviewed: 05/08/2021 Elsevier Patient Education  [employer].

## 2021-11-09 ENCOUNTER — Encounter: Payer: Self-pay | Admitting: Family Medicine

## 2021-11-10 IMAGING — DX DG PORTABLE PELVIS
1 series · 1 of 1 positions shown · non-contrast
Comparison: Preoperative radiograph 10/10/2019

CLINICAL DATA: Post left hip replacement.

EXAM:
PORTABLE PELVIS 1-2 VIEWS

[pelvis ap]
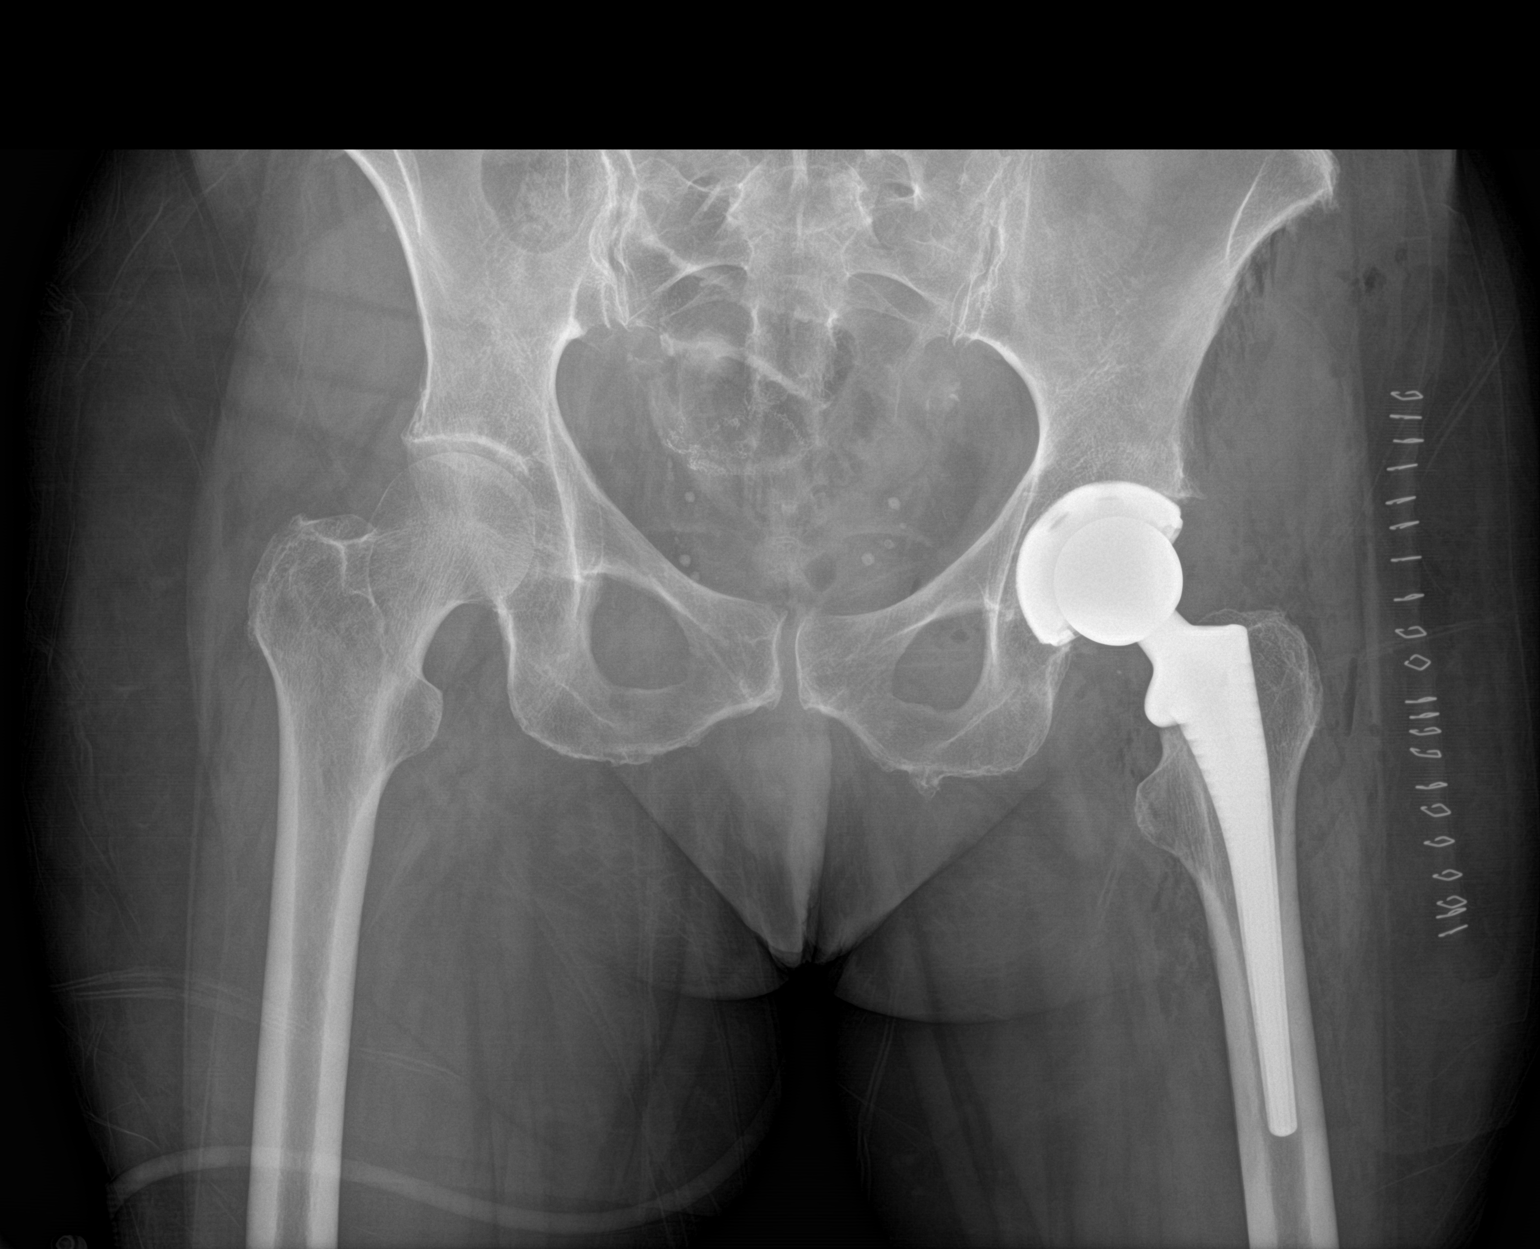

[1 of 1 positions shown; findings below may reference images not displayed]

FINDINGS: Left hip arthroplasty in expected alignment. No periprosthetic
lucency or fracture. Recent postsurgical change includes air and
edema in the soft tissues. There are lateral skin staples in place.
Remainder of the bony pelvis is intact.
IMPRESSION: Left hip arthroplasty without immediate postoperative complication.

## 2021-11-10 IMAGING — RF DG HIP (WITH PELVIS) OPERATIVE*L*
1 series · 5 of 5 positions shown · non-contrast
Comparison: 10/10/2019

CLINICAL DATA: Left hip arthroplasty

EXAM:
OPERATIVE LEFT HIP (WITH PELVIS IF PERFORMED) AP VIEWS
TECHNIQUE: Fluoroscopic spot image(s) were submitted for interpretation
post-operatively.

[Series 1: unknown protocol · 0.20mm/px · 5 of 5 slices shown]
[im 1/5]
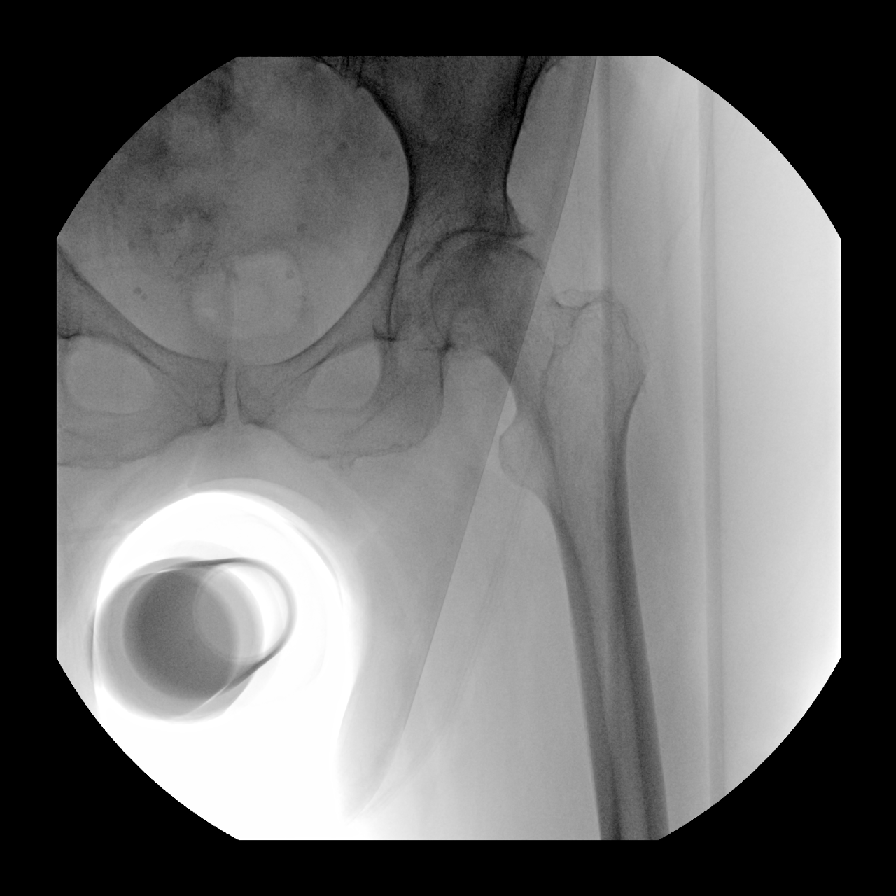
[im 2/5]
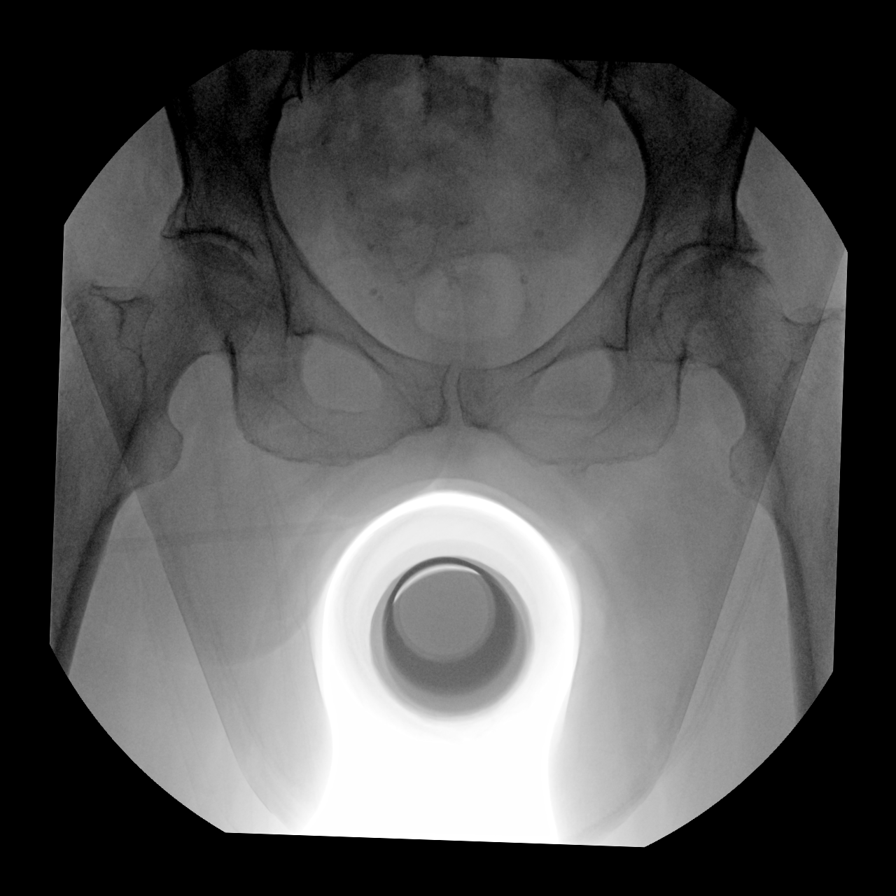
[im 3/5]
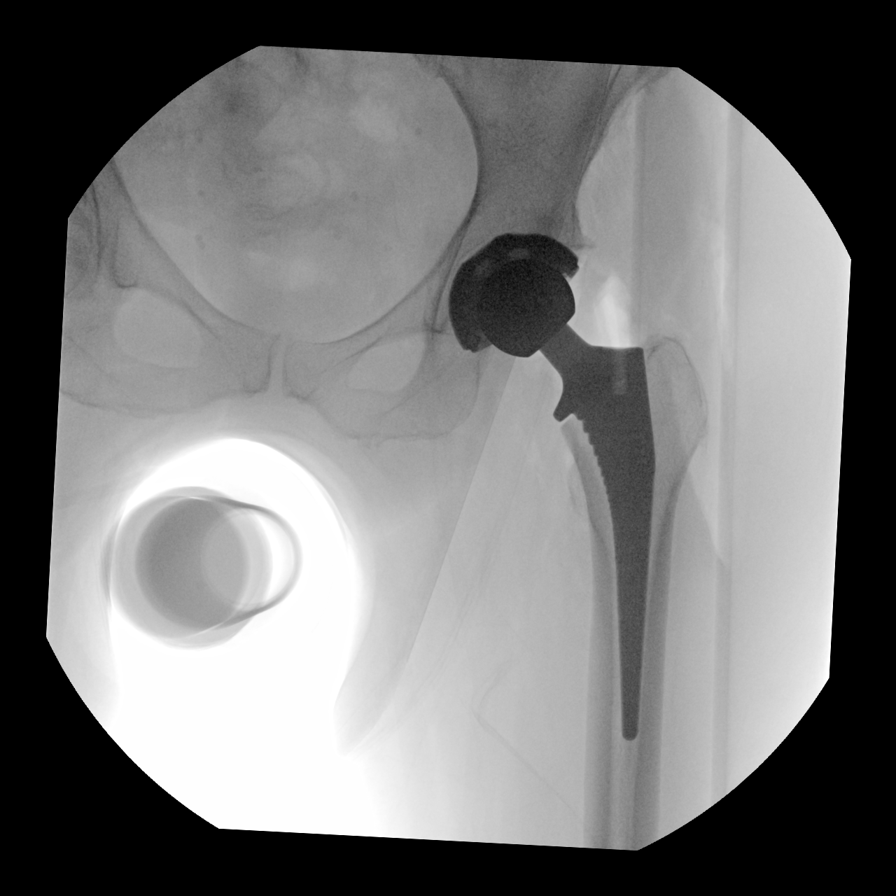
[im 4/5]
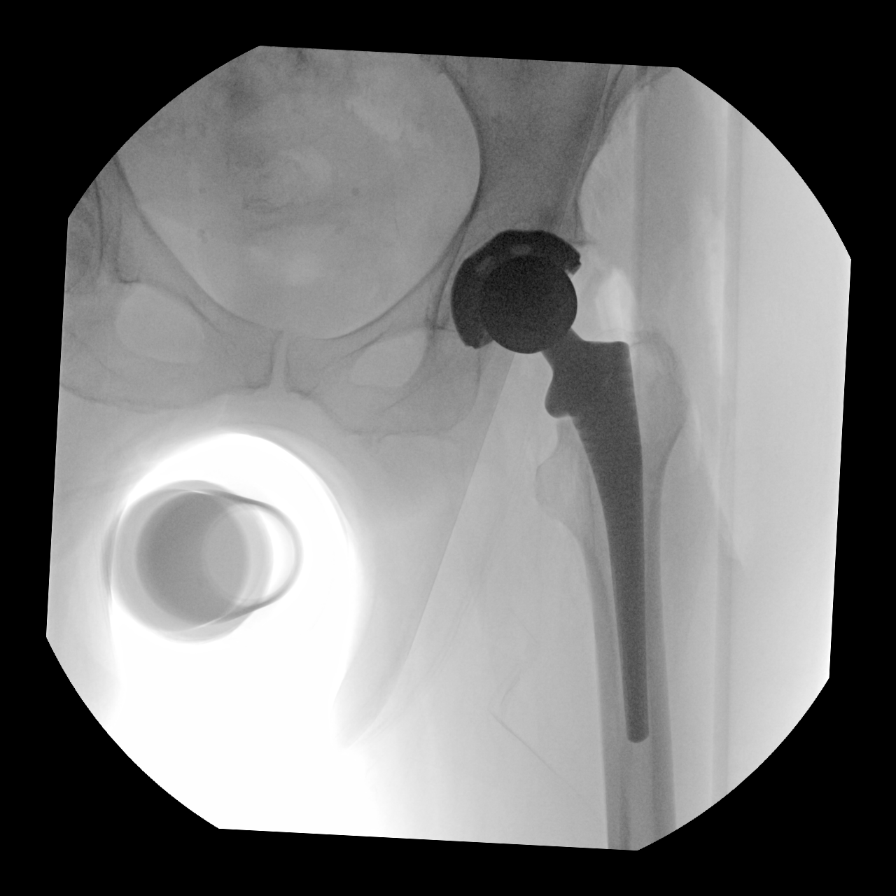
[im 5/5]
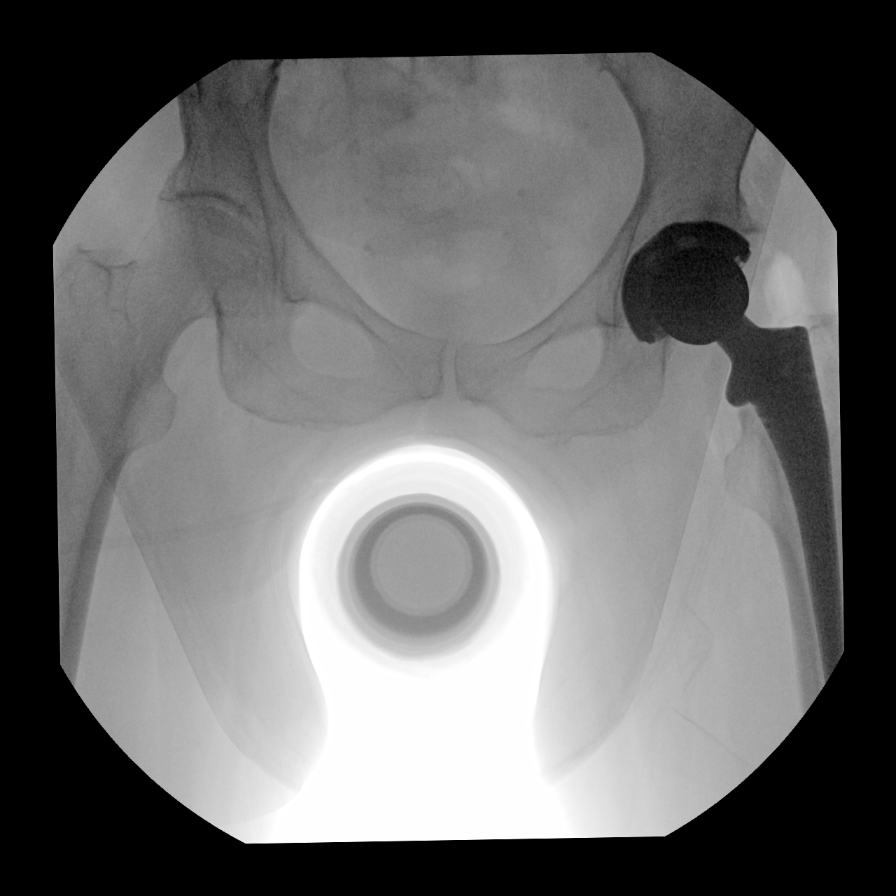

[5 of 5 positions shown; findings below may reference images not displayed]

FINDINGS: 5 C-arm fluoroscopic images were obtained intraoperatively and
submitted for post operative interpretation. Interval placement of
left total hip arthroplasty hardware. Arthroplasty components appear
in their expected alignment without evidence of intraoperative
complication. 22 seconds of fluoroscopy time was utilized. Please
see the performing provider's procedural report for further detail.
IMPRESSION: As above.

## 2021-11-10 IMAGING — RF DG C-ARM 1-60 MIN-NO REPORT
1 series · 5 of 5 positions shown · non-contrast
Comparison: 10/10/2019

CLINICAL DATA: Left hip arthroplasty

EXAM:
OPERATIVE LEFT HIP (WITH PELVIS IF PERFORMED) AP VIEWS
TECHNIQUE: Fluoroscopic spot image(s) were submitted for interpretation
post-operatively.

[Series 1: unknown protocol · 0.20mm/px · 5 of 5 slices shown]
[im 1/5]
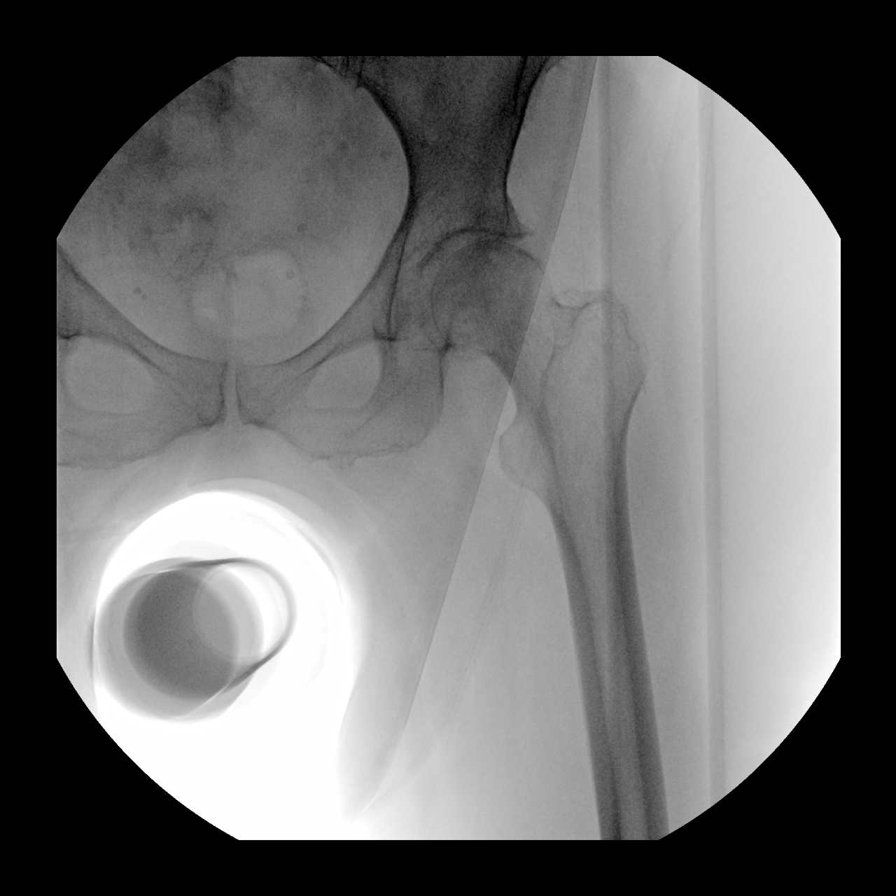
[im 2/5]
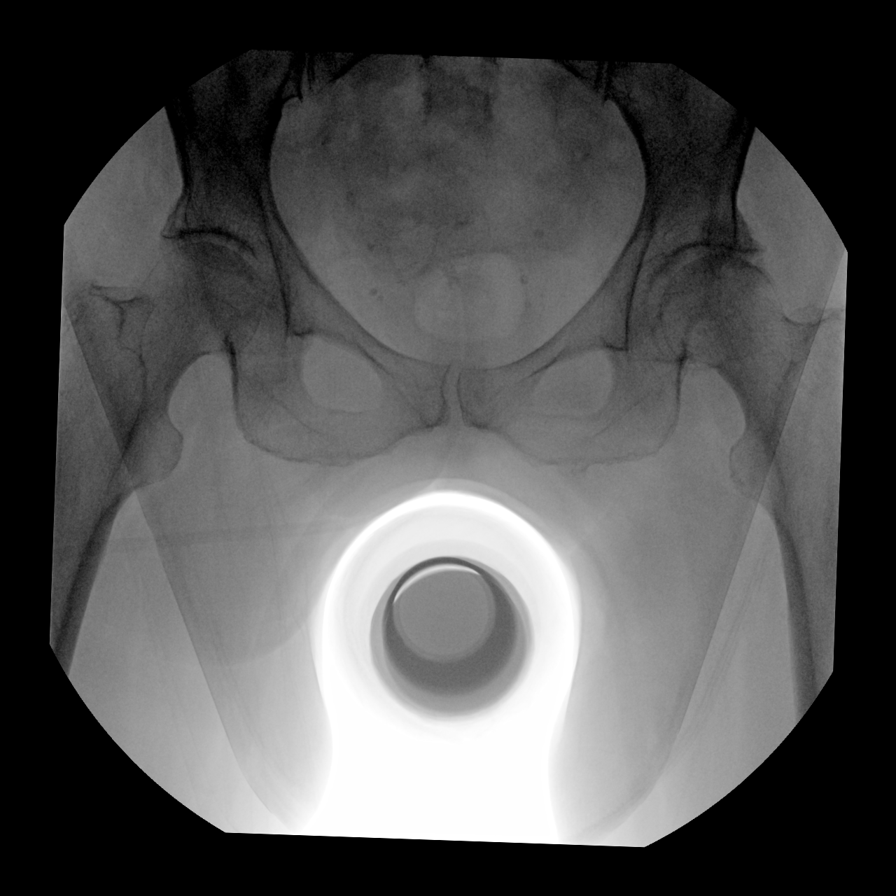
[im 3/5]
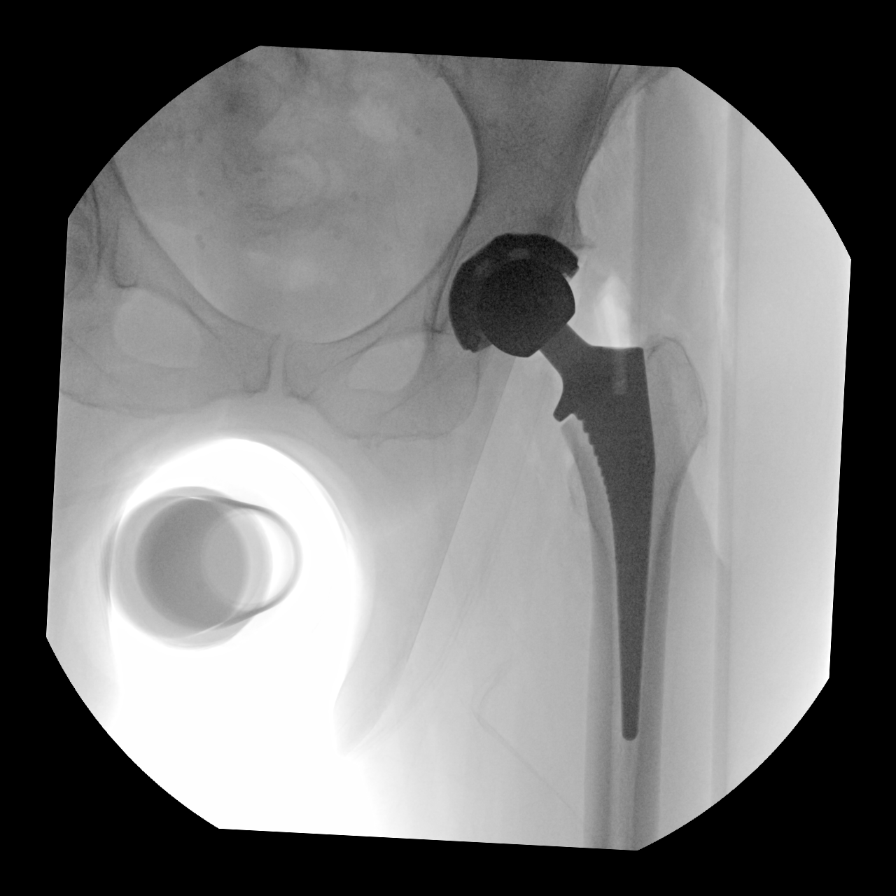
[im 4/5]
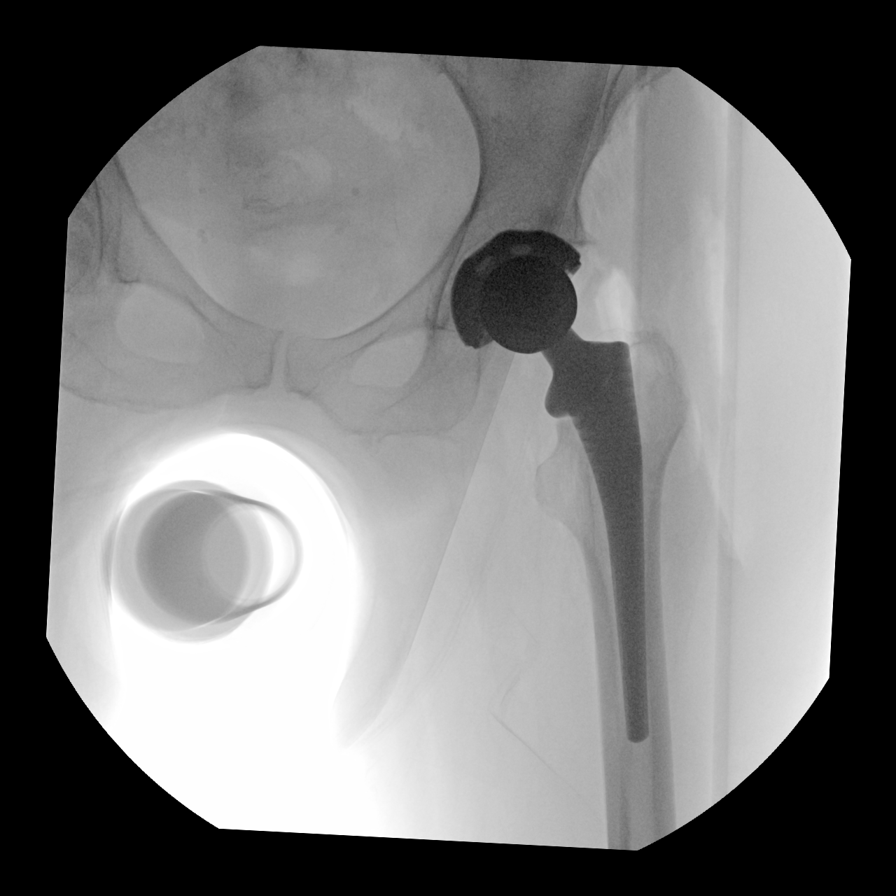
[im 5/5]
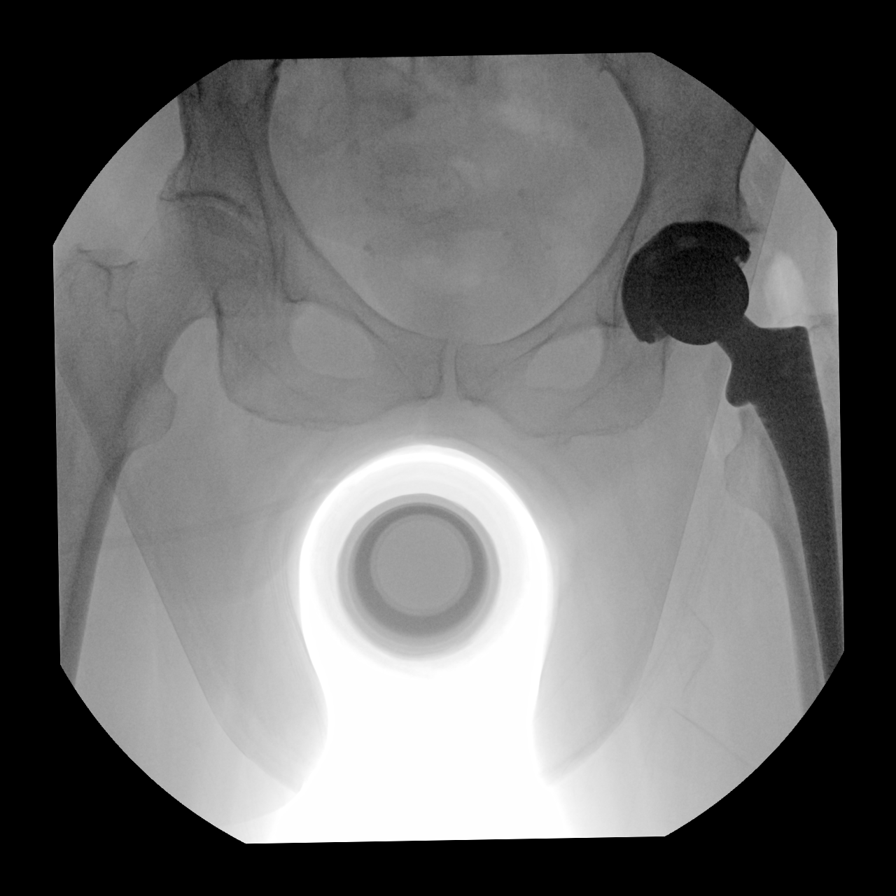

[5 of 5 positions shown; findings below may reference images not displayed]

FINDINGS: 5 C-arm fluoroscopic images were obtained intraoperatively and
submitted for post operative interpretation. Interval placement of
left total hip arthroplasty hardware. Arthroplasty components appear
in their expected alignment without evidence of intraoperative
complication. 22 seconds of fluoroscopy time was utilized. Please
see the performing provider's procedural report for further detail.
IMPRESSION: As above.

## 2021-11-24 ENCOUNTER — Inpatient Hospital Stay: Payer: Medicare PPO | Attending: Oncology

## 2021-11-24 DIAGNOSIS — C7A012 Malignant carcinoid tumor of the ileum: Secondary | ICD-10-CM | POA: Diagnosis not present

## 2021-11-24 DIAGNOSIS — I712 Thoracic aortic aneurysm, without rupture, unspecified: Secondary | ICD-10-CM | POA: Diagnosis not present

## 2021-11-24 DIAGNOSIS — C7A019 Malignant carcinoid tumor of the small intestine, unspecified portion: Secondary | ICD-10-CM

## 2021-11-24 DIAGNOSIS — Q231 Congenital insufficiency of aortic valve: Secondary | ICD-10-CM | POA: Insufficient documentation

## 2021-11-26 LAB — CHROMOGRANIN A: Chromogranin A (ng/mL): 69.7 ng/mL (ref 0.0–101.8)

## 2021-11-27 ENCOUNTER — Inpatient Hospital Stay: Payer: Medicare PPO | Admitting: Oncology

## 2021-11-27 VITALS — BP 141/70 | HR 100 | Temp 98.1°F | Resp 18 | Ht 62.0 in | Wt 127.0 lb

## 2021-11-27 DIAGNOSIS — I712 Thoracic aortic aneurysm, without rupture, unspecified: Secondary | ICD-10-CM | POA: Diagnosis not present

## 2021-11-27 DIAGNOSIS — C7A019 Malignant carcinoid tumor of the small intestine, unspecified portion: Secondary | ICD-10-CM

## 2021-11-27 DIAGNOSIS — C7A012 Malignant carcinoid tumor of the ileum: Secondary | ICD-10-CM | POA: Diagnosis not present

## 2021-11-27 DIAGNOSIS — Q231 Congenital insufficiency of aortic valve: Secondary | ICD-10-CM | POA: Diagnosis not present

## 2021-11-27 NOTE — Progress Notes (Signed)
  [place] OFFICE PROGRESS NOTE   Diagnosis: Carcinoid tumor  INTERVAL HISTORY:   Debra [name] returns as scheduled.  She feels well.  She reports approximately 3 episodes of diarrhea over the past several months.  No consistent diarrhea.  No flushing.  Good appetite and energy level.  No abdominal pain.  She has a mild cough and scratchy throat for the past few days.  No fever or dyspnea.  Objective:  Vital signs in last 24 hours:  Blood pressure (!) 141/70, pulse 100, temperature 98.1 F (36.7 C), temperature source Oral, resp. rate 18, height '5\' 2"'$  (1.575 m), weight 127 lb (57.6 kg), SpO2 100 %.    Lymphatics: No cervical, supraclavicular, axillary, or inguinal nodes Resp: Mild bilateral inspiratory/expiratory wheeze and bronchial sounds at the anterior and posterior chest, no respiratory distress Cardio: Regular rate and rhythm GI: No hepatosplenomegaly, nontender, no mass Vascular: No leg edema   Lab Results:  Lab Results  Component Value Date   WBC 6.2 11/29/2019   HGB 12.3 11/29/2019   HCT 38.2 11/29/2019   MCV 88.4 11/29/2019   PLT 199 11/29/2019   NEUTROABS 4.5 05/13/2015    CMP  Lab Results  Component Value Date   NA 143 10/16/2020   K 5.0 10/16/2020   CL 108 10/16/2020   CO2 26 10/16/2020   GLUCOSE 91 10/16/2020   BUN 15 10/16/2020   CREATININE 0.62 10/16/2020   CALCIUM 9.1 10/16/2020   PROT 5.9 (L) 10/16/2020   ALBUMIN 4.1 10/16/2020   AST 17 10/16/2020   ALT 10 10/16/2020   ALKPHOS 87 10/16/2020   BILITOT 1.8 (H) 10/16/2020   GFRNONAA >60 11/29/2019   GFRAA >60 11/29/2019     Medications: I have reviewed the patient's current medications.   Assessment/Plan:  Carcinoid tumors involving the the ileum, status post a small bowel resection 05/14/2014 with 1.4 cm and 1.1 cm tumors noted,T4Nx The larger tumor showed focal involvement of the serosal surface, resection margins negative, lymphovascular invasion present Persistent  abdominal pain and nausea, CT showed evidence of a distal small bowel obstruction 10/03/2014 with an octreotide scan 10/17/2014 confirming positive uptake in a mesenteric lymph node Small bowel resection 11/15/2014 confirmed multiple small bowel neuroendocrine tumors, grade 2, with metastatic tumor involving 4 lymph nodes   History of intermittent muscle pain and nausea/vomiting secondary to bowel obstruction from #1   3.   History of Microcytic anemia-secondary to iron deficiency, likely secondary to bleeding from the small bowel neuroendocrine tumors    4.   Thoracic Aortic aneurysm   5.   Bicuspid aortic valve    Disposition: Debra [name] remains in clinical remission from the carcinoid tumor.  The chromogranin a level is normal.  She will return for an office visit and chromogranin a level in 1 year.  She will seek medical attention if the respiratory symptoms do not improve.  She continues follow-up with cardiology for management of the bicuspid aortic valve and thoracic aneurysm.  Betsy Coder, MD  11/27/2021  10:20 AM

## 2022-01-05 NOTE — Progress Notes (Unsigned)
ACUTE VISIT Chief Complaint  Patient presents with   Shoulder Pain    Right shoulder pain x 2 months.   Wrist Pain    Hard boney growth on left wrist   HPI: [name] is a pleasant 71 y.o. female with medical hx significant for primary carcinoid tumor of small intestine, CAD,HLD,and ascending aortic aneurysm here today complaining of 2 months of right shoulder pain.  Shoulder Pain  The pain is present in the right shoulder. This is a new problem. The current episode started more than 1 month ago. There has been no history of extremity trauma. The problem occurs intermittently. The problem has been gradually improving. The pain is at a severity of 6/10. The pain is moderate. Associated symptoms include stiffness. Pertinent negatives include no fever, itching, joint locking, limited range of motion, numbness or tingling. The symptoms are aggravated by activity. She has tried nothing for the symptoms. Her past medical history is significant for osteoarthritis. There is no history of diabetes or gout.   The pain has improved slightly in the past few days. She reports that the pain was initially more widespread, but now it is localized to the posterior aspect of the shoulder. She rates the maximum pain level as a 6 out of 10, with occasional spikes to 7 or 8. She denies any unusual activities or repetitive movements that may have caused the shoulder pain.  Additionally, she reports a small, non-painful mass on the dorsum of the right wrist, noticed a couple of weeks ago. Because her history of  primary carcinoid tumor of small intestine Dx'ed about 8 years ago, she is more conscious of getting things checked. She denies any pain, changes in size, or skin changes related to the mass. She denies any chest pain, difficulty breathing, or palpitations.  Review of Systems  Constitutional:  Negative for fever.  HENT:  Negative for sore throat and trouble swallowing.   Respiratory:   Negative for cough and wheezing.   Gastrointestinal:  Negative for abdominal pain and nausea.  Musculoskeletal:  Positive for stiffness. Negative for gout.  Skin:  Negative for itching and rash.  Neurological:  Negative for tingling, weakness and numbness.  Rest see pertinent positives and negatives per HPI.  Current Outpatient Medications on File Prior to Visit  Medication Sig Dispense Refill   aspirin EC 81 MG tablet Take 81 mg by mouth daily. Swallow whole.     losartan (COZAAR) 25 MG tablet Take 1 tablet (25 mg total) by mouth daily. 90 tablet 3   metoprolol succinate (TOPROL-XL) 50 MG 24 hr tablet Take 1 tablet (50 mg total) by mouth daily. 90 tablet 3   simvastatin (ZOCOR) 20 MG tablet TAKE 1 TABLET BY MOUTH DAILY AT 6 PM. 90 tablet 3   No current facility-administered medications on file prior to visit.   Past Medical History:  Diagnosis Date   Aortic aneurysm ([place])    Arthritis    "moderate in back" (11/15/2014)   Bicuspid aortic valve    mild AS by echo 12/2016   Carcinoid tumor of small intestine, malignant ([place]) dx'd 05/2014   Colon cancer ([place])    NEUROENDOCRINE    Coronary artery disease    nonobstructive   H/O echocardiogram    bicuspid aortic valve with moderate aortic valve sclerosis,mild PR.TR.MR and mildly dilated aorta by echo on 8.2012   Heart murmur    History of kidney stones    Hypertension    Iron deficiency anemia    "  can't tolerate the supplements right now" (11/15/2014)   Mild mitral regurgitation    PONV (postoperative nausea and vomiting)    PREOP YEARS AGO WITH HYSTERECTOMY FELT " CREEPY" PRIOR TO SURGERY    Thoracic aortic aneurysm ([facility])    mild, 4.1 cm by recent CT followed by Debra [name]   Allergies  Allergen Reactions   Codeine Anaphylaxis and Shortness Of Breath   Opium Anaphylaxis   Oxycodone Shortness Of Breath   Penicillins Anaphylaxis and Shortness Of Breath   Azithromycin Diarrhea    Pt request not to take    Social History    Socioeconomic History   Marital status: Married    Spouse name: Not on file   Number of children: 2   Years of education: 17   Highest education level: Master's degree (e.g., MA, MS, MEng, MEd, MSW, MBA)  Occupational History   Not on file  Tobacco Use   Smoking status: Never   Smokeless tobacco: Never  Vaping Use   Vaping Use: Never used  Substance and Sexual Activity   Alcohol use: Yes    Alcohol/week: 4.0 standard drinks of alcohol    Types: 4 Glasses of wine per week    Comment: 5 GLASSES OF WINE PER WEEK    Drug use: No   Sexual activity: Yes  Other Topics Concern   Not on file  Social History Narrative   Married, spouse Debra [name]   Social Determinants of Health   Financial Resource Strain: Unknown (11/04/2021)   Overall Financial Resource Strain (CARDIA)    Difficulty of Paying Living Expenses: Patient refused  Food Insecurity: Unknown (11/04/2021)   Hunger Vital Sign    Worried About Running Out of Food in the Last Year: Patient refused    [place] in the Last Year: Patient refused  Transportation Needs: No Transportation Needs (11/04/2021)   PRAPARE - Hydrologist (Medical): No    Lack of Transportation (Non-Medical): No  Physical Activity: Unknown (11/04/2021)   Exercise Vital Sign    Days of Exercise per Week: Patient refused    Minutes of Exercise per Session: Not on file  Stress: Unknown (11/04/2021)   [place]    Feeling of Stress : Patient refused  Social Connections: Unknown (11/04/2021)   Social Connection and Isolation Panel [NHANES]    Frequency of Communication with Friends and Family: More than three times a week    Frequency of Social Gatherings with Friends and Family: More than three times a week    Attends Religious Services: Patient refused    Active Member of Clubs or Organizations: Yes    Attends Music therapist: More than  4 times per year    Marital Status: Married   Vitals:   01/06/22 1042  BP: 120/70  Pulse: 70  Resp: 12  Temp: 97.7 F (36.5 C)  SpO2: 99%   Body mass index is 23.48 kg/m.  Physical Exam Vitals and nursing note reviewed.  Constitutional:      General: She is not in acute distress.    Appearance: She is well-developed. She is not ill-appearing.  HENT:     Head: Normocephalic and atraumatic.     Mouth/Throat:     Mouth: Mucous membranes are moist.  Eyes:     Conjunctiva/sclera: Conjunctivae normal.  Cardiovascular:     Rate and Rhythm: Normal rate and regular rhythm.     Heart  sounds: Murmur (soft SEM LUSB) heard.  Pulmonary:     Effort: Pulmonary effort is normal. No respiratory distress.  Musculoskeletal:     Right shoulder: No effusion, tenderness or bony tenderness. Normal range of motion (No significant limitations of ROM.).       Hands:     Comments: Shoulder: No deformity, edema, or erythema appreciated.No muscle atrophy. Luan Pulling' test neg, empty can supraspinatus test neg, cross body adduction test neg, lift-Off Subscapularis test elicits mild pain. Hands: Heberden's node (DIP) present, no signs of synovitis.   Skin:    General: Skin is warm.     Findings: No erythema or rash.  Neurological:     Mental Status: She is alert and oriented to person, place, and time.   ASSESSMENT AND PLAN:  [name] was seen today for shoulder pain and wrist pain.  Diagnoses and all orders for this visit: Orders Placed This Encounter  Procedures   Ambulatory referral to Physical Therapy   Right shoulder pain, unspecified chronicity ? Subscapularis tendinitis, OA. Pain has mildly improved. I do not think imaging is needed at this time. If needed she can use Voltaren gel qid prn. Caution with activities that could aggravate pain. PT will help.  Dorsal wrist ganglion Dx , prognosis,treatment options,and differential Dx's discussed. It is small and she is not having  pain, so I think we can continue monitoring for now. If pain starts of she notice growth, we can consider ortho evaluation.  I spent a total of 31 minutes in both face to face and non face to face activities for this visit on the date of this encounter. During this time history was obtained and documented, examination was performed,and assessment/plan discussed.  Return if symptoms worsen or fail to improve.   Huntleigh Doolen G. [place], MD  [facility]. [place] office.

## 2022-01-06 ENCOUNTER — Encounter: Payer: Self-pay | Admitting: Family Medicine

## 2022-01-06 ENCOUNTER — Ambulatory Visit: Payer: Medicare PPO | Admitting: Family Medicine

## 2022-01-06 VITALS — BP 120/70 | HR 70 | Temp 97.7°F | Resp 12 | Ht 62.0 in | Wt 128.4 lb

## 2022-01-06 DIAGNOSIS — M67439 Ganglion, unspecified wrist: Secondary | ICD-10-CM

## 2022-01-06 DIAGNOSIS — M25511 Pain in right shoulder: Secondary | ICD-10-CM

## 2022-01-06 NOTE — Patient Instructions (Addendum)
A few things to remember from today's visit:  Right shoulder pain, unspecified chronicity - Plan: Ambulatory referral to Physical Therapy  Dorsal wrist ganglion  Range of motion exercises while waiting for PT.  Do not use My Chart to request refills or for acute issues that need immediate attention. If you send a my chart message, it may take a few days to be addressed, specially if I am not in the office.  Please be sure medication list is accurate. If a new problem present, please set up appointment sooner than planned today.

## 2022-03-17 ENCOUNTER — Encounter: Payer: Medicare PPO | Admitting: Family Medicine

## 2022-03-24 DIAGNOSIS — H5213 Myopia, bilateral: Secondary | ICD-10-CM | POA: Diagnosis not present

## 2022-03-24 DIAGNOSIS — H2511 Age-related nuclear cataract, right eye: Secondary | ICD-10-CM | POA: Diagnosis not present

## 2022-04-06 NOTE — Progress Notes (Unsigned)
HPI: Debra [name] is a 72 y.o. female, who is here today for her routine physical.  Last CPE: 10/16/20  Regular exercise 3 or more time per week: *** Following a healthy diet: ***  Chronic medical problems: ***  Immunization History  Administered Date(s) Administered  . Fluad Quad(high Dose 65+) 11/03/2018  . Influenza Split 12/16/2013  . Influenza, High Dose Seasonal PF 11/29/2016, 12/26/2017, 11/21/2019  . Influenza,inj,Quad PF,6+ Mos 12/24/2014, 12/02/2015  . Influenza-Unspecified 11/29/2016, 11/26/2020, 11/26/2021  . PFIZER Comirnaty(Gray Top)Covid-19 Tri-Sucrose Vaccine 06/17/2020  . PFIZER(Purple Top)SARS-COV-2 Vaccination 03/28/2019, 04/18/2019, 12/03/2019, 11/26/2021  . Pension scheme manager 3yr & up 12/16/2020  . Pneumococcal Conjugate-13 04/07/2016  . Pneumococcal Polysaccharide-23 03/10/2017  . Tdap 06/04/2017   Health Maintenance  Topic Date Due  . Zoster Vaccines- Shingrix (1 of 2) Never done  . Medicare Annual Wellness (AWV)  06/12/2021  . COVID-19 Vaccine (7 - 2023-24 season) 01/21/2022  . MAMMOGRAM  09/24/2022  . COLONOSCOPY (Pts 45-482yrInsurance coverage will need to be confirmed)  08/19/2024  . DTaP/Tdap/Td (2 - Td or Tdap) 06/05/2027  . Pneumonia Vaccine 6596Years old  Completed  . INFLUENZA VACCINE  Completed  . DEXA SCAN  Completed  . Hepatitis C Screening  Completed  . HPV VACCINES  Aged Out    She has *** concerns today.  Review of Systems  Current Outpatient Medications on File Prior to Visit  Medication Sig Dispense Refill  . aspirin EC 81 MG tablet Take 81 mg by mouth daily. Swallow whole.    . losartan (COZAAR) 25 MG tablet Take 1 tablet (25 mg total) by mouth daily. 90 tablet 3  . metoprolol succinate (TOPROL-XL) 50 MG 24 hr tablet Take 1 tablet (50 mg total) by mouth daily. 90 tablet 3  . simvastatin (ZOCOR) 20 MG tablet TAKE 1 TABLET BY MOUTH DAILY AT 6 PM. 90 tablet 3   No current  facility-administered medications on file prior to visit.    Past Medical History:  Diagnosis Date  . [place]ortic [place] ([facility]  . Arthritis    "moderate in back" (11/15/2014)  . Bicuspid aortic valve    mild AS by echo 12/2016  . Carcinoid tumor of small intestine, malignant ([facility] 05/2014  . [place] ([facility]   NEURO[facility]OCRINE   . Coronary artery disease    nonobstructive  . H/O echocardiogram    bicuspid aortic valve with moderate aortic valve sclerosis,mild PR.TR.MR and mildly dilated aorta by echo on 8.2012  . Heart murmur   . History of kidney stones   . Hypertension   . Iron deficiency anemia    "can't tolerate the supplements right now" (11/15/2014)  . Mild mitral regurgitation   . PONV (postoperative nausea and vomiting)    PREOP YEARS AGO WITH HYSTERECTOMY FELT " CREEPY" PRIOR TO SURGERY   . Thoracic aortic aneurysm (HCC)    mild, 4.1 cm by recent CT followed by Dr VaDarcey Nora  Past Surgical History:  Procedure Laterality Date  . ABDOMINAL HYSTERECTOMY    . BOWEL RESECTION N/A 11/15/2014   Procedure: SMALL BOWEL RESECTION;  Surgeon: BuGeorganna SkeansMD;  [facility] Service: General;  Laterality: N/A;  . CATARACT EXTRACTION W/ INTRAOCULAR LENS IMPLANT    . COLON SURGERY    . COLONOSCOPY  10/15/11   diverticulosis present, mild severity, no polyps, repeat in 10 years.  . Marland KitchenAPAROSCOPIC LYSIS OF ADHESIONS N/A 05/14/2014   Procedure: LAPAROSCOPIC LYSIS OF ADHESIONS  x 29mns;  Surgeon: BGeorganna Skeans MD;  [facility]  Service: General;  Laterality: N/A;  . LAPAROSCOPIC SMALL BOWEL RESECTION N/A 05/14/2014   Procedure: LAPAROSCOPIC ASSISTED SMALL BOWEL RESECTION;  Surgeon: BGeorganna Skeans MD;  [place] [facility]  Service: General;  Laterality: N/A;  . SMALL INTESTINE SURGERY  11/15/2014  . TONSILLECTOMY    . TOTAL HIP ARTHROPLASTY Left 12/08/2019   Procedure: LEFT TOTAL HIP ARTHROPLASTY ANTERIOR APPROACH;  Surgeon: BMcarthur Rossetti MD;  Location: [facility];  Service:  Orthopedics;  Laterality: Left;    Allergies  Allergen Reactions  . Codeine Anaphylaxis and Shortness Of Breath  . Opium Anaphylaxis  . Oxycodone Shortness Of Breath  . Penicillins Anaphylaxis and Shortness Of Breath  . Azithromycin Diarrhea    Pt request not to take     Family History  Problem Relation Age of Onset  . CAD Maternal Grandfather   . CAD Paternal Grandmother   . Heart attack Paternal Grandmother   . CAD Paternal Grandfather   . Breast cancer Mother   . Cancer Father   . Diabetes Father   . Stroke Neg Hx   . Hypertension Neg Hx     Social History   Socioeconomic History  . Marital status: Married    Spouse name: Not on file  . Number of children: 2  . Years of education: 122 . Highest education level: Master's degree (e.g., MA, MS, MEng, MEd, MSW, MBA)  Occupational History  . Not on file  Tobacco Use  . Smoking status: Never  . Smokeless tobacco: Never  Vaping Use  . Vaping Use: Never used  Substance and Sexual Activity  . Alcohol use: Yes    Alcohol/week: 4.0 standard drinks of alcohol    Types: 4 Glasses of wine per week    Comment: 5 GLASSES OF WINE PER WEEK   . Drug use: No  . Sexual activity: Yes  Other Topics Concern  . Not on file  Social History Narrative   Married, spouse JZendaya [place]  Social Determinants of Health   Financial Resource Strain: Unknown (11/04/2021)   Overall Financial Resource Strain (CARDIA)   . Difficulty of Paying Living Expenses: Patient refused  Food Insecurity: Unknown (11/04/2021)   Hunger Vital Sign   . Worried About RCharity fundraiserin the Last Year: Patient refused   . Ran Out of Food in the Last Year: Patient refused  Transportation Needs: No Transportation Needs (11/04/2021)   PRAPARE - Transportation   . Lack of Transportation (Medical): No   . Lack of Transportation (Non-Medical): No  Physical Activity: Unknown (11/04/2021)   Exercise Vital Sign   . Days of Exercise per Week: Patient refused   .  Minutes of Exercise per Session: Not on file  Stress: Unknown (11/04/2021)   [place]  . Feeling of Stress : Patient refused  Social Connections: Unknown (11/04/2021)   Social Connection and Isolation Panel [NHANES]   . Frequency of Communication with Friends and Family: More than three times a week   . Frequency of Social Gatherings with Friends and Family: More than three times a week   . Attends Religious Services: Patient refused   . Active Member of Clubs or Organizations: Yes   . Attends CArchivistMeetings: More than 4 times per year   . Marital Status: Married    There were no vitals filed for this visit. There is no  height or weight on file to calculate BMI.  Wt Readings from Last 3 Encounters:  01/06/22 128 lb 6 oz (58.2 kg)  11/27/21 127 lb (57.6 kg)  11/05/21 127 lb 2 oz (57.7 kg)    Physical Exam Vitals and nursing note reviewed.  Constitutional:      General: She is not in acute distress.    Appearance: She is well-developed.  HENT:     Head: Normocephalic and atraumatic.     Right Ear: Hearing, tympanic membrane, ear canal and external ear normal.     Left Ear: Hearing, tympanic membrane, ear canal and external ear normal.     Mouth/Throat:     Mouth: Mucous membranes are moist.     Pharynx: Oropharynx is clear. Uvula midline.  Eyes:     Extraocular Movements: Extraocular movements intact.     Conjunctiva/sclera: Conjunctivae normal.     Pupils: Pupils are equal, round, and reactive to light.  Neck:     Thyroid: No thyromegaly.     Trachea: No tracheal deviation.  Cardiovascular:     Rate and Rhythm: Normal rate and regular rhythm.     Pulses:          Dorsalis pedis pulses are 2+ on the right side and 2+ on the left side.       Posterior tibial pulses are 2+ on the right side and 2+ on the left side.     Heart sounds: No murmur heard. Pulmonary:     Effort: Pulmonary effort is  normal. No respiratory distress.     Breath sounds: Normal breath sounds.  Abdominal:     Palpations: Abdomen is soft. There is no hepatomegaly or mass.     Tenderness: There is no abdominal tenderness.  Genitourinary:    Comments: Deferred to gyn. Musculoskeletal:     Comments: No major deformity or signs of synovitis appreciated.  Lymphadenopathy:     Cervical: No cervical adenopathy.     Upper Body:     Right upper body: No supraclavicular adenopathy.     Left upper body: No supraclavicular adenopathy.  Skin:    General: Skin is warm.     Findings: No erythema or rash.  Neurological:     General: No focal deficit present.     Mental Status: She is alert and oriented to person, place, and time.     Cranial Nerves: No cranial nerve deficit.     Coordination: Coordination normal.     Gait: Gait normal.     Deep Tendon Reflexes:     Reflex Scores:      Bicep reflexes are 2+ on the right side and 2+ on the left side.      Patellar reflexes are 2+ on the right side and 2+ on the left side. Psychiatric:     Comments: Well groomed, good eye contact.   ASSESSMENT AND PLAN: Debra [name] was here today annual physical examination.  No orders of the defined types were placed in this encounter.   There are no diagnoses linked to this encounter.  There are no diagnoses linked to this encounter.  No follow-ups on file.  Nasreen Goedecke G. Martinique, MD  [facility]. [place] office.

## 2022-04-07 ENCOUNTER — Ambulatory Visit (INDEPENDENT_AMBULATORY_CARE_PROVIDER_SITE_OTHER): Payer: Medicare PPO | Admitting: Family Medicine

## 2022-04-07 ENCOUNTER — Encounter: Payer: Self-pay | Admitting: Family Medicine

## 2022-04-07 VITALS — BP 110/70 | HR 76 | Temp 97.6°F | Resp 16 | Ht 62.0 in | Wt 127.4 lb

## 2022-04-07 DIAGNOSIS — C7A019 Malignant carcinoid tumor of the small intestine, unspecified portion: Secondary | ICD-10-CM

## 2022-04-07 DIAGNOSIS — M79604 Pain in right leg: Secondary | ICD-10-CM | POA: Diagnosis not present

## 2022-04-07 DIAGNOSIS — Z0000 Encounter for general adult medical examination without abnormal findings: Secondary | ICD-10-CM | POA: Diagnosis not present

## 2022-04-07 DIAGNOSIS — E7800 Pure hypercholesterolemia, unspecified: Secondary | ICD-10-CM

## 2022-04-07 DIAGNOSIS — R7303 Prediabetes: Secondary | ICD-10-CM | POA: Diagnosis not present

## 2022-04-07 DIAGNOSIS — M79605 Pain in left leg: Secondary | ICD-10-CM | POA: Diagnosis not present

## 2022-04-07 DIAGNOSIS — I7121 Aneurysm of the ascending aorta, without rupture: Secondary | ICD-10-CM | POA: Diagnosis not present

## 2022-04-07 DIAGNOSIS — I10 Essential (primary) hypertension: Secondary | ICD-10-CM

## 2022-04-07 LAB — TSH: TSH: 3.33 u[IU]/mL (ref 0.35–5.50)

## 2022-04-07 LAB — LIPID PANEL
Cholesterol: 155 mg/dL (ref 0–200)
HDL: 72.6 mg/dL (ref >39.00)
LDL Cholesterol: 68 mg/dL (ref 0–99)
NonHDL: 82.38
Total CHOL/HDL Ratio: 2
Triglycerides: 70 mg/dL (ref 0.0–149.0)
VLDL: 14 mg/dL (ref 0.0–40.0)

## 2022-04-07 LAB — COMPREHENSIVE METABOLIC PANEL
ALT: 11 U/L (ref 0–35)
AST: 17 U/L (ref 0–37)
Albumin: 4.2 g/dL (ref 3.5–5.2)
Alkaline Phosphatase: 98 U/L (ref 39–117)
BUN: 16 mg/dL (ref 6–23)
CO2: 27 mEq/L (ref 19–32)
Calcium: 9.1 mg/dL (ref 8.4–10.5)
Chloride: 106 mEq/L (ref 96–112)
Creatinine, Ser: 0.6 mg/dL (ref 0.40–1.20)
GFR: 89.16 mL/min (ref >60.00)
Glucose, Bld: 90 mg/dL (ref 70–99)
Potassium: 4.3 mEq/L (ref 3.5–5.1)
Sodium: 141 mEq/L (ref 135–145)
Total Bilirubin: 1.7 mg/dL — ABNORMAL HIGH (ref 0.2–1.2)
Total Protein: 6.3 g/dL (ref 6.0–8.3)

## 2022-04-07 LAB — HEMOGLOBIN A1C: Hgb A1c MFr Bld: 6 % (ref 4.6–6.5)

## 2022-04-07 NOTE — Assessment & Plan Note (Signed)
Last echo in 12/2019: aortic dilatation noted. There is moderate dilatation of the ascending aorta, measuring 41 mm . She was seen Dr Darcey Nora. Continue Metoprolol Succinate and adequate BP/HR controlled. Follows with cardiologist.

## 2022-04-07 NOTE — Assessment & Plan Note (Signed)
BP adequately controlled. Continue Losartan 25 mg daily and Metoprolol succinate 50 mg daily as well as low salt diet.

## 2022-04-07 NOTE — Assessment & Plan Note (Signed)
Involvement of ileum, s/p small bowel resection in 05/2024. Follows with oncologist annually.

## 2022-04-07 NOTE — Patient Instructions (Addendum)
A few things to remember from today's visit:  Routine physical examination  Prediabetes - Plan: Hemoglobin A1c  Pure hypercholesterolemia - Plan: Comprehensive metabolic panel, Lipid panel  Essential hypertension, benign - Plan: Comprehensive metabolic panel, TSH  If you need refills for medications you take chronically, please call your pharmacy. Do not use My Chart to request refills or for acute issues that need immediate attention. If you send a my chart message, it may take a few days to be addressed, specially if I am not in the office.  Please be sure medication list is accurate. If a new problem present, please set up appointment sooner than planned today.

## 2022-04-07 NOTE — Assessment & Plan Note (Signed)
Continue Simvastatin 20 mg daily and low fat diet.

## 2022-04-07 NOTE — Assessment & Plan Note (Signed)
We discussed the importance of regular physical activity and healthy diet for prevention of chronic illness and/or complications. Preventive guidelines reviewed. Vaccination up to date. Ca++ and vit D supplementation recommended, she can start a daily multivitamin. Next CPE in a year.

## 2022-04-07 NOTE — Assessment & Plan Note (Signed)
HgA1C 6.0 in 10/2020. Encouraged consistency with following an healthy life style for diabetes prevention.

## 2022-05-06 NOTE — Progress Notes (Unsigned)
ACUTE VISIT Chief Complaint  Patient presents with   Leg Pain    Back of legs x 4 weeks. Both legs, achy feeling. No sharp pains.   HPI: [name] is a 72 y.o. female with PMHx significant for primary carcinoid tumor of small intestine, HTN,CAD,HLD,and ascending aortic aneurysm  here today complaining of of leg pain that has been ongoing for more than four weeks. Pain localized in posterior aspect of both thighs.  Leg Pain  The incident occurred more than 1 week ago. The pain is at a severity of 4/10. The pain is mild. The pain has been Constant since onset. Pertinent negatives include no inability to bear weight, loss of motion, loss of sensation, muscle weakness, numbness or tingling. Nothing aggravates the symptoms. She has tried nothing for the symptoms.  She reports that the pain is aching in nature, not sharp or shooting.  The pain does not bother her at night and does not wake her up.  She experiences the pain primarily in the mornings when she gets up and sometimes in the late afternoons. The patient rates her pain as a 3 or 4 on a scale of 10. Negative for edema or erythema.  She has a history of back pain for decades, stable. She occasionally experiences sharp pain in the middle of her lower back. She denies any numbness or tingling in her legs. She takes ibuprofen or acetaminophen for her back pain as needed, which is less than once a month.  Hx of hip OA, she had a left hip replacement two years ago, pain resolved.  Lumbar MRI done in 10/2019 for LBP radiated to LLE. 1. Grade 1 spondylolisthesis at L4-L5 has progressed since 2006 with multifactorial moderate to severe spinal, lateral recess, and foraminal stenosis. Query left L4 and/or L5 radiculitis.  2. Comparatively mild lumbar spine degeneration elsewhere.Borderline to mild spinal and foraminal stenosis at L3-L4. She has seen orthopedics .   Review of Systems  Constitutional:  Negative for activity  change, appetite change and fever.  Cardiovascular:  Negative for chest pain, palpitations and leg swelling.  Gastrointestinal:  Negative for abdominal pain, nausea and vomiting.  Genitourinary:  Negative for decreased urine volume, dysuria and hematuria.  Musculoskeletal:  Negative for gait problem.  Skin:  Negative for rash.  Neurological:  Negative for tingling and numbness.  See other pertinent positives and negatives in HPI.  Current Outpatient Medications on File Prior to Visit  Medication Sig Dispense Refill   aspirin EC 81 MG tablet Take 81 mg by mouth daily. Swallow whole.     losartan (COZAAR) 25 MG tablet Take 1 tablet (25 mg total) by mouth daily. 90 tablet 3   metoprolol succinate (TOPROL-XL) 50 MG 24 hr tablet Take 1 tablet (50 mg total) by mouth daily. 90 tablet 3   simvastatin (ZOCOR) 20 MG tablet TAKE 1 TABLET BY MOUTH DAILY AT 6 PM. 90 tablet 3   No current facility-administered medications on file prior to visit.   Past Medical History:  Diagnosis Date   Aortic aneurysm ([place])    Arthritis    "moderate in back" (11/15/2014)   Bicuspid aortic valve    mild AS by echo 12/2016   Carcinoid tumor of small intestine, malignant ([place]) dx'd 05/2014   Colon cancer ([place])    NEUROENDOCRINE    Coronary artery disease    nonobstructive   H/O echocardiogram    bicuspid aortic valve with moderate aortic valve sclerosis,mild PR.TR.MR and mildly dilated  aorta by echo on 8.2012   Heart murmur    History of kidney stones    Hypertension    Iron deficiency anemia    "can't tolerate the supplements right now" (11/15/2014)   Mild mitral regurgitation    PONV (postoperative nausea and vomiting)    PREOP YEARS AGO WITH HYSTERECTOMY FELT " CREEPY" PRIOR TO SURGERY    Thoracic aortic aneurysm (HCC)    mild, 4.1 cm by recent CT followed by Dr Darcey Nora   Allergies  Allergen Reactions   Codeine Anaphylaxis and Shortness Of Breath   Opium Anaphylaxis   Oxycodone Shortness Of Breath    Penicillins Anaphylaxis and Shortness Of Breath   Azithromycin Diarrhea    Pt request not to take     Social History   Socioeconomic History   Marital status: Married    Spouse name: Not on file   Number of children: 2   Years of education: 17   Highest education level: Master's degree (e.g., MA, MS, MEng, MEd, MSW, MBA)  Occupational History   Not on file  Tobacco Use   Smoking status: Never   Smokeless tobacco: Never  Vaping Use   Vaping Use: Never used  Substance and Sexual Activity   Alcohol use: Yes    Alcohol/week: 4.0 standard drinks of alcohol    Types: 4 Glasses of wine per week    Comment: 5 GLASSES OF WINE PER WEEK    Drug use: No   Sexual activity: Yes  Other Topics Concern   Not on file  Social History Narrative   Married, spouse Debra [name]   Social Determinants of Health   Financial Resource Strain: Unknown (11/04/2021)   Overall Financial Resource Strain (CARDIA)    Difficulty of Paying Living Expenses: Patient refused  Food Insecurity: Unknown (11/04/2021)   Hunger Vital Sign    Worried About Running Out of Food in the Last Year: Patient refused    [place] in the Last Year: Patient refused  Transportation Needs: No Transportation Needs (11/04/2021)   PRAPARE - Hydrologist (Medical): No    Lack of Transportation (Non-Medical): No  Physical Activity: Unknown (11/04/2021)   Exercise Vital Sign    Days of Exercise per Week: Patient refused    Minutes of Exercise per Session: Not on file  Stress: Unknown (11/04/2021)   [place]    Feeling of Stress : Patient refused  Social Connections: Unknown (11/04/2021)   Social Connection and Isolation Panel [NHANES]    Frequency of Communication with Friends and Family: More than three times a week    Frequency of Social Gatherings with Friends and Family: More than three times a week    Attends Religious  Services: Patient refused    Active Member of Clubs or Organizations: Yes    Attends Archivist Meetings: More than 4 times per year    Marital Status: Married   Vitals:   05/08/22 0843  BP: 128/80  Pulse: 85  Resp: 16  Temp: 97.7 F (36.5 C)  SpO2: 99%   Body mass index is 23.5 kg/m.  Physical Exam Vitals and nursing note reviewed.  Constitutional:      General: She is not in acute distress.    Appearance: She is well-developed. She is not ill-appearing.  HENT:     Head: Normocephalic and atraumatic.  Eyes:     Conjunctiva/sclera: Conjunctivae normal.  Cardiovascular:  Rate and Rhythm: Normal rate and regular rhythm.     Heart sounds: Murmur (SEM I/VI LUSB) heard.     Comments: DP and PT pulses palpable bilateral. Pulmonary:     Effort: Pulmonary effort is normal. No respiratory distress.     Breath sounds: Normal breath sounds.  Abdominal:     Palpations: Abdomen is soft. There is no hepatomegaly or mass.     Tenderness: There is no abdominal tenderness.  Musculoskeletal:     Lumbar back: No tenderness or bony tenderness. Negative right straight leg raise test and negative left straight leg raise test.     Right hip: No tenderness. Normal range of motion.     Left hip: No tenderness. Decreased range of motion (Slight limited flexion.).  Lymphadenopathy:     Cervical: No cervical adenopathy.     Upper Body:     Right upper body: No supraclavicular adenopathy.     Left upper body: No supraclavicular adenopathy.  Skin:    General: Skin is warm.     Findings: No erythema or rash.  Neurological:     General: No focal deficit present.     Mental Status: She is alert and oriented to person, place, and time.     Gait: Gait normal.     Deep Tendon Reflexes:     Reflex Scores:      Patellar reflexes are 2+ on the right side and 2+ on the left side. Psychiatric:     Comments: Well groomed, good eye contact.   ASSESSMENT AND PLAN:  Ms. Basciano was seen  today for bilateral leg pain.  Pain in both lower extremities Assessment & Plan: We discussed possible etiologies. History and examination today do not suggest a serious process. Peripheral pulses are palpable, no symptoms suggestive of claudication. ?  Radicular pain versus hamstring tightness. She agrees with trying PT. Monitor for new symptoms. Instructed about warning signs.  Orders: -     Ambulatory referral to Physical Therapy  Chronic midline low back pain, unspecified whether sciatica present Assessment & Plan: Problem has been otherwise stable. Last lumbar MRI done in 10/2020. Tylenol 500 mg 3-4 times per day if needed.   Return if symptoms worsen or fail to improve, for keep next appointment.  Leticia Mcdiarmid G. Martinique, MD  [facility]. [place] office.

## 2022-05-08 ENCOUNTER — Encounter: Payer: Self-pay | Admitting: Family Medicine

## 2022-05-08 ENCOUNTER — Ambulatory Visit: Payer: Medicare PPO | Admitting: Family Medicine

## 2022-05-08 VITALS — BP 128/80 | HR 85 | Temp 97.7°F | Resp 16 | Ht 62.0 in | Wt 128.5 lb

## 2022-05-08 DIAGNOSIS — M545 Low back pain, unspecified: Secondary | ICD-10-CM | POA: Diagnosis not present

## 2022-05-08 DIAGNOSIS — G8929 Other chronic pain: Secondary | ICD-10-CM

## 2022-05-08 DIAGNOSIS — M79605 Pain in left leg: Secondary | ICD-10-CM | POA: Diagnosis not present

## 2022-05-08 DIAGNOSIS — M79604 Pain in right leg: Secondary | ICD-10-CM | POA: Diagnosis not present

## 2022-05-08 NOTE — Assessment & Plan Note (Signed)
Problem has been otherwise stable. Last lumbar MRI done in 10/2020. Tylenol 500 mg 3-4 times per day if needed.

## 2022-05-08 NOTE — Assessment & Plan Note (Signed)
We discussed possible etiologies. History and examination today do not suggest a serious process. Peripheral pulses are palpable, no symptoms suggestive of claudication. ?  Radicular pain versus hamstring tightness. She agrees with trying PT. Monitor for new symptoms. Instructed about warning signs.

## 2022-05-08 NOTE — Patient Instructions (Signed)
A few things to remember from today's visit:  Pain in both lower extremities - Plan: Ambulatory referral to Physical Therapy  Chronic midline low back pain, unspecified whether sciatica present I do not think it is vascular. ? Muscle tightness or nerve pain. Stretching exercises may help. Topical icy hot at night on affected area. Monitor for new symptoms.  If you need refills for medications you take chronically, please call your pharmacy. Do not use My Chart to request refills or for acute issues that need immediate attention. If you send a my chart message, it may take a few days to be addressed, specially if I am not in the office.  Please be sure medication list is accurate. If a new problem present, please set up appointment sooner than planned today.

## 2022-06-15 ENCOUNTER — Telehealth: Payer: Self-pay | Admitting: Family Medicine

## 2022-06-15 NOTE — Telephone Encounter (Signed)
Called patient to schedule Medicare Annual Wellness Visit (AWV). Left message for patient to call back and schedule Medicare Annual Wellness Visit (AWV).  Last date of AWV: 06/12/20  Please schedule an appointment at any time with [facility] or [employer].  If any questions, please contact me at [phone].  Thank you ,  Rudell Cobb AWV direct phone # [phone]

## 2022-06-30 ENCOUNTER — Ambulatory Visit (INDEPENDENT_AMBULATORY_CARE_PROVIDER_SITE_OTHER): Payer: Medicare PPO

## 2022-06-30 DIAGNOSIS — Q231 Congenital insufficiency of aortic valve: Secondary | ICD-10-CM | POA: Diagnosis not present

## 2022-06-30 LAB — ECHOCARDIOGRAM COMPLETE
AR max vel: 0.63 cm2
AV Area VTI: 0.66 cm2
AV Area mean vel: 0.59 cm2
AV Mean grad: 14 mmHg
AV Peak grad: 24.6 mmHg
Ao pk vel: 2.48 m/s
Area-P 1/2: 2.87 cm2
MV M vel: 4.52 m/s
MV Peak grad: 81.7 mmHg
Radius: 0.3 cm
S' Lateral: 2.96 cm

## 2022-07-07 NOTE — Progress Notes (Signed)
Cardiology Office Note:    Date:  07/08/2022   ID:  Debra [name], [place] 27-Feb-1951, MRN [id number]  PCP:  [place], Betty G, MD  Cardiologist:  Jodelle Red, MD  Referring MD: [place], Betty G, MD   CC: Follow-up, discuss echo results  History of Present Illness:    Debra [name] is a 72 y.o. female with a hx of TAA, bicuspid aortic valve with mild-moderate stenosis, nonobstructive CAD, hypertension, dyslipidemia who is seen for follow up today. She was a prior patient of Dr. Mayford Knife, seen for preop clearance by Georgie Chard 11/2019.  Cardiac history: TAA: 41 cm in 2017, has seen Dr. Maren Beach Nonobstructive CAD: cath 2009 as below. Widely patent cors except for 40-50% ostial LM Hypertension Bicuspid aortic valve: echo 2021, mild-moderate stenosis (mean gradient 14 mmHg, DI 0.24, AVA 0.77) Hypercholesterolemia  Follows with Dr. Truett Perna for neuroendocrine cancer history.  Can climb flight of stairs without limitation. Vegetarian for 38 years. Drinks 1 glass of wine 5 nights/week and a cocktail one night/week.  Repeat echo 06/30/2022 revealed LVEF 55-60%, normal diastolic parameters, mild to moderate mitral valve regurgitation. Bicuspid aortic valve. Mild calcification and thickening of the aortic valve. Mild aortic valve stenosis.  Today, she states that she is feeling great. She denies any palpitations or other cardiovascular concerns. Her most intense activity recently was walking up a 15-story structure in [place] a few weeks ago. She completed this without issue. We reviewed the results of her echo in detail.  Generally she has noticed that she is somewhat more fatigued by activity. This has been gradual over years and she attributes it to her age.  Her children are 53 and 74 yo. We discussed that it is recommended they complete screening to assess their aortic valve.  She is compliant with aspirin; no bruising or bleeding issues.  She denies any  palpitations, chest pain, shortness of breath, or peripheral edema. No lightheadedness, headaches, syncope, orthopnea, or PND.   Past Medical History:  Diagnosis Date   Aortic aneurysm (HCC)    Arthritis    "moderate in back" (11/15/2014)   Bicuspid aortic valve    mild AS by echo 12/2016   Carcinoid tumor of small intestine, malignant (HCC) dx'd 05/2014   Colon cancer (HCC)    NEUROENDOCRINE    Coronary artery disease    nonobstructive   H/O echocardiogram    bicuspid aortic valve with moderate aortic valve sclerosis,mild PR.TR.MR and mildly dilated aorta by echo on 8.2012   Heart murmur    History of kidney stones    Hypertension    Iron deficiency anemia    "can't tolerate the supplements right now" (11/15/2014)   Mild mitral regurgitation    PONV (postoperative nausea and vomiting)    PREOP YEARS AGO WITH HYSTERECTOMY FELT " CREEPY" PRIOR TO SURGERY    Thoracic aortic aneurysm (HCC)    mild, 4.1 cm by recent CT followed by Dr Maren Beach    Past Surgical History:  Procedure Laterality Date   ABDOMINAL HYSTERECTOMY     BOWEL RESECTION N/A 11/15/2014   Procedure: SMALL BOWEL RESECTION;  Surgeon: Violeta Gelinas, MD;  Location: [facility] OR;  Service: General;  Laterality: N/A;   CATARACT EXTRACTION W/ INTRAOCULAR LENS IMPLANT     COLON SURGERY     COLONOSCOPY  10/15/11   diverticulosis present, mild severity, no polyps, repeat in 10 years.   LAPAROSCOPIC LYSIS OF ADHESIONS N/A 05/14/2014   Procedure: LAPAROSCOPIC LYSIS OF ADHESIONS x ;  Surgeon:  Violeta Gelinas, MD;  Location: [facility] OR;  Service: General;  Laterality: N/A;   LAPAROSCOPIC SMALL BOWEL RESECTION N/A 05/14/2014   Procedure: LAPAROSCOPIC ASSISTED SMALL BOWEL RESECTION;  Surgeon: Violeta Gelinas, MD;  Location: [facility] OR;  Service: General;  Laterality: N/A;   SMALL INTESTINE SURGERY  11/15/2014   TONSILLECTOMY     TOTAL HIP ARTHROPLASTY Left 12/08/2019   Procedure: LEFT TOTAL HIP ARTHROPLASTY ANTERIOR APPROACH;  Surgeon: Kathryne Hitch, MD;  Location: [facility] ORS;  Service: Orthopedics;  Laterality: Left;    Current Medications: Current Outpatient Medications on File Prior to Visit  Medication Sig   aspirin EC 81 MG tablet Take 81 mg by mouth daily. Swallow whole.   losartan (COZAAR) 25 MG tablet Take 1 tablet (25 mg total) by mouth daily.   simvastatin (ZOCOR) 20 MG tablet TAKE 1 TABLET BY MOUTH DAILY AT 6 PM.   No current facility-administered medications on file prior to visit.     Allergies:   Codeine, Opium, Oxycodone, Penicillins, and Azithromycin   Social History   Tobacco Use   Smoking status: Never   Smokeless tobacco: Never  Vaping Use   Vaping Use: Never used  Substance Use Topics   Alcohol use: Yes    Alcohol/week: 4.0 standard drinks of alcohol    Types: 4 Glasses of wine per week    Comment: 5 GLASSES OF WINE PER WEEK    Drug use: No    Family History: family history includes Breast cancer in her mother; CAD in her maternal grandfather, paternal grandfather, and paternal grandmother; Cancer in her father; Diabetes in her father; Heart attack in her paternal grandmother. There is no history of Stroke or Hypertension.  ROS:   Please see the history of present illness.   Additional pertinent ROS otherwise unremarkable.  EKGs/Labs/Other Studies Reviewed:    The following studies were reviewed today:  Echo  06/30/2022:  1. Left ventricular ejection fraction, by estimation, is 55 to 60%. Left  ventricular ejection fraction by 3D volume is 56 %. The left ventricle has  normal function. The left ventricle has no regional wall motion  abnormalities. Left ventricular diastolic   parameters were normal. The average left ventricular global longitudinal  strain is -16.8 %. The global longitudinal strain is normal.   2. Right ventricular systolic function is normal. The right ventricular  size is normal. There is normal pulmonary artery systolic pressure. The  estimated right ventricular  systolic pressure is 24.3 mmHg.   3. The mitral valve is normal in structure. Mild to moderate mitral valve  regurgitation. No evidence of mitral stenosis.   4. The aortic valve is bicuspid. There is mild calcification of the  aortic valve. There is mild thickening of the aortic valve. Aortic valve  regurgitation is not visualized. Mild aortic valve stenosis. Aortic valve  mean gradient measures 14.0 mmHg.  Aortic valve Vmax measures 2.48 m/s.   5. The inferior vena cava is normal in size with greater than 50%  respiratory variability, suggesting right atrial pressure of 3 mmHg.   Comparison(s): Prior images reviewed side by side. Prior 2021: AV mean  gradient 14 mmHg, unchanged.   Echo  01/02/2020:  1. Left ventricular ejection fraction, by estimation, is 55 to 60%. The  left ventricle has normal function. The left ventricle has no regional  wall motion abnormalities. Left ventricular diastolic parameters are  indeterminate.   2. Right ventricular systolic function is normal. The right ventricular  size is normal.  3. The mitral valve is grossly normal. Mild to moderate mitral valve  regurgitation. No evidence of mitral stenosis.   4. The aortic valve is bicuspid. Aortic valve regurgitation is trivial.  Mild aortic valve stenosis.   5. Aortic dilatation noted. There is moderate dilatation of the ascending  aorta, measuring 41 mm.   LV SV:         45 AV Area (Vmax):    0.79 cm  AV Area (Vmean):   0.77 cm  AV Area (VTI):     0.75 cm  AV Vmax:           238.40 cm/s  AV Vmean:          177.800 cm/s  AV VTI:            0.607 m  AV Peak Grad:      22.7 mmHg  AV Mean Grad:      14.2 mmHg  LVOT Vmax:         60.00 cm/s  LVOT Vmean:        43.450 cm/s  LVOT VTI:          0.145 m  LVOT/AV VTI ratio: 0.24   MR angiogram 03/29/2018 Maximal diameter of the ascending aorta at the sinus of Valsalva, sino-tubular junction, and ascending aorta are 3.0 cm, 2.7 cm and 4.1 cm. This  compares with 4.1 cm in the ascending aorta on the prior study. There is no evidence of aortic dissection or acute intramural hematoma. The great vessels are patent within the confines of the examination. The descending thoracic aorta is nonaneurysmal.  Cath 04/07/2007  RESULTS:  1. The left main coronary is widely patent and bifurcates into a left      anterior descending artery and left circumflex artery.  2. The left anterior descending artery has an ostial 40-50% narrowing      but then is widely patent throughout its course to the apex.  It      gives rise to two diagonal branches, both of which are widely      patent.  3. The left circumflex is widely patent throughout its course giving      rise to four obtuse marginal branches, which are rather large and      widely patent.  4. The right coronary artery is widely patent throughout its course      across the inferior wall.  It bifurcates into a posterior      descending artery and posterolateral artery, both of which are      widely patent    Left ventriculography showed normal LV function, EF 60%, left  ventricular pressure was 137/-12 mmHg.  Aortic pressure 157/57 mmHg.  LVEDP was 11 mmHg.    Aortography showed an aortic root aneurysm of approximately 4.5 cm.    ASSESSMENT:  1. Nonobstructive coronary disease.  2. Normal left ventricular function.  3. Hypertension.  4. Dilated aortic root.  EKG:  EKG is personally reviewed.   07/08/2022:  EKG was not ordered. 07/07/21: NSR at 60 bpm, RBBB  Recent Labs: 04/07/2022: ALT 11; BUN 16; Creatinine, Ser 0.60; Potassium 4.3; Sodium 141; TSH 3.33   Recent Lipid Panel    Component Value Date/Time   CHOL 155 04/07/2022 0950   CHOL 170 03/01/2019 1100   CHOL 141 02/19/2014 0740   TRIG 70.0 04/07/2022 0950   TRIG 92 02/19/2014 0740   HDL 72.60 04/07/2022 0950   HDL 78 03/01/2019 1100   HDL  69 02/19/2014 0740   CHOLHDL 2 04/07/2022 0950   VLDL 14.0 04/07/2022 0950   LDLCALC  68 04/07/2022 0950   LDLCALC 77 03/01/2019 1100   LDLCALC 54 02/19/2014 0740    Physical Exam:    VS:  BP 120/74   Pulse 69   Ht 5\' 2"  (1.575 m)   Wt 128 lb (58.1 kg)   BMI 23.41 kg/m     Wt Readings from Last 3 Encounters:  07/08/22 128 lb (58.1 kg)  05/08/22 128 lb 8 oz (58.3 kg)  04/07/22 127 lb 6 oz (57.8 kg)    GEN: Well nourished, well developed in no acute distress HEENT: Normal, moist mucous membranes NECK: No JVD CARDIAC: regular rhythm, normal S1 and S2, no rubs or gallops. 1/6 systolic murmur. VASCULAR: Radial and DP pulses 2+ bilaterally. No carotid bruits RESPIRATORY:  Clear to auscultation without rales, wheezing or rhonchi  ABDOMEN: Soft, non-tender, non-distended MUSCULOSKELETAL:  Ambulates independently SKIN: Warm and dry, no edema NEUROLOGIC:  Alert and oriented x 3. No focal neuro deficits noted. PSYCHIATRIC:  Normal affect    ASSESSMENT:    1. Bicuspid aortic valve   2. Aneurysm of ascending aorta without rupture (HCC)   3. Primary hypertension   4. Pure hypercholesterolemia   5. Atherosclerosis of native coronary artery of native heart without angina pectoris   6. Encounter to discuss test results     PLAN:    Nonobstructive CAD:  -cath 2009. Widely patent cors except for 40-50% ostial LM -continue aspirin, statin  Hypertension: -Continue losartan, metoprolol  Bicuspid aortic valve:  -stable on echo this year, mild stenosis -repeat echo 3 years or sooner with symptoms -asymptomatic -she will have her children screened  Thoracic aortic aneurysm -stable  Hypercholesterolemia: LDL 68, continue simvastatin  Cardiac risk counseling and prevention recommendations: -recommend heart healthy/Mediterranean diet, with whole grains, fruits, vegetable, fish, lean meats, nuts, and olive oil. Limit salt. -recommend moderate walking, 3-5 times/week for 30-50 minutes each session. Aim for at least 150 minutes.week. Goal should be pace of 3  miles/hours, or walking 1.5 miles in 30 minutes -recommend avoidance of tobacco products. Avoid excess alcohol.  Plan for follow up: annual or sooner as needed.  Jodelle Red, MD, PhD, [facility]  [facility]    Medication Adjustments/Labs and Tests Ordered: Current medicines are reviewed at length with the patient today.  Concerns regarding medicines are outlined above.   No orders of the defined types were placed in this encounter.  No orders of the defined types were placed in this encounter.  Patient Instructions  Medication Instructions:  We are going to taper off the metoprolol. Take 1/2 pill daily for 2 weeks, then take 1/2 pill every other day for two weeks, then stop. If you have palpitations, headaches, or blood pressure consistently more than 140/90 consistently, call me and we will make adjustments.   *If you need a refill on your cardiac medications before your next appointment, please call your pharmacy*   Lab Work: None   Testing/Procedures: None   Follow-Up: At [facility], you and your health needs are our priority.  As part of our continuing mission to provide you with exceptional heart care, we have created designated Provider Care Teams.  These Care Teams include your primary Cardiologist (physician) and Advanced Practice Providers (APPs -  Physician Assistants and Nurse Practitioners) who all work together to provide you with the care you need, when you need it.  We recommend signing  up for the patient portal called "MyChart".  Sign up information is provided on this After Visit Summary.  MyChart is used to connect with patients for Virtual Visits (Telemedicine).  Patients are able to view lab/test results, encounter notes, upcoming appointments, etc.  Non-urgent messages can be sent to your provider as well.   To learn more about what you can do with MyChart, go to [email or web address].    Your next appointment:   1  year(s)  Provider:   Jodelle Red, MD    Other Instructions None   I,Mathew Stumpf,acting as a scribe for Jodelle Red, MD.,have documented all relevant documentation on the behalf of Jodelle Red, MD,as directed by  Jodelle Red, MD while in the presence of Jodelle Red, MD.  I, Jodelle Red, MD, have reviewed all documentation for this visit. The documentation on 07/08/22 for the exam, diagnosis, procedures, and orders are all accurate and complete.   Signed, Jodelle Red, MD PhD 07/08/2022 3:13 PM    [facility]

## 2022-07-08 ENCOUNTER — Ambulatory Visit (HOSPITAL_BASED_OUTPATIENT_CLINIC_OR_DEPARTMENT_OTHER): Payer: Medicare PPO | Admitting: Cardiology

## 2022-07-08 ENCOUNTER — Encounter (HOSPITAL_BASED_OUTPATIENT_CLINIC_OR_DEPARTMENT_OTHER): Payer: Self-pay | Admitting: Cardiology

## 2022-07-08 VITALS — BP 120/74 | HR 69 | Ht 62.0 in | Wt 128.0 lb

## 2022-07-08 DIAGNOSIS — I2510 Atherosclerotic heart disease of native coronary artery without angina pectoris: Secondary | ICD-10-CM | POA: Diagnosis not present

## 2022-07-08 DIAGNOSIS — Q231 Congenital insufficiency of aortic valve: Secondary | ICD-10-CM

## 2022-07-08 DIAGNOSIS — I10 Essential (primary) hypertension: Secondary | ICD-10-CM

## 2022-07-08 DIAGNOSIS — E7800 Pure hypercholesterolemia, unspecified: Secondary | ICD-10-CM | POA: Diagnosis not present

## 2022-07-08 DIAGNOSIS — I7121 Aneurysm of the ascending aorta, without rupture: Secondary | ICD-10-CM | POA: Diagnosis not present

## 2022-07-08 DIAGNOSIS — Z712 Person consulting for explanation of examination or test findings: Secondary | ICD-10-CM | POA: Diagnosis not present

## 2022-07-08 NOTE — Patient Instructions (Addendum)
Medication Instructions:  We are going to taper off the metoprolol. Take 1/2 pill daily for 2 weeks, then take 1/2 pill every other day for two weeks, then stop. If you have palpitations, headaches, or blood pressure consistently more than 140/90 consistently, call me and we will make adjustments.   *If you need a refill on your cardiac medications before your next appointment, please call your pharmacy*   Lab Work: None   Testing/Procedures: None   Follow-Up: At [facility], you and your health needs are our priority.  As part of our continuing mission to provide you with exceptional heart care, we have created designated Provider Care Teams.  These Care Teams include your primary Cardiologist (physician) and Advanced Practice Providers (APPs -  Physician Assistants and Nurse Practitioners) who all work together to provide you with the care you need, when you need it.  We recommend signing up for the patient portal called "MyChart".  Sign up information is provided on this After Visit Summary.  MyChart is used to connect with patients for Virtual Visits (Telemedicine).  Patients are able to view lab/test results, encounter notes, upcoming appointments, etc.  Non-urgent messages can be sent to your provider as well.   To learn more about what you can do with MyChart, go to [email or web address].    Your next appointment:   1 year(s)  Provider:   Jodelle Red, MD    Other Instructions None

## 2022-08-04 ENCOUNTER — Other Ambulatory Visit (HOSPITAL_BASED_OUTPATIENT_CLINIC_OR_DEPARTMENT_OTHER): Payer: Self-pay

## 2022-08-04 MED ORDER — TETANUS-DIPHTH-ACELL PERTUSSIS 5-2.5-18.5 LF-MCG/0.5 IM SUSY
0.5000 mL | PREFILLED_SYRINGE | Freq: Once | INTRAMUSCULAR | 0 refills | Status: AC
  Filled 2022-08-04: qty 0.5, 1d supply, fill #0

## 2022-08-05 ENCOUNTER — Ambulatory Visit (INDEPENDENT_AMBULATORY_CARE_PROVIDER_SITE_OTHER): Payer: Medicare PPO

## 2022-08-05 VITALS — Ht 62.0 in | Wt 128.0 lb

## 2022-08-05 DIAGNOSIS — Z0000 Encounter for general adult medical examination without abnormal findings: Secondary | ICD-10-CM

## 2022-08-05 NOTE — Patient Instructions (Addendum)
Debra [name] , Thank you for taking time to come for your Medicare Wellness Visit. I appreciate your ongoing commitment to your health goals. Please review the following plan we discussed and let me know if I can assist you in the future.   These are the goals we discussed:  Goals       Eat less sugar (pt-stated)      Exercise 3x per week (30 min per time)        This is a list of the screening recommended for you and due dates:  Health Maintenance  Topic Date Due   COVID-19 Vaccine (7 - 2023-24 season) 08/21/2022*   Zoster (Shingles) Vaccine (1 of 2) 03/12/2023*   Mammogram  09/24/2022   Flu Shot  10/08/2022   Medicare Annual Wellness Visit  08/05/2023   Colon Cancer Screening  08/19/2024   DTaP/Tdap/Td vaccine (3 - Td or Tdap) 08/03/2032   Pneumonia Vaccine  Completed   DEXA scan (bone density measurement)  Completed   Hepatitis C Screening  Completed   HPV Vaccine  Aged Out  *Topic was postponed. The date shown is not the original due date.    Advanced directives: In chart  Conditions/risks identified: None  Next appointment: Follow up in one year for your annual wellness visit    Preventive Care 65 Years and Older, Female Preventive care refers to lifestyle choices and visits with your health care provider that can promote health and wellness. What does preventive care include? A yearly physical exam. This is also called an annual well check. Dental exams once or twice a year. Routine eye exams. Ask your health care provider how often you should have your eyes checked. Personal lifestyle choices, including: Daily care of your teeth and gums. Regular physical activity. Eating a healthy diet. Avoiding tobacco and drug use. Limiting alcohol use. Practicing safe sex. Taking low-dose aspirin every day. Taking vitamin and mineral supplements as recommended by your health care provider. What happens during an annual well check? The services and screenings done by your  health care provider during your annual well check will depend on your age, overall health, lifestyle risk factors, and family history of disease. Counseling  Your health care provider may ask you questions about your: Alcohol use. Tobacco use. Drug use. Emotional well-being. Home and relationship well-being. Sexual activity. Eating habits. History of falls. Memory and ability to understand (cognition). Work and work Astronomer. Reproductive health. Screening  You may have the following tests or measurements: Height, weight, and BMI. Blood pressure. Lipid and cholesterol levels. These may be checked every 5 years, or more frequently if you are over 74 years old. Skin check. Lung cancer screening. You may have this screening every year starting at age 74 if you have a 30-pack-year history of smoking and currently smoke or have quit within the past 15 years. Fecal occult blood test (FOBT) of the stool. You may have this test every year starting at age 74. Flexible sigmoidoscopy or colonoscopy. You may have a sigmoidoscopy every 5 years or a colonoscopy every 10 years starting at age 74. Hepatitis C blood test. Hepatitis B blood test. Sexually transmitted disease (STD) testing. Diabetes screening. This is done by checking your blood sugar (glucose) after you have not eaten for a while (fasting). You may have this done every 1-3 years. Bone density scan. This is done to screen for osteoporosis. You may have this done starting at age 74. Mammogram. This may be done every 1-2 years.  Talk to your health care provider about how often you should have regular mammograms. Talk with your health care provider about your test results, treatment options, and if necessary, the need for more tests. Vaccines  Your health care provider may recommend certain vaccines, such as: Influenza vaccine. This is recommended every year. Tetanus, diphtheria, and acellular pertussis (Tdap, Td) vaccine. You may  need a Td booster every 10 years. Zoster vaccine. You may need this after age 1. Pneumococcal 13-valent conjugate (PCV13) vaccine. One dose is recommended after age 74. Pneumococcal polysaccharide (PPSV23) vaccine. One dose is recommended after age 74. Talk to your health care provider about which screenings and vaccines you need and how often you need them. This information is not intended to replace advice given to you by your health care provider. Make sure you discuss any questions you have with your health care provider. Document Released: 03/22/2015 Document Revised: 11/13/2015 Document Reviewed: 12/25/2014 Elsevier Interactive Patient Education  2017 [employer].  Fall Prevention in the Home Falls can cause injuries. They can happen to people of all ages. There are many things you can do to make your home safe and to help prevent falls. What can I do on the outside of my home? Regularly fix the edges of walkways and driveways and fix any cracks. Remove anything that might make you trip as you walk through a door, such as a raised step or threshold. Trim any bushes or trees on the path to your home. Use bright outdoor lighting. Clear any walking paths of anything that might make someone trip, such as rocks or tools. Regularly check to see if handrails are loose or broken. Make sure that both sides of any steps have handrails. Any raised decks and porches should have guardrails on the edges. Have any leaves, snow, or ice cleared regularly. Use sand or salt on walking paths during winter. Clean up any spills in your garage right away. This includes oil or grease spills. What can I do in the bathroom? Use night lights. Install grab bars by the toilet and in the tub and shower. Do not use towel bars as grab bars. Use non-skid mats or decals in the tub or shower. If you need to sit down in the shower, use a plastic, non-slip stool. Keep the floor dry. Clean up any water that spills on  the floor as soon as it happens. Remove soap buildup in the tub or shower regularly. Attach bath mats securely with double-sided non-slip rug tape. Do not have throw rugs and other things on the floor that can make you trip. What can I do in the bedroom? Use night lights. Make sure that you have a light by your bed that is easy to reach. Do not use any sheets or blankets that are too big for your bed. They should not hang down onto the floor. Have a firm chair that has side arms. You can use this for support while you get dressed. Do not have throw rugs and other things on the floor that can make you trip. What can I do in the kitchen? Clean up any spills right away. Avoid walking on wet floors. Keep items that you use a lot in easy-to-reach places. If you need to reach something above you, use a strong step stool that has a grab bar. Keep electrical cords out of the way. Do not use floor polish or wax that makes floors slippery. If you must use wax, use non-skid floor wax.  Do not have throw rugs and other things on the floor that can make you trip. What can I do with my stairs? Do not leave any items on the stairs. Make sure that there are handrails on both sides of the stairs and use them. Fix handrails that are broken or loose. Make sure that handrails are as long as the stairways. Check any carpeting to make sure that it is firmly attached to the stairs. Fix any carpet that is loose or worn. Avoid having throw rugs at the top or bottom of the stairs. If you do have throw rugs, attach them to the floor with carpet tape. Make sure that you have a light switch at the top of the stairs and the bottom of the stairs. If you do not have them, ask someone to add them for you. What else can I do to help prevent falls? Wear shoes that: Do not have high heels. Have rubber bottoms. Are comfortable and fit you well. Are closed at the toe. Do not wear sandals. If you use a stepladder: Make sure  that it is fully opened. Do not climb a closed stepladder. Make sure that both sides of the stepladder are locked into place. Ask someone to hold it for you, if possible. Clearly mark and make sure that you can see: Any grab bars or handrails. First and last steps. Where the edge of each step is. Use tools that help you move around (mobility aids) if they are needed. These include: Canes. Walkers. Scooters. Crutches. Turn on the lights when you go into a dark area. Replace any light bulbs as soon as they burn out. Set up your furniture so you have a clear path. Avoid moving your furniture around. If any of your floors are uneven, fix them. If there are any pets around you, be aware of where they are. Review your medicines with your doctor. Some medicines can make you feel dizzy. This can increase your chance of falling. Ask your doctor what other things that you can do to help prevent falls. This information is not intended to replace advice given to you by your health care provider. Make sure you discuss any questions you have with your health care provider. Document Released: 12/20/2008 Document Revised: 08/01/2015 Document Reviewed: 03/30/2014 Elsevier Interactive Patient Education  2017 [employer].

## 2022-08-05 NOTE — Progress Notes (Signed)
Subjective:   Debra [name] is a 72 y.o. female who presents for Medicare Annual (Subsequent) preventive examination.  Review of Systems    Virtual Visit via Telephone Note  I connected with  Debra [name] on 08/05/22 at  8:45 AM EDT by telephone and verified that I am speaking with the correct person using two identifiers.  Location: Patient: Home Provider: Office Persons participating in the virtual visit: patient/Nurse Health Advisor   I discussed the limitations, risks, security and privacy concerns of performing an evaluation and management service by telephone and the availability of in person appointments. The patient expressed understanding and agreed to proceed.  Interactive audio and video telecommunications were attempted between this nurse and patient, however failed, due to patient having technical difficulties OR patient did not have access to video capability.  We continued and completed visit with audio only.  Some vital signs may be absent or patient reported.   Debra Rung, LPN  Cardiac Risk Factors include: advanced age (>77men, >46 women)     Objective:    Today's Vitals   08/05/22 0851  Weight: 128 lb (58.1 kg)  Height: 5\' 2"  (1.575 m)   Body mass index is 23.41 kg/m.     08/05/2022    8:58 AM 11/27/2021   10:06 AM 06/12/2020   11:05 AM 01/09/2020    3:03 PM 11/29/2019    9:09 AM 07/30/2016   11:41 AM 12/02/2015    4:06 PM  Advanced Directives  Does Patient Have a Medical Advance Directive? Yes No Yes Yes Yes Yes Yes  Type of Estate agent of [place];Living will   Healthcare Power of [place];Living will Healthcare Power of [place];Living will Healthcare Power of [place];Living will Healthcare Power of [place];Living will  Does patient want to make changes to medical advance directive? No - Patient declined  No - Patient declined No - Patient declined     Copy of Healthcare Power of Attorney in Chart?  Yes - validated most recent copy scanned in chart (See row information)   Yes - validated most recent copy scanned in chart (See row information)  Yes Yes  Would patient like information on creating a medical advance directive?  No - Patient declined         Current Medications (verified) Outpatient Encounter Medications as of 08/05/2022  Medication Sig   aspirin EC 81 MG tablet Take 81 mg by mouth daily. Swallow whole.   losartan (COZAAR) 25 MG tablet Take 1 tablet (25 mg total) by mouth daily.   simvastatin (ZOCOR) 20 MG tablet TAKE 1 TABLET BY MOUTH DAILY AT 6 PM.   Tdap (BOOSTRIX) 5-2.5-18.5 LF-MCG/0.5 injection Inject 0.5 mLs into the muscle once for 1 dose.   No facility-administered encounter medications on file as of 08/05/2022.    Allergies (verified) Codeine, Opium, Oxycodone, Penicillins, and Azithromycin   History: Past Medical History:  Diagnosis Date   Aortic aneurysm (HCC)    Arthritis    "moderate in back" (11/15/2014)   Bicuspid aortic valve    mild AS by echo 12/2016   Carcinoid tumor of small intestine, malignant (HCC) dx'd 05/2014   Colon cancer (HCC)    NEUROENDOCRINE    Coronary artery disease    nonobstructive   H/O echocardiogram    bicuspid aortic valve with moderate aortic valve sclerosis,mild PR.TR.MR and mildly dilated aorta by echo on 8.2012   Heart murmur    History of kidney stones    Hypertension  Iron deficiency anemia    "can't tolerate the supplements right now" (11/15/2014)   Mild mitral regurgitation    PONV (postoperative nausea and vomiting)    PREOP YEARS AGO WITH HYSTERECTOMY FELT " CREEPY" PRIOR TO SURGERY    Thoracic aortic aneurysm (HCC)    mild, 4.1 cm by recent CT followed by Dr Maren Beach   Past Surgical History:  Procedure Laterality Date   ABDOMINAL HYSTERECTOMY     BOWEL RESECTION N/A 11/15/2014   Procedure: SMALL BOWEL RESECTION;  Surgeon: Violeta Gelinas, MD;  Location: [facility];  Service: General;  Laterality: N/A;   CATARACT  EXTRACTION W/ INTRAOCULAR LENS IMPLANT     COLON SURGERY     COLONOSCOPY  10/15/11   diverticulosis present, mild severity, no polyps, repeat in 10 years.   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   Procedure: LEFT TOTAL HIP ARTHROPLASTY ANTERIOR APPROACH;  Surgeon: Kathryne Hitch, MD;  Location: [facility] ORS;  Service: Orthopedics;  Laterality: Left;   Family History  Problem Relation Age of Onset   CAD Maternal Grandfather    CAD Paternal Grandmother    Heart attack Paternal Grandmother    CAD Paternal Grandfather    Breast cancer Mother    Cancer Father    Diabetes Father    Stroke Neg Hx    Hypertension Neg Hx    Social History   Socioeconomic History   Marital status: Married    Spouse name: Not on file   Number of children: 2   Years of education: 17   Highest education level: Master's degree (e.g., MA, MS, MEng, MEd, MSW, MBA)  Occupational History   Not on file  Tobacco Use   Smoking status: Never   Smokeless tobacco: Never  Vaping Use   Vaping Use: Never used  Substance and Sexual Activity   Alcohol use: Yes    Alcohol/week: 4.0 standard drinks of alcohol    Types: 4 Glasses of wine per week    Comment: 5 GLASSES OF WINE PER WEEK    Drug use: No   Sexual activity: Yes  Other Topics Concern   Not on file  Social History Narrative   Married, spouse Debra [name]   Social Determinants of Health   Financial Resource Strain: Patient Declined (11/04/2021)   Overall Financial Resource Strain (CARDIA)    Difficulty of Paying Living  Expenses: Patient declined  Food Insecurity: No Food Insecurity (08/05/2022)   Hunger Vital Sign    Worried About Running Out of Food in the Last Year: Never true    Ran Out of Food in the Last Year: Never true  Transportation Needs: No Transportation Needs (11/04/2021)   PRAPARE - Administrator, Civil Service (Medical): No    Lack of Transportation (Non-Medical): No  Physical Activity: Insufficiently Active (08/05/2022)   Exercise Vital Sign    Days of Exercise per Week: 3 days    Minutes of Exercise per Session: 30 min  Stress: No Stress Concern Present (08/05/2022)   [employer] of Occupational Health - Occupational Stress  Questionnaire    Feeling of Stress : Not at all  Social Connections: Socially Integrated (08/05/2022)   Social Connection and Isolation Panel [NHANES]    Frequency of Communication with Friends and Family: More than three times a week    Frequency of Social Gatherings with Friends and Family: More than three times a week    Attends Religious Services: More than 4 times per year    Active Member of [employer] or Organizations: Yes    Attends Engineer, structural: More than 4 times per year    Marital Status: Married    Tobacco Counseling Counseling given: Not Answered   Clinical Intake:  Pre-visit preparation completed: No  Pain : No/denies pain     BMI - recorded: 23.41 Nutritional Status: BMI of 19-24  Normal Nutritional Risks: None Diabetes: No  How often do you need to have someone help you when you read instructions, pamphlets, or other written materials from your doctor or pharmacy?: 1 - Never  Diabetic?  No  Interpreter Needed?: No  Information entered by :: Theresa Mulligan LPN   Activities of Daily Living    08/05/2022    8:56 AM 04/07/2022    8:57 AM  In your present state of health, do you have any difficulty performing the following activities:  Hearing? 0 0  Vision? 0 0  Difficulty concentrating or making  decisions? 0 0  Walking or climbing stairs? 0 0  Dressing or bathing? 0 0  Doing errands, shopping? 0 0  Preparing Food and eating ? N   Using the Toilet? N   In the past six months, have you accidently leaked urine? N   Do you have problems with loss of bowel control? N   Managing your Medications? N   Managing your Finances? N   Housekeeping or managing your Housekeeping? N     Patient Care Team: [place], Betty G, MD as PCP - General (Family Medicine) Jodelle Red, MD as PCP - Cardiology (Cardiology) Quintella Reichert, MD as Consulting Physician (Cardiology)  Indicate any recent Medical Services you may have received from other than [facility] providers in the past year (date may be approximate).     Assessment:   This is a routine wellness examination for Beec[place].  Hearing/Vision screen Hearing Screening - Comments:: Denies hearing difficulties   Vision Screening - Comments:: Wears rx glasses - up to date with routine eye exams with  Dr Honor Loh  Dietary issues and exercise activities discussed: Exercise limited by: None identified   Goals Addressed               This Visit's Progress     Eat less sugar (pt-stated)         Depression Screen    08/05/2022    8:56 AM 05/08/2022    8:54 AM 04/07/2022    8:56 AM 01/06/2022   10:47 AM 11/05/2021    9:26 AM 06/12/2020   11:08 AM 06/12/2020   11:01 AM  PHQ 2/9 Scores  PHQ - 2 Score 0 0 0 0 0 0 0    Fall Risk    08/05/2022    8:57 AM 05/08/2022    8:54 AM 04/07/2022    8:56 AM 01/06/2022   10:46 AM 11/05/2021    9:26 AM  Fall Risk   Falls in the past year? 0 0 0 0 0  Number falls in past yr: 0 0 0 0 0  Injury with Fall? 0 0 0 0  0  Risk for fall due to : No Fall Risks Other (Comment) Other (Comment) No Fall Risks No Fall Risks  Follow up Falls prevention discussed Falls evaluation completed Falls evaluation completed Falls evaluation completed Falls evaluation completed    FALL RISK PREVENTION PERTAINING TO THE  HOME:  Any stairs in or around the home? Yes  If so, are there any without handrails? No  Home free of loose throw rugs in walkways, pet beds, electrical cords, etc? Yes  Adequate lighting in your home to reduce risk of falls? Yes   ASSISTIVE DEVICES UTILIZED TO PREVENT FALLS:  Life alert? No  Use of a cane, walker or w/c? No  Grab bars in the bathroom? Yes Shower chair or bench in shower? No  Elevated toilet seat or a handicapped toilet? Yes  TIMED UP AND GO:  Was the test performed? No . Audio visit   Cognitive Function:        08/05/2022    8:58 AM  6CIT Screen  What Year? 0 points  What month? 0 points  What time? 0 points  Count back from 20 0 points  Months in reverse 0 points  Repeat phrase 0 points  Total Score 0 points    Immunizations Immunization History  Administered Date(s) Administered   Covid-19, Mrna,Vaccine(Spikevax)25yrs and older 11/26/2021   Fluad Quad(high Dose 65+) 11/03/2018, 11/26/2021   Influenza Split 12/16/2013   Influenza, High Dose Seasonal PF 11/29/2016, 12/26/2017, 11/21/2019   Influenza,inj,Quad PF,6+ Mos 12/24/2014, 12/02/2015   Influenza-Unspecified 11/29/2016, 11/26/2020, 11/26/2021   PFIZER Comirnaty(Gray Top)Covid-19 Tri-Sucrose Vaccine 06/17/2020   PFIZER(Purple Top)SARS-COV-2 Vaccination 03/28/2019, 04/18/2019, 12/03/2019, 11/26/2021   Pfizer Covid-19 Vaccine Bivalent Booster 65yrs & up 12/16/2020   Pneumococcal Conjugate-13 04/07/2016   Pneumococcal Polysaccharide-23 03/10/2017   Tdap 06/04/2017, 08/04/2022    TDAP status: Up to date  Flu Vaccine status: Up to date  Pneumococcal vaccine status: Up to date  Covid-19 vaccine status: Completed vaccines  Qualifies for Shingles Vaccine? Yes   Zostavax completed No   Shingrix Completed?: No.    Education has been provided regarding the importance of this vaccine. Patient has been advised to call insurance company to determine out of pocket expense if they have not yet  received this vaccine. Advised may also receive vaccine at local pharmacy or Health Dept. Verbalized acceptance and understanding.  Screening Tests Health Maintenance  Topic Date Due   COVID-19 Vaccine (7 - 2023-24 season) 08/21/2022 (Originally 01/21/2022)   Zoster Vaccines- Shingrix (1 of 2) 03/12/2023 (Originally 01/13/1970)   MAMMOGRAM  09/24/2022   INFLUENZA VACCINE  10/08/2022   Medicare Annual Wellness (AWV)  08/05/2023   Colonoscopy  08/19/2024   DTaP/Tdap/Td (3 - Td or Tdap) 08/03/2032   Pneumonia Vaccine 66+ Years old  Completed   DEXA SCAN  Completed   Hepatitis C Screening  Completed   HPV VACCINES  Aged Out    Health Maintenance  There are no preventive care reminders to display for this patient.   Colorectal cancer screening: Type of screening: Colonoscopy. Completed 08/20/14. Repeat every 10 years  Mammogram status: Completed 09/23/21. Repeat every year  Bone Density status: Completed 12/03/20. Results reflect: Bone density results: OSTEOPOROSIS. Repeat every   years.  Lung Cancer Screening: (Low Dose CT Chest recommended if Age 52-80 years, 30 pack-year currently smoking OR have quit w/in 15years.) does not qualify.     Additional Screening:  Hepatitis C Screening: does qualify; Completed 10/16/20  Vision Screening: Recommended annual ophthalmology exams for early  detection of glaucoma and other disorders of the eye. Is the patient up to date with their annual eye exam?  Yes  Who is the provider or what is the name of the office in which the patient attends annual eye exams? Dr Honor Loh If pt is not established with a provider, would they like to be referred to a provider to establish care? No .   Dental Screening: Recommended annual dental exams for proper oral hygiene  Community Resource Referral / Chronic Care Management:  CRR required this visit?  No   CCM required this visit?  No      Plan:     I have personally reviewed and noted the following in  the patient's chart:   Medical and social history Use of alcohol, tobacco or illicit drugs  Current medications and supplements including opioid prescriptions. Patient is not currently taking opioid prescriptions. Functional ability and status Nutritional status Physical activity Advanced directives List of other physicians Hospitalizations, surgeries, and ER visits in previous 12 months Vitals Screenings to include cognitive, depression, and falls Referrals and appointments  In addition, I have reviewed and discussed with patient certain preventive protocols, quality metrics, and best practice recommendations. A written personalized care plan for preventive services as well as general preventive health recommendations were provided to patient.     Debra Rung, LPN   9/60/[id number]   Nurse Notes:   None

## 2022-08-12 ENCOUNTER — Other Ambulatory Visit (HOSPITAL_BASED_OUTPATIENT_CLINIC_OR_DEPARTMENT_OTHER): Payer: Self-pay | Admitting: Cardiology

## 2022-08-12 DIAGNOSIS — I7121 Aneurysm of the ascending aorta, without rupture: Secondary | ICD-10-CM

## 2022-08-12 DIAGNOSIS — I10 Essential (primary) hypertension: Secondary | ICD-10-CM

## 2022-08-12 NOTE — Telephone Encounter (Signed)
Rx request sent to pharmacy.  

## 2022-08-13 ENCOUNTER — Other Ambulatory Visit (HOSPITAL_BASED_OUTPATIENT_CLINIC_OR_DEPARTMENT_OTHER): Payer: Self-pay | Admitting: Cardiology

## 2022-08-13 DIAGNOSIS — E7800 Pure hypercholesterolemia, unspecified: Secondary | ICD-10-CM

## 2022-08-13 NOTE — Telephone Encounter (Signed)
Rx request sent to pharmacy.  

## 2022-08-21 ENCOUNTER — Other Ambulatory Visit: Payer: Self-pay | Admitting: Family Medicine

## 2022-08-21 DIAGNOSIS — Z1231 Encounter for screening mammogram for malignant neoplasm of breast: Secondary | ICD-10-CM

## 2022-09-29 ENCOUNTER — Ambulatory Visit
Admission: RE | Admit: 2022-09-29 | Discharge: 2022-09-29 | Disposition: A | Discharge status: Home / Self Care | Payer: Medicare PPO | Source: Ambulatory Visit | Attending: Family Medicine | Admitting: Family Medicine

## 2022-09-29 DIAGNOSIS — Z1231 Encounter for screening mammogram for malignant neoplasm of breast: Secondary | ICD-10-CM | POA: Diagnosis not present

## 2022-11-16 ENCOUNTER — Inpatient Hospital Stay: Payer: Medicare PPO | Attending: Oncology

## 2022-11-16 DIAGNOSIS — D509 Iron deficiency anemia, unspecified: Secondary | ICD-10-CM | POA: Diagnosis not present

## 2022-11-16 DIAGNOSIS — Q231 Congenital insufficiency of aortic valve: Secondary | ICD-10-CM | POA: Diagnosis not present

## 2022-11-16 DIAGNOSIS — Z85060 Personal history of malignant carcinoid tumor of small intestine: Secondary | ICD-10-CM | POA: Diagnosis present

## 2022-11-16 DIAGNOSIS — C7A019 Malignant carcinoid tumor of the small intestine, unspecified portion: Secondary | ICD-10-CM

## 2022-11-16 DIAGNOSIS — I712 Thoracic aortic aneurysm, without rupture, unspecified: Secondary | ICD-10-CM | POA: Insufficient documentation

## 2022-11-17 LAB — CHROMOGRANIN A: Chromogranin A (ng/mL): 97.6 ng/mL (ref 0.0–101.8)

## 2022-11-20 ENCOUNTER — Telehealth: Payer: Self-pay | Admitting: Cardiology

## 2022-11-20 MED ORDER — METOPROLOL SUCCINATE ER 50 MG PO TB24: 50.0000 mg | ORAL_TABLET | Freq: Every day | ORAL | 3 refills | Status: DC

## 2022-11-20 NOTE — Telephone Encounter (Signed)
Pt c/o medication issue:  1. Name of Medication:    2. How are you currently taking this medication (dosage and times per day)? Take 1 tablet (50 mg total) by mouth daily. Dispense: 90 tablet, Refills: 3 ordered   3. Are you having a reaction (difficulty breathing--STAT)? No  4. What is your medication issue? Pt states she would like to start taking this medication again because when she does not take it she states she feels light headed. Please advise

## 2022-11-20 NOTE — Telephone Encounter (Signed)
Per patient at last visit she requested decreasing her amount of medications Dr Cristal Deer agreed and was going to have patient taper off Toprol  When patient decreased her Toprol to every other day started began having lightheadedness Went back on the full dose and improved  Does not monitor blood pressure at home She would like to get an updated Rx sent to [facility] Discussed with Dr Cristal Deer and ok for patient to continue.

## 2022-11-26 ENCOUNTER — Inpatient Hospital Stay: Payer: Medicare PPO | Admitting: Oncology

## 2022-11-26 ENCOUNTER — Other Ambulatory Visit (HOSPITAL_BASED_OUTPATIENT_CLINIC_OR_DEPARTMENT_OTHER): Payer: Self-pay

## 2022-11-26 VITALS — BP 103/82 | HR 71 | Temp 98.1°F | Resp 18 | Ht 62.0 in | Wt 123.0 lb

## 2022-11-26 DIAGNOSIS — I712 Thoracic aortic aneurysm, without rupture, unspecified: Secondary | ICD-10-CM | POA: Diagnosis not present

## 2022-11-26 DIAGNOSIS — Q231 Congenital insufficiency of aortic valve: Secondary | ICD-10-CM | POA: Diagnosis not present

## 2022-11-26 DIAGNOSIS — D509 Iron deficiency anemia, unspecified: Secondary | ICD-10-CM | POA: Diagnosis not present

## 2022-11-26 DIAGNOSIS — C7A019 Malignant carcinoid tumor of the small intestine, unspecified portion: Secondary | ICD-10-CM

## 2022-11-26 DIAGNOSIS — Z85060 Personal history of malignant carcinoid tumor of small intestine: Secondary | ICD-10-CM | POA: Diagnosis not present

## 2022-11-26 MED ORDER — COVID-19 MRNA VAC-TRIS(PFIZER) 30 MCG/0.3ML IM SUSY
0.3000 mL | PREFILLED_SYRINGE | Freq: Once | INTRAMUSCULAR | 0 refills | Status: AC
  Filled 2022-11-26: qty 0.3, 1d supply, fill #0

## 2022-11-26 MED ORDER — INFLUENZA VAC A&B SURF ANT ADJ 0.5 ML IM SUSY
0.5000 mL | PREFILLED_SYRINGE | Freq: Once | INTRAMUSCULAR | 0 refills | Status: AC
  Filled 2022-11-26: qty 0.5, 1d supply, fill #0

## 2022-11-26 NOTE — Progress Notes (Signed)
  [facility] OFFICE PROGRESS NOTE   Diagnosis: Carcinoid tumor  INTERVAL HISTORY:   [name] returns as scheduled.  She feels well.  Good appetite.  She reports intentional weight loss.  She reports only 2 episodes of diarrhea in the past 8-9 months.  This is the best the diarrhea has been since she underwent small bowel surgery.  No new complaint.  Objective:  Vital signs in last 24 hours:  Blood pressure 103/82, pulse 71, temperature 98.1 F (36.7 C), temperature source Temporal, resp. rate 18, height 5\' 2"  (1.575 m), weight 123 lb (55.8 kg), SpO2 100%.    Lymphatics: No cervical, supraclavicular, axillary, or inguinal nodes Resp: Lungs clear bilaterally Cardio: Regular rate and rhythm 2/6 systolic murmur GI: No hepatosplenomegaly, nontender, no mass Vascular: No leg edema  Lab Results:  Lab Results  Component Value Date   WBC 6.2 11/29/2019   HGB 12.3 11/29/2019   HCT 38.2 11/29/2019   MCV 88.4 11/29/2019   PLT 199 11/29/2019   NEUTROABS 4.5 05/13/2015    CMP  Lab Results  Component Value Date   NA 141 04/07/2022   K 4.3 04/07/2022   CL 106 04/07/2022   CO2 27 04/07/2022   GLUCOSE 90 04/07/2022   BUN 16 04/07/2022   CREATININE 0.60 04/07/2022   CALCIUM 9.1 04/07/2022   PROT 6.3 04/07/2022   ALBUMIN 4.2 04/07/2022   AST 17 04/07/2022   ALT 11 04/07/2022   ALKPHOS 98 04/07/2022   BILITOT 1.7 (H) 04/07/2022   GFRNONAA >60 11/29/2019   GFRAA >60 11/29/2019   11/16/2022: Chromogranin A 97.6  Medications: I have reviewed the patient's current medications.   Assessment/Plan:  Carcinoid tumors involving the the ileum, status post a small bowel resection 05/14/2014 with 1.4 cm and 1.1 cm tumors noted,T4Nx The larger tumor showed focal involvement of the serosal surface, resection margins negative, lymphovascular invasion present Persistent abdominal pain and nausea, CT showed evidence of a distal small bowel obstruction 10/03/2014 with an  octreotide scan 10/17/2014 confirming positive uptake in a mesenteric lymph node Small bowel resection 11/15/2014 confirmed multiple small bowel neuroendocrine tumors, grade 2, with metastatic tumor involving 4 lymph nodes   History of intermittent muscle pain and nausea/vomiting secondary to bowel obstruction from #1   3.   History of Microcytic anemia-secondary to iron deficiency, likely secondary to bleeding from the small bowel neuroendocrine tumors    4.   Thoracic Aortic aneurysm   5.   Bicuspid aortic valve    Disposition: [name] remains in clinical remission from the carcinoid tumor.  There is no clinical evidence of recurrent disease.  The chromogranin a level was in the upper normal range on 11/16/2022.  We discussed repeating a chromogranin a level in 6 months.  She is comfortable continuing with a yearly chromogranin a level.  She will call for new symptoms.  She will return for an office visit in 1 year.  Thornton Papas, MD  11/26/2022  9:58 AM

## 2023-03-23 ENCOUNTER — Telehealth: Payer: Self-pay | Admitting: *Deleted

## 2023-03-23 DIAGNOSIS — C7A019 Malignant carcinoid tumor of the small intestine, unspecified portion: Secondary | ICD-10-CM

## 2023-03-23 NOTE — Telephone Encounter (Signed)
 Debra [name] call to report she is having recurring symptoms and wants an appointment. Called back this afternoon and left VM for her to call office again.

## 2023-03-24 NOTE — Telephone Encounter (Signed)
 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 Diarrhea 2-3/week Lower abdominal pain since mid-December-intermittent Nausea few times week More fatigue No flushes or sweats. Asking to be seen to evaluate.

## 2023-03-25 NOTE — Telephone Encounter (Signed)
Per Dr. Truett Perna: Schedule for lab/OV in next 2 weeks with him or NP. Scheduling message sent.

## 2023-03-30 ENCOUNTER — Inpatient Hospital Stay (HOSPITAL_BASED_OUTPATIENT_CLINIC_OR_DEPARTMENT_OTHER): Payer: Medicare PPO | Admitting: Nurse Practitioner

## 2023-03-30 ENCOUNTER — Inpatient Hospital Stay: Payer: Medicare PPO | Attending: Nurse Practitioner

## 2023-03-30 ENCOUNTER — Encounter: Payer: Self-pay | Admitting: Nurse Practitioner

## 2023-03-30 VITALS — BP 139/82 | HR 75 | Temp 98.2°F | Resp 18 | Ht 62.0 in | Wt 124.0 lb

## 2023-03-30 DIAGNOSIS — Q2381 Bicuspid aortic valve: Secondary | ICD-10-CM | POA: Diagnosis not present

## 2023-03-30 DIAGNOSIS — D509 Iron deficiency anemia, unspecified: Secondary | ICD-10-CM | POA: Diagnosis not present

## 2023-03-30 DIAGNOSIS — R110 Nausea: Secondary | ICD-10-CM | POA: Insufficient documentation

## 2023-03-30 DIAGNOSIS — C7A012 Malignant carcinoid tumor of the ileum: Secondary | ICD-10-CM | POA: Insufficient documentation

## 2023-03-30 DIAGNOSIS — C7B01 Secondary carcinoid tumors of distant lymph nodes: Secondary | ICD-10-CM | POA: Diagnosis not present

## 2023-03-30 DIAGNOSIS — C7A019 Malignant carcinoid tumor of the small intestine, unspecified portion: Secondary | ICD-10-CM

## 2023-03-30 DIAGNOSIS — I712 Thoracic aortic aneurysm, without rupture, unspecified: Secondary | ICD-10-CM | POA: Diagnosis not present

## 2023-03-30 DIAGNOSIS — R197 Diarrhea, unspecified: Secondary | ICD-10-CM | POA: Diagnosis not present

## 2023-03-30 DIAGNOSIS — H2511 Age-related nuclear cataract, right eye: Secondary | ICD-10-CM | POA: Diagnosis not present

## 2023-03-30 DIAGNOSIS — H5213 Myopia, bilateral: Secondary | ICD-10-CM | POA: Diagnosis not present

## 2023-03-30 LAB — CMP (CANCER CENTER ONLY)
ALT: 8 U/L (ref 0–44)
AST: 15 U/L (ref 15–41)
Albumin: 4.4 g/dL (ref 3.5–5.0)
Alkaline Phosphatase: 98 U/L (ref 38–126)
Anion gap: 7 (ref 5–15)
BUN: 14 mg/dL (ref 8–23)
CO2: 26 mmol/L (ref 22–32)
Calcium: 9 mg/dL (ref 8.9–10.3)
Chloride: 106 mmol/L (ref 98–111)
Creatinine: 0.58 mg/dL (ref 0.44–1.00)
GFR, Estimated: >60 mL/min (ref >60)
Glucose, Bld: 92 mg/dL (ref 70–99)
Potassium: 4.1 mmol/L (ref 3.5–5.1)
Sodium: 139 mmol/L (ref 135–145)
Total Bilirubin: 2.3 mg/dL — ABNORMAL HIGH (ref 0.0–1.2)
Total Protein: 6.3 g/dL — ABNORMAL LOW (ref 6.5–8.1)

## 2023-03-30 LAB — CBC WITH DIFFERENTIAL (CANCER CENTER ONLY)
Abs Immature Granulocytes: 0.03 10*3/uL (ref 0.00–0.07)
Basophils Absolute: 0 10*3/uL (ref 0.0–0.1)
Basophils Relative: 0 %
Eosinophils Absolute: 0.1 10*3/uL (ref 0.0–0.5)
Eosinophils Relative: 1 %
HCT: 39.7 % (ref 36.0–46.0)
Hemoglobin: 12.7 g/dL (ref 12.0–15.0)
Immature Granulocytes: 0 %
Lymphocytes Relative: 27 %
Lymphs Abs: 1.8 10*3/uL (ref 0.7–4.0)
MCH: 27.5 pg (ref 26.0–34.0)
MCHC: 32 g/dL (ref 30.0–36.0)
MCV: 85.9 fL (ref 80.0–100.0)
Monocytes Absolute: 0.7 10*3/uL (ref 0.1–1.0)
Monocytes Relative: 10 %
Neutro Abs: 4.2 10*3/uL (ref 1.7–7.7)
Neutrophils Relative %: 62 %
Platelet Count: 215 10*3/uL (ref 150–400)
RBC: 4.62 MIL/uL (ref 3.87–5.11)
RDW: 13.5 % (ref 11.5–15.5)
WBC Count: 6.9 10*3/uL (ref 4.0–10.5)
nRBC: 0 % (ref 0.0–0.2)

## 2023-03-30 NOTE — Progress Notes (Signed)
  [facility] OFFICE PROGRESS NOTE   Diagnosis: Carcinoid tumor  INTERVAL HISTORY:   Debra [name] returns prior to scheduled follow-up.  She recently contacted the office to report diarrhea 2-3 times per week, lower abdominal pain, intermittent nausea, increased fatigue.  Beginning in December 2024 she began having single large-volume loose stools intermittently.  She estimates 5 or 6 episodes in December.  This pattern has continued into January.  2 times during the night she woke up with diarrhea.  Around the same time the loose stool started she began having intermittent lower abdominal pain, intermittent nausea and more fatigue.  She has chronic low back pain which she thinks is worse.  No fever.  No flushing episodes.  No vomiting.  No shortness of breath.  Appetite has been diminished.  She estimates losing a few pounds.  She is feels her current symptoms are very similar to what she experienced in 2016.  Objective:  Vital signs in last 24 hours:  Blood pressure (!) 157/61, pulse 75, temperature 98.2 F (36.8 C), temperature source Temporal, resp. rate 18, height 5\' 2"  (1.575 m), weight 124 lb (56.2 kg), SpO2 100%.    Lymphatics: No palpable cervical, supraclavicular, axillary or inguinal lymph nodes. Resp: Lungs clear bilaterally. Cardio: Regular rate and rhythm. GI: Abdomen soft and nontender.  No hepatosplenomegaly.  No mass. Vascular: No leg edema. Neuro: Alert and oriented. Skin: No rash.   Lab Results:  Lab Results  Component Value Date   WBC 6.2 11/29/2019   HGB 12.3 11/29/2019   HCT 38.2 11/29/2019   MCV 88.4 11/29/2019   PLT 199 11/29/2019   NEUTROABS 4.5 05/13/2015    Imaging:  No results found.  Medications: I have reviewed the patient's current medications.  Assessment/Plan: Carcinoid tumors involving the the ileum, status post a small bowel resection 05/14/2014 with 1.4 cm and 1.1 cm tumors noted,T4Nx The larger tumor showed focal  involvement of the serosal surface, resection margins negative, lymphovascular invasion present Persistent abdominal pain and nausea, CT showed evidence of a distal small bowel obstruction 10/03/2014 with an octreotide scan 10/17/2014 confirming positive uptake in a mesenteric lymph node Small bowel resection 11/15/2014 confirmed multiple small bowel neuroendocrine tumors, grade 2, with metastatic tumor involving 4 lymph nodes   History of intermittent muscle pain and nausea/vomiting secondary to bowel obstruction from #1   3.   History of Microcytic anemia-secondary to iron deficiency, likely secondary to bleeding from the small bowel neuroendocrine tumors    4.   Thoracic Aortic aneurysm   5.   Bicuspid aortic valve    Disposition: Debra [name] has a history of carcinoid tumors involving the small bowel.  Over the past few months that she has developed intermittent loose stools, nausea, abdominal pain.  She reports similar symptoms when she was initially diagnosed.  Chromogranin A level is pending.  We are referring her for CTs abdomen/pelvis.  She will return for follow-up in approximately 2 weeks.  We are available to see her sooner if needed.    Lonna Cobb ANP/GNP-BC   03/30/2023  1:52 PM

## 2023-04-01 LAB — CHROMOGRANIN A: Chromogranin A (ng/mL): 74 ng/mL (ref 0.0–101.8)

## 2023-04-07 ENCOUNTER — Encounter (HOSPITAL_COMMUNITY): Payer: Self-pay

## 2023-04-07 ENCOUNTER — Ambulatory Visit (HOSPITAL_COMMUNITY)
Admission: RE | Admit: 2023-04-07 | Discharge: 2023-04-07 | Disposition: A | Discharge status: Home / Self Care | Payer: Medicare PPO | Source: Ambulatory Visit | Attending: Nurse Practitioner | Admitting: Nurse Practitioner

## 2023-04-07 DIAGNOSIS — C7A019 Malignant carcinoid tumor of the small intestine, unspecified portion: Secondary | ICD-10-CM | POA: Diagnosis not present

## 2023-04-07 DIAGNOSIS — R109 Unspecified abdominal pain: Secondary | ICD-10-CM | POA: Diagnosis not present

## 2023-04-07 MED ORDER — IOHEXOL 300 MG/ML  SOLN
100.0000 mL | Freq: Once | INTRAMUSCULAR | Status: AC | PRN
  Administered 2023-04-07: 100 mL via INTRAVENOUS

## 2023-04-07 MED ORDER — IOHEXOL 300 MG/ML  SOLN
30.0000 mL | Freq: Once | INTRAMUSCULAR | Status: AC | PRN
  Administered 2023-04-07: 30 mL via ORAL

## 2023-04-09 ENCOUNTER — Inpatient Hospital Stay: Payer: Medicare PPO | Admitting: Nurse Practitioner

## 2023-04-09 VITALS — BP 129/66 | HR 72 | Temp 98.1°F | Resp 18 | Ht 62.0 in | Wt 122.4 lb

## 2023-04-09 DIAGNOSIS — I712 Thoracic aortic aneurysm, without rupture, unspecified: Secondary | ICD-10-CM | POA: Diagnosis not present

## 2023-04-09 DIAGNOSIS — C7A012 Malignant carcinoid tumor of the ileum: Secondary | ICD-10-CM | POA: Diagnosis not present

## 2023-04-09 DIAGNOSIS — D509 Iron deficiency anemia, unspecified: Secondary | ICD-10-CM | POA: Diagnosis not present

## 2023-04-09 DIAGNOSIS — C7B01 Secondary carcinoid tumors of distant lymph nodes: Secondary | ICD-10-CM | POA: Diagnosis not present

## 2023-04-09 DIAGNOSIS — R197 Diarrhea, unspecified: Secondary | ICD-10-CM | POA: Diagnosis not present

## 2023-04-09 DIAGNOSIS — R110 Nausea: Secondary | ICD-10-CM | POA: Diagnosis not present

## 2023-04-09 DIAGNOSIS — Q2381 Bicuspid aortic valve: Secondary | ICD-10-CM | POA: Diagnosis not present

## 2023-04-09 DIAGNOSIS — C7A019 Malignant carcinoid tumor of the small intestine, unspecified portion: Secondary | ICD-10-CM | POA: Diagnosis not present

## 2023-04-09 NOTE — Progress Notes (Signed)
[facility] OFFICE PROGRESS NOTE   Diagnosis: Carcinoid tumor  INTERVAL HISTORY:   Debra [name] returns as scheduled.  Symptoms she was experiencing at the time of her last visit have resolved.  This includes the loose stools, lower abdominal pain, nausea and fatigue.  She is feeling well.  No flushing episodes.  She has a good appetite.  Last colonoscopy was 8 to 9 years ago.  Objective:  Vital signs in last 24 hours:  Blood pressure 129/66, pulse 72, temperature 98.1 F (36.7 C), temperature source Temporal, resp. rate 18, height 5\' 2"  (1.575 m), weight 122 lb 6.4 oz (55.5 kg), SpO2 100%.    HEENT: No thrush or ulcers.  Sclera anicteric. Resp: Lungs clear bilaterally. Cardio: Regular rate and rhythm. GI: Abdomen soft and nontender.  No hepatosplenomegaly. Vascular: No leg edema.   Lab Results:  Lab Results  Component Value Date   WBC 6.9 03/30/2023   HGB 12.7 03/30/2023   HCT 39.7 03/30/2023   MCV 85.9 03/30/2023   PLT 215 03/30/2023   NEUTROABS 4.2 03/30/2023    Imaging:  CT ABDOMEN PELVIS W CONTRAST Result Date: 04/08/2023 CLINICAL DATA:  History of small bowel neuroendocrine tumor status post resection. Patient now complains of intermittent loose stools, nausea and abdominal pain. Restaging. * Tracking Code: BO *. EXAM: CT ABDOMEN AND PELVIS WITH CONTRAST TECHNIQUE: Multidetector CT imaging of the abdomen and pelvis was performed using the standard protocol following bolus administration of intravenous contrast. RADIATION DOSE REDUCTION: This exam was performed according to the departmental dose-optimization program which includes automated exposure control, adjustment of the mA and/or kV according to patient size and/or use of iterative reconstruction technique. CONTRAST:  OMNIPAQUE IOHEXOL 300 MG/ML  SOLN COMPARISON:  CT enterography 10/03/2014. FINDINGS: Lower chest: No acute abnormality. Hepatobiliary: No focal liver abnormality is seen. No  gallstones, gallbladder wall thickening, or biliary dilatation. Pancreas: Unremarkable. No pancreatic ductal dilatation or surrounding inflammatory changes. Spleen: Normal in size without focal abnormality. Adrenals/Urinary Tract: Normal adrenal glands. No nephrolithiasis, hydronephrosis or mass. Urinary bladder is unremarkable. Stomach/Bowel: Stomach appears normal. No signs of small bowel obstruction. There is enteric contrast material noted up to the level of the proximal transverse colon. No pathologic dilatation of the large or small bowel loops. Focal short segment of small bowel at the level of the anastomosis is dilated likely reflecting postsurgical changes with possible aganglionic segment of small bowel. This is seen on image 59 of series 2. Here, there are no specific findings identified to suggest locally recurrent tumor. Terminal ileum appears normal. Vascular/Lymphatic: Aortic atherosclerosis. There are scattered non pathologically enlarged mesenteric lymph nodes with short axis measuring up to 6 mm. No enlarged abdominopelvic lymph nodes identified. Reproductive: Status post hysterectomy.  No adnexal mass identified. Other: No ascites or focal fluid collections. No signs of pneumoperitoneum. Musculoskeletal: Left hip arthroplasty. Anterolisthesis of L4 on L5 measures 7 mm, image 50/5. Previous left hip arthroplasty. IMPRESSION: 1. No specific findings to suggest recurrent tumor or metastatic disease. If there is a high clinical concern for recurrent neuroendocrine tumor consider further evaluation with DOTATATE PET. 2. No signs of small bowel obstruction. Focal short segment of small bowel at the level of the anastomosis is dilated likely reflecting postsurgical changes with possible aganglionic segment of small bowel. Here, there are no specific findings identified to suggest locally recurrent tumor. 3.  Aortic Atherosclerosis (ICD10-I70.0). Electronically Signed   By: Signa Kell M.D.   On:  04/08/2023 15:20  Medications: I have reviewed the patient's current medications.  Assessment/Plan: Carcinoid tumors involving the the ileum, status post a small bowel resection 05/14/2014 with 1.4 cm and 1.1 cm tumors noted,T4Nx The larger tumor showed focal involvement of the serosal surface, resection margins negative, lymphovascular invasion present Persistent abdominal pain and nausea, CT showed evidence of a distal small bowel obstruction 10/03/2014 with an octreotide scan 10/17/2014 confirming positive uptake in a mesenteric lymph node Small bowel resection 11/15/2014 confirmed multiple small bowel neuroendocrine tumors, grade 2, with metastatic tumor involving 4 lymph nodes 03/30/2023 Chromogranin A level-74  CTs 04/07/2023-no specific findings to suggest recurrent tumor or metastatic disease.  No signs of small bowel obstruction.  Focal short segment of small bowel at the level of the anastomosis is dilated, likely reflecting postsurgical change.   History of intermittent muscle pain and nausea/vomiting secondary to bowel obstruction from #1   3.   History of Microcytic anemia-secondary to iron deficiency, likely secondary to bleeding from the small bowel neuroendocrine tumors    4.   Thoracic Aortic aneurysm   5.   Bicuspid aortic valve  6.    Chronic mild elevation of bilirubin-question Gilbert's syndrome    Disposition: Debra [name] appears stable.  The symptoms she was experiencing a few weeks ago have resolved.  We reviewed the CT report from 04/07/2023 with her and her husband at today's visit.  They understand there are no findings to suggest recurrent/metastatic disease.  The chromogranin A level returned in normal range.  She will contact the office if symptoms recur.  Bilirubin has been mildly elevated for the past several years.  Question Gilbert's syndrome.  Recommend she follow-up with Dr. [place] for continued monitoring.  She will return for follow-up as scheduled  September 2025.  We are available to see her sooner if needed.  Patient seen with Dr. Truett Perna.  Lonna Cobb ANP/GNP-BC   04/09/2023  11:34 AM  This was a shared visit with Lonna Cobb.  Debra [name] was interviewed and examined.  The etiology of the recent abdominal discomfort and increase stool frequency is unclear.  The chromogranin a is normal and the CT Abdo/pelvis reveals no evidence of recurrent disease.  The plan is to continue observation.  Chronic mild elevation of the bilirubin is likely unrelated to the carcinoid diagnosis.  She most likely has Gilbert's.  I was present for greater than 50% of today's visit.  I performed medical decision making.  Mancel Bale, MD

## 2023-05-17 ENCOUNTER — Ambulatory Visit: Admitting: Family Medicine

## 2023-05-17 ENCOUNTER — Ambulatory Visit: Payer: Self-pay | Admitting: Family Medicine

## 2023-05-17 ENCOUNTER — Encounter: Payer: Self-pay | Admitting: Family Medicine

## 2023-05-17 VITALS — BP 120/70 | HR 73 | Temp 98.4°F | Resp 16 | Ht 62.0 in | Wt 122.4 lb

## 2023-05-17 DIAGNOSIS — R21 Rash and other nonspecific skin eruption: Secondary | ICD-10-CM

## 2023-05-17 DIAGNOSIS — R509 Fever, unspecified: Secondary | ICD-10-CM

## 2023-05-17 DIAGNOSIS — U071 COVID-19: Secondary | ICD-10-CM | POA: Diagnosis not present

## 2023-05-17 LAB — POCT INFLUENZA A/B
Influenza A, POC: NEGATIVE
Influenza B, POC: NEGATIVE

## 2023-05-17 LAB — POC COVID19 BINAXNOW: SARS Coronavirus 2 Ag: POSITIVE — AB

## 2023-05-17 MED ORDER — NIRMATRELVIR/RITONAVIR (PAXLOVID)TABLET: 3.0000 | ORAL_TABLET | Freq: Two times a day (BID) | ORAL | 0 refills | Status: AC

## 2023-05-17 NOTE — Telephone Encounter (Signed)
 Copied from CRM [id number]. Topic: Clinical - Red Word Triage >> May 17, 2023 12:25 PM Florestine Avers wrote: Red Word that prompted transfer to Nurse Triage: Patient called in stating that she has flu like symptoms and she now has a rash that is starting to spread all over her body.    Chief Complaint: rash; flu symptoms Symptoms: red "splotchy rash" Frequency: unsure Pertinent Negatives: Patient denies itching, burning, or pain Disposition: [] ED /[] Urgent Care (no appt availability in office) / [x] Appointment(In office/virtual)/ []  [facility]/ [] Home Care/ [] Refused Recommended Disposition /[] [place] [facility]/ []  Follow-up with PCP Additional Notes: Patient unsure of when rash developed, but sts that she has been dealing with flu-like symptoms for 1 week now. Pt concerned about spreading anything to her granddaughter. Appt. schedule  Reason for Disposition  Red, moist, irritated area between skin folds (or under larger breasts)  Answer Assessment - Initial Assessment Questions 1. APPEARANCE of RASH: "Describe the rash."      Flat and red  2. LOCATION: "Where is the rash located?"      Under right breast extending to left armpit  3. NUMBER: "How many spots are there?"      Several spot  4. SIZE: "How big are the spots?" (Inches, centimeters or compare to size of a coin)      Sizes vary,m size of hand, size of thumb print  5. ONSET: "When did the rash start?"      Unsure of start  6. ITCHING: "Does the rash itch?" If Yes, ask: "How bad is the itch?"  (Scale 0-10; or none, mild, moderate, severe)     Does not itch  7. PAIN: "Does the rash hurt?" If Yes, ask: "How bad is the pain?"  (Scale 0-10; or none, mild, moderate, severe)    - NONE (0): no pain    - MILD (1-3): doesn't interfere with normal activities     - MODERATE (4-7): interferes with normal activities or awakens from sleep     - SEVERE (8-10): excruciating pain, unable to do any normal activities      No  8. OTHER SYMPTOMS: "Do you have any other symptoms?" (e.g., fever)     Fatigue, dry cough, chills, intermittent fever  9. PREGNANCY: "Is there any chance you are pregnant?" "When was your last menstrual period?"     No  Protocols used: Rash or Redness - Localized-A-AH

## 2023-05-17 NOTE — Progress Notes (Signed)
 ACUTE VISIT Chief Complaint  Patient presents with   Rash    On stomach    Fever   HPI: [name] is a 73 y.o. female with a PMHx significant for primary carcinoid tumor of small intestine, HTN, CAD, HLD, and ascending aortic aneurysm who is here today complaining of Covid.  She complains of 3 days of fever (101.0 F), chills, dry cough, nausea, fatigue, congestion, sore throat, and a today noted  rash across her upper abdomen abdomen. Her rash is not pruritus, not tender. Negative for new medication, insect bites,or new exposures. No recent travel, hiking,or lake trips.  Fever  This is a new problem. The current episode started in the past 7 days. The problem occurs intermittently. The problem has been gradually improving. The maximum temperature noted was 101 to 101.9 F. Associated symptoms include congestion, coughing, muscle aches, nausea and a sore throat. Pertinent negatives include no abdominal pain, chest pain, diarrhea, sleepiness, urinary pain or vomiting. She has tried acetaminophen for the symptoms.   Sore throat has improved. Negative for wheezing and SOB. She mentions she also had URI symptoms in mid February, and recovered from most of the symptoms prior to this new illness.  Pertinent negatives include any headache or ear discomfort.  Review of Systems  Constitutional:  Positive for activity change, appetite change, chills and fatigue.  HENT:  Positive for congestion and sore throat.   Eyes:  Negative for discharge and redness.  Respiratory:  Positive for cough.   Cardiovascular:  Negative for chest pain and leg swelling.  Gastrointestinal:  Positive for nausea. Negative for abdominal pain, diarrhea and vomiting.  Genitourinary:  Negative for decreased urine volume, dysuria and hematuria.  Musculoskeletal:  Negative for gait problem and joint swelling.  Neurological:  Negative for syncope and facial asymmetry.  Psychiatric/Behavioral:  Negative  for confusion. The patient is not nervous/anxious.   See other pertinent positives and negatives in HPI.  Current Outpatient Medications on File Prior to Visit  Medication Sig Dispense Refill   aspirin EC 81 MG tablet Take 81 mg by mouth daily. Swallow whole.     losartan (COZAAR) 25 MG tablet TAKE 1 TABLET (25 MG TOTAL) BY MOUTH DAILY. 90 tablet 3   simvastatin (ZOCOR) 20 MG tablet TAKE 1 TABLET BY MOUTH DAILY AT 6 PM. 90 tablet 3   metoprolol succinate (TOPROL-XL) 50 MG 24 hr tablet Take 1 tablet (50 mg total) by mouth daily. Take with or immediately following a meal. 90 tablet 3   No current facility-administered medications on file prior to visit.   Past Medical History:  Diagnosis Date   Aortic aneurysm (HCC)    Arthritis    "moderate in back" (11/15/2014)   Bicuspid aortic valve    mild AS by echo 12/2016   Carcinoid tumor of small intestine, malignant (HCC) dx'd 05/2014   Colon cancer (HCC)    NEUROENDOCRINE    Coronary artery disease    nonobstructive   H/O echocardiogram    bicuspid aortic valve with moderate aortic valve sclerosis,mild PR.TR.MR and mildly dilated aorta by echo on 8.2012   Heart murmur    History of kidney stones    Hypertension    Iron deficiency anemia    "can't tolerate the supplements right now" (11/15/2014)   Mild mitral regurgitation    PONV (postoperative nausea and vomiting)    PREOP YEARS AGO WITH HYSTERECTOMY FELT " CREEPY" PRIOR TO SURGERY    Thoracic aortic aneurysm (HCC)  mild, 4.1 cm by recent CT followed by Dr Maren Beach   Allergies  Allergen Reactions   Codeine Anaphylaxis and Shortness Of Breath   Opium Anaphylaxis   Oxycodone Shortness Of Breath   Penicillins Anaphylaxis and Shortness Of Breath   Azithromycin Diarrhea    Pt request not to take     Social History   Socioeconomic History   Marital status: Married    Spouse name: Not on file   Number of children: 2   Years of education: 17   Highest education level: Master's  degree (e.g., MA, MS, MEng, MEd, MSW, MBA)  Occupational History   Not on file  Tobacco Use   Smoking status: Never   Smokeless tobacco: Never  Vaping Use   Vaping status: Never Used  Substance and Sexual Activity   Alcohol use: Yes    Alcohol/week: 4.0 standard drinks of alcohol    Types: 4 Glasses of wine per week    Comment: 5 GLASSES OF WINE PER WEEK    Drug use: No   Sexual activity: Yes  Other Topics Concern   Not on file  Social History Narrative   Married, spouse Debra [name]   Social Drivers of Health   Financial Resource Strain: Patient Declined (11/04/2021)   Overall Financial Resource Strain (CARDIA)    Difficulty of Paying Living Expenses: Patient declined  Food Insecurity: No Food Insecurity (08/05/2022)   Hunger Vital Sign    Worried About Running Out of Food in the Last Year: Never true    Ran Out of Food in the Last Year: Never true  Transportation Needs: No Transportation Needs (11/04/2021)   PRAPARE - Administrator, Civil Service (Medical): No    Lack of Transportation (Non-Medical): No  Physical Activity: Insufficiently Active (08/05/2022)   Exercise Vital Sign    Days of Exercise per Week: 3 days    Minutes of Exercise per Session: 30 min  Stress: No Stress Concern Present (08/05/2022)   [employer] of Occupational Health - Occupational Stress Questionnaire    Feeling of Stress : Not at all  Social Connections: Socially Integrated (08/05/2022)   Social Connection and Isolation Panel [NHANES]    Frequency of Communication with Friends and Family: More than three times a week    Frequency of Social Gatherings with Friends and Family: More than three times a week    Attends Religious Services: More than 4 times per year    Active Member of [employer] or Organizations: Yes    Attends Banker Meetings: More than 4 times per year    Marital Status: Married   Vitals:   05/17/23 1333  BP: 120/70  Pulse: 73  Resp: 16  Temp: 98.4  F (36.9 C)  SpO2: 98%   Body mass index is 22.38 kg/m.  Physical Exam Vitals and nursing note reviewed.  Constitutional:      General: She is not in acute distress.    Appearance: She is well-developed. She is not ill-appearing.  HENT:     Head: Normocephalic and atraumatic.     Right Ear: Tympanic membrane, ear canal and external ear normal.     Left Ear: Tympanic membrane, ear canal and external ear normal.     Mouth/Throat:     Mouth: Mucous membranes are moist.     Pharynx: Oropharynx is clear. Uvula midline.  Eyes:     Conjunctiva/sclera: Conjunctivae normal.  Cardiovascular:     Rate and Rhythm: Regular rhythm.  Heart sounds: Murmur (SEM I/VI RUSB) heard.  Pulmonary:     Effort: Pulmonary effort is normal. No respiratory distress.     Breath sounds: Normal breath sounds. No stridor.  Abdominal:     Palpations: Abdomen is soft. There is no mass.     Tenderness: There is no abdominal tenderness.  Musculoskeletal:     Cervical back: No edema or erythema. No muscular tenderness.  Lymphadenopathy:     Cervical: No cervical adenopathy.  Skin:    General: Skin is warm.     Findings: Rash present. No erythema.       Neurological:     Mental Status: She is alert and oriented to person, place, and time.  Psychiatric:        Mood and Affect: Mood and affect normal.   ASSESSMENT AND PLAN:  Ms. Grimley was seen today for rash, fever, and flu symptoms.   Fever, unspecified Covid 19 test positive today in the office. Negative rapid flu test.  -     POCT Influenza A/B -     POC COVID-19 BinaxNow  COVID-19 virus infection We discussed Dx,possible complications and treatment options. She has a mild case with risk for complications. We discussed oral antiviral options and side effects. She agrees with starting Paxlovid. Instructed  Symptomatic treatment with plenty of fluids,rest,tylenol 500 mg 3-4 times per day prn.  Explained that cough and congestion may last  a few more days and even weeks after acute symptoms have resolved. Clearly instructed about warning signs.  -     nirmatrelvir/ritonavir; Take 3 tablets by mouth 2 (two) times daily for 5 days. (Take nirmatrelvir 150 mg two tablets twice daily for 5 days and ritonavir 100 mg one tablet twice daily for 5 days) Patient GFR is > 60 in 08/2023.  Dispense: 30 tablet; Refill: 0  Rash and nonspecific skin eruption We discussed possible etiologies. Rash is not a common symptom for COVID 19 infection. For now recommend monitoring for changes. Instructed about warning signs.  Return if symptoms worsen or fail to improve, for keep next appointment.  I, Rolla Etienne Wierda, acting as a scribe for Azrael Huss [place], MD., have documented all relevant documentation on the behalf of Jayleana Colberg [place], MD, as directed by  Stevi Hollinshead [place], MD while in the presence of Larrissa Stivers [place], MD.   I, Dannisha Eckmann [place], MD, have reviewed all documentation for this visit. The documentation on 05/17/23 for the exam, diagnosis, procedures, and orders are all accurate and complete.  Sabre Leonetti G. [place], MD  [facility]. [place] [facility].

## 2023-05-17 NOTE — Patient Instructions (Addendum)
 A few things to remember from today's visit:  Fever, unspecified - Plan: POCT Influenza A/B, POC COVID-19  COVID-19 virus infection - Plan: nirmatrelvir/ritonavir (PAXLOVID) 20 x 150 MG & 10 x 100MG  TABS  Rash and nonspecific skin eruption  Monitor for change sin rash. Stop Simvastatin while taking covid medication. Isolation until 48 hours fever free but caution with immunosuppressed people or infants.  If you need refills for medications you take chronically, please call your pharmacy. Do not use My Chart to request refills or for acute issues that need immediate attention. If you send a my chart message, it may take a few days to be addressed, specially if I am not in the office.  Please be sure medication list is accurate. If a new problem present, please set up appointment sooner than planned today.

## 2023-05-24 ENCOUNTER — Ambulatory Visit: Payer: Self-pay | Admitting: Family Medicine

## 2023-05-24 NOTE — Telephone Encounter (Signed)
 Chief Complaint: Question  Disposition:  [x]  Follow-up with PCP  Additional Notes: Pt states she was in the office a week ago for cold symptoms and tested positive for COVID.Marland Kitchen Pt states she has not had a fever for 8 days and wants to know when it is okay to be around her 73 month old and 75 year old grandchild who is not vaccinated. Pt told her PCP will be asked and someone will call her back. Confirmed call back number- [phone]   Reason for Disposition  [1] Caller requesting NON-URGENT health information AND [2] PCP's office is the best resource  Answer Assessment - Initial Assessment Questions 1. REASON FOR CALL or QUESTION: "What is your reason for calling today?" or "How can I best help you?" or "What question do you have that I can help answer?"     Pt states she has not had a fever for 8 days and wants to know when it is okay to be around her 50 month old and 14 year old grandchild who is not vaccinated.  Protocols used: Information Only Call - No Triage-A-AH

## 2023-05-24 NOTE — Telephone Encounter (Signed)
 Left Voicemail with patient to call back to office to speak with Nurse Triage.   Per CDC website-patient can test positive for COVID up to 90 days after positive test. <http://www.cdc.gov.covid/testing/index.html>    Copied From CRM [id number]. Reason for Triage: Patient seen last week and tested positive for COVID - still experiencing fatigue,occasional cough & some congestion - tested again today for Covid and it came back positive - patient wants to confirm if she should come back in.

## 2023-05-24 NOTE — Telephone Encounter (Signed)
 Per PCP okay for pt to be around grandchildren. I called and spoke with her, she is aware.

## 2023-06-15 DIAGNOSIS — H10411 Chronic giant papillary conjunctivitis, right eye: Secondary | ICD-10-CM | POA: Diagnosis not present

## 2023-06-29 ENCOUNTER — Ambulatory Visit: Payer: Self-pay

## 2023-06-29 NOTE — Telephone Encounter (Signed)
 noted

## 2023-06-29 NOTE — Telephone Encounter (Signed)
 Chief Complaint: cough Symptoms: chronic cough Frequency: >6 weeks Pertinent Negatives: Patient denies fever Disposition: [] ED /[] Urgent Care (no appt availability in office) / [x] Appointment(In office/virtual)/ []  [facility]/ [x] Home Care/ [] Refused Recommended Disposition /[] [facility]/ []  Follow-up with PCP Additional Notes: states diagnosed with COVID 6 weeks ago and its not getting better. States she is not taking anything except cough drops. Denies any other symptoms other than the cough. Appt made for next week and home care given.   Copied from CRM [id number]. Topic: Clinical - Red Word Triage >> Jun 29, 2023  1:30 PM Shardie S wrote: [facility] that prompted transfer to Nurse Triage: Increased cough Reason for Disposition  Cough lasts > 3 weeks  Answer Assessment - Initial Assessment Questions 1. ONSET: "When did the cough begin?"      >6 weeks ago 2. SEVERITY: "How bad is the cough today?"      Mild-mod 3. SPUTUM: "Describe the color of your sputum" (none, dry cough; clear, white, yellow, green)    Moist-none 4. HEMOPTYSIS: "Are you coughing up any blood?" If so ask: "How much?" (flecks, streaks, tablespoons, etc.)     none 5. DIFFICULTY BREATHING: "Are you having difficulty breathing?" If Yes, ask: "How bad is it?" (e.g., mild, moderate, severe)    - MILD: No SOB at rest, mild SOB with walking, speaks normally in sentences, can lie down, no retractions, pulse < 100.    - MODERATE: SOB at rest, SOB with minimal exertion and prefers to sit, cannot lie down flat, speaks in phrases, mild retractions, audible wheezing, pulse 100-120.    - SEVERE: Very SOB at rest, speaks in single words, struggling to breathe, sitting hunched forward, retractions, pulse > 120      none 6. FEVER: "Do you have a fever?" If Yes, ask: "What is your temperature, how was it measured, and when did it start?"     none  10. OTHER SYMPTOMS: "Do you have any other symptoms?" (e.g., runny  nose, wheezing, chest pain)       no  Protocols used: Cough - Chronic-A-AH

## 2023-07-06 ENCOUNTER — Ambulatory Visit: Admitting: Family Medicine

## 2023-08-09 ENCOUNTER — Other Ambulatory Visit: Payer: Self-pay | Admitting: Family Medicine

## 2023-08-09 DIAGNOSIS — Z0000 Encounter for general adult medical examination without abnormal findings: Secondary | ICD-10-CM

## 2023-08-10 ENCOUNTER — Ambulatory Visit (HOSPITAL_BASED_OUTPATIENT_CLINIC_OR_DEPARTMENT_OTHER): Admitting: Nurse Practitioner

## 2023-08-11 ENCOUNTER — Other Ambulatory Visit (HOSPITAL_BASED_OUTPATIENT_CLINIC_OR_DEPARTMENT_OTHER): Payer: Self-pay | Admitting: Cardiology

## 2023-08-11 DIAGNOSIS — I7121 Aneurysm of the ascending aorta, without rupture: Secondary | ICD-10-CM

## 2023-08-11 DIAGNOSIS — E7800 Pure hypercholesterolemia, unspecified: Secondary | ICD-10-CM

## 2023-08-11 DIAGNOSIS — I10 Essential (primary) hypertension: Secondary | ICD-10-CM

## 2023-08-16 ENCOUNTER — Encounter: Payer: Self-pay | Admitting: Family Medicine

## 2023-08-16 ENCOUNTER — Ambulatory Visit: Admitting: Family Medicine

## 2023-08-16 VITALS — BP 134/80 | HR 73 | Resp 16 | Ht 62.0 in | Wt 121.1 lb

## 2023-08-16 DIAGNOSIS — M546 Pain in thoracic spine: Secondary | ICD-10-CM

## 2023-08-16 DIAGNOSIS — W19XXXA Unspecified fall, initial encounter: Secondary | ICD-10-CM

## 2023-08-16 MED ORDER — MELOXICAM 7.5 MG PO TABS: 7.5000 mg | ORAL_TABLET | Freq: Every day | ORAL | 0 refills | Status: AC

## 2023-08-16 NOTE — Patient Instructions (Addendum)
 A few things to remember from today's visit:  Fall, initial encounter  Acute left-sided thoracic back pain - Plan: meloxicam (MOBIC) 7.5 MG tablet It does not see to be serious, so we can hold on imaging. Topical icy hot or asper cream may also help. Meloxicam 1-2 times per day as needed and at least 5-6 hours from Aspirin .  If you need refills for medications you take chronically, please call your pharmacy. Do not use My Chart to request refills or for acute issues that need immediate attention. If you send a my chart message, it may take a few days to be addressed, specially if I am not in the office.  Please be sure medication list is accurate. If a new problem present, please set up appointment sooner than planned today.

## 2023-08-16 NOTE — Progress Notes (Signed)
 ACUTE VISIT Chief Complaint  Patient presents with  . Back Pain    Friday evening/Saturday. [name] backwards on step Friday evening    HPI: [name] is a 73 y.o. female with a PMHx significant for primary carcinoid tumor of small intestine, HTN, CAD, HLD, and ascending aortic aneurysm, who is here today complaining of back pain.   Back pain:  Patient complains of left-sided back pain she has had since the night of 6/7 after a fall she had on the evening of 6/6. She says she slipped on the stairs while wearing socks.  The pain is worse when she is standing up or with certain movements, but she is not having any right now. It has been stable since onset.  She rates the pain as a 6-7/10 at its worst.  Has been taking advil for the pain, which helps.  Pertinent negatives include bruising, swelling, sleep interference, or pain with deep breathing.   Review of Systems See other pertinent positives and negatives in HPI.  Current Outpatient Medications on File Prior to Visit  Medication Sig Dispense Refill  . aspirin  EC 81 MG tablet Take 81 mg by mouth daily. Swallow whole.    . losartan  (COZAAR ) 25 MG tablet TAKE 1 TABLET (25 MG TOTAL) BY MOUTH DAILY. 90 tablet 0  . simvastatin  (ZOCOR ) 20 MG tablet TAKE 1 TABLET BY MOUTH DAILY AT 6 PM. 90 tablet 0  . metoprolol  succinate (TOPROL -XL) 50 MG 24 hr tablet Take 1 tablet (50 mg total) by mouth daily. Take with or immediately following a meal. 90 tablet 3   No current facility-administered medications on file prior to visit.   Past Medical History:  Diagnosis Date  . Aortic aneurysm (HCC)   . Arthritis    "moderate in back" (11/15/2014)  . Bicuspid aortic valve    mild AS by echo 12/2016  . Carcinoid tumor of small intestine, malignant (HCC) dx'd 05/2014  . Colon cancer (HCC)    NEUROENDOCRINE   . Coronary artery disease    nonobstructive  . H/O echocardiogram    bicuspid aortic valve with moderate aortic valve  sclerosis,mild PR.TR.MR and mildly dilated aorta by echo on 8.2012  . Heart murmur   . History of kidney stones   . Hypertension   . Iron deficiency anemia    "can't tolerate the supplements right now" (11/15/2014)  . Mild mitral regurgitation   . PONV (postoperative nausea and vomiting)    PREOP YEARS AGO WITH HYSTERECTOMY FELT " CREEPY" PRIOR TO SURGERY   . Thoracic aortic aneurysm (HCC)    mild, 4.1 cm by recent CT followed by Dr Jeb Miner   Allergies  Allergen Reactions  . Codeine Anaphylaxis and Shortness Of Breath  . Opium Anaphylaxis  . Oxycodone Shortness Of Breath  . Penicillins Anaphylaxis and Shortness Of Breath  . Azithromycin  Diarrhea    Pt request not to take    Social History   Socioeconomic History  . Marital status: Married    Spouse name: Not on file  . Number of children: 2  . Years of education: 56  . Highest education level: Master's degree (e.g., MA, MS, MEng, MEd, MSW, MBA)  Occupational History  . Not on file  Tobacco Use  . Smoking status: Never  . Smokeless tobacco: Never  Vaping Use  . Vaping status: Never Used  Substance and Sexual Activity  . Alcohol use: Yes    Alcohol/week: 4.0 standard drinks of alcohol    Types:  4 Glasses of wine per week    Comment: 5 GLASSES OF WINE PER WEEK   . Drug use: No  . Sexual activity: Yes  Other Topics Concern  . Not on file  Social History Narrative   Married, spouse Debra [name]   Social Drivers of Health   Financial Resource Strain: Patient Declined (11/04/2021)   Overall Financial Resource Strain (CARDIA)   . Difficulty of Paying Living Expenses: Patient declined  Food Insecurity: No Food Insecurity (08/05/2022)   Hunger Vital Sign   . Worried About Programme researcher, broadcasting/film/video in the Last Year: Never true   . Ran Out of Food in the Last Year: Never true  Transportation Needs: No Transportation Needs (11/04/2021)   PRAPARE - Transportation   . Lack of Transportation (Medical): No   . Lack of Transportation  (Non-Medical): No  Physical Activity: Insufficiently Active (08/05/2022)   Exercise Vital Sign   . Days of Exercise per Week: 3 days   . Minutes of Exercise per Session: 30 min  Stress: No Stress Concern Present (08/05/2022)   [employer] of Occupational Health - Occupational Stress Questionnaire   . Feeling of Stress : Not at all  Social Connections: Socially Integrated (08/05/2022)   Social Connection and Isolation Panel [NHANES]   . Frequency of Communication with Friends and Family: More than three times a week   . Frequency of Social Gatherings with Friends and Family: More than three times a week   . Attends Religious Services: More than 4 times per year   . Active Member of Clubs or Organizations: Yes   . Attends Banker Meetings: More than 4 times per year   . Marital Status: Married   Vitals:   08/16/23 1340  BP: 134/80  Pulse: 73  SpO2: 98%   Body mass index is 22.15 kg/m.  Physical Exam Vitals and nursing note reviewed.  Constitutional:      General: She is not in acute distress.    Appearance: She is well-developed.  HENT:     Head: Normocephalic and atraumatic.     Mouth/Throat:     Mouth: Mucous membranes are moist.     Pharynx: Oropharynx is clear.  Eyes:     Conjunctiva/sclera: Conjunctivae normal.  Cardiovascular:     Rate and Rhythm: Normal rate and regular rhythm.     Pulses:          Dorsalis pedis pulses are 2+ on the right side and 2+ on the left side.     Heart sounds: No murmur heard. Pulmonary:     Effort: Pulmonary effort is normal. No respiratory distress.     Breath sounds: Normal breath sounds.  Abdominal:     Palpations: Abdomen is soft. There is no hepatomegaly or mass.     Tenderness: There is no abdominal tenderness.  Musculoskeletal:     Thoracic back: Tenderness (left side low thoracic tenderness) present.       Back:     Comments: ***  Lymphadenopathy:     Cervical: No cervical adenopathy.  Skin:    General:  Skin is warm.     Findings: No erythema or rash.  Neurological:     General: No focal deficit present.     Mental Status: She is alert and oriented to person, place, and time.     Cranial Nerves: No cranial nerve deficit.     Gait: Gait normal.  Psychiatric:     Comments: Well groomed, good eye  contact.   ASSESSMENT AND PLAN:  Ms. Vari was seen today for back pain.   There are no diagnoses linked to this encounter.  No follow-ups on file.  I, Fritz Jewel Wierda, acting as a scribe for Landis Dowdy [place], MD., have documented all relevant documentation on the behalf of Adler Chartrand [place], MD, as directed by  Seher Schlagel [place], MD while in the presence of Shyonna Carlin [place], MD.   I, Dovie Kapusta [place], MD, have reviewed all documentation for this visit. The documentation on 08/16/23 for the exam, diagnosis, procedures, and orders are all accurate and complete.  Josejulian Tarango G. [place], MD  [facility] [place] office.  Discharge Instructions   None

## 2023-09-27 ENCOUNTER — Other Ambulatory Visit (HOSPITAL_BASED_OUTPATIENT_CLINIC_OR_DEPARTMENT_OTHER): Payer: Self-pay

## 2023-09-27 ENCOUNTER — Encounter (HOSPITAL_BASED_OUTPATIENT_CLINIC_OR_DEPARTMENT_OTHER): Payer: Self-pay | Admitting: Family

## 2023-09-27 ENCOUNTER — Ambulatory Visit (HOSPITAL_BASED_OUTPATIENT_CLINIC_OR_DEPARTMENT_OTHER): Admitting: Family

## 2023-09-27 VITALS — BP 120/64 | HR 63 | Ht 62.0 in | Wt 122.0 lb

## 2023-09-27 DIAGNOSIS — E7800 Pure hypercholesterolemia, unspecified: Secondary | ICD-10-CM | POA: Diagnosis not present

## 2023-09-27 DIAGNOSIS — Q2381 Bicuspid aortic valve: Secondary | ICD-10-CM | POA: Diagnosis not present

## 2023-09-27 DIAGNOSIS — I10 Essential (primary) hypertension: Secondary | ICD-10-CM

## 2023-09-27 DIAGNOSIS — I7121 Aneurysm of the ascending aorta, without rupture: Secondary | ICD-10-CM

## 2023-09-27 DIAGNOSIS — I2510 Atherosclerotic heart disease of native coronary artery without angina pectoris: Secondary | ICD-10-CM

## 2023-09-27 DIAGNOSIS — E785 Hyperlipidemia, unspecified: Secondary | ICD-10-CM

## 2023-09-27 MED ORDER — SHINGRIX 50 MCG/0.5ML IM SUSR
0.5000 mL | Freq: Once | INTRAMUSCULAR | 0 refills | Status: AC
  Filled 2023-09-27: qty 0.5, 1d supply, fill #0

## 2023-09-27 MED ORDER — METOPROLOL SUCCINATE ER 50 MG PO TB24: 50.0000 mg | ORAL_TABLET | Freq: Every day | ORAL | 3 refills | Status: AC

## 2023-09-27 MED ORDER — LOSARTAN POTASSIUM 25 MG PO TABS: 25.0000 mg | ORAL_TABLET | Freq: Every day | ORAL | 3 refills | Status: AC

## 2023-09-27 MED ORDER — SIMVASTATIN 20 MG PO TABS: 20.0000 mg | ORAL_TABLET | Freq: Every day | ORAL | 3 refills | Status: AC

## 2023-09-27 NOTE — Progress Notes (Signed)
 Cardiology Office Note   Date:  09/27/2023  ID:  Clary, Boulais 09/15/1950, MRN [id number] PCP: [place], Betty G, MD  [facility] Providers Cardiologist:  Shelda Bruckner, MD     History of Present Illness  Discussed the use of AI scribe software for clinical note transcription with the patient, who gave verbal consent to proceed.  History of Present Illness Natina Galarde Capozzi is a 73 year old female with hypertension, TAA, bicuspid aortic valve with mild to moderate stenosis, nonobstructive CAD, HLD, neuroendocrine cancer who presents for a cardiovascular follow-up.  LHC 2009 with nonobstructive disease with widely patent coronaries except for 40-50% ostial left main. TAA measured 41 cm in 2017 previously followed by Dr. Fleeta Ochoa.  She experiences no chest pain, pressure, tightness, dyspnea, lightheadedness, dizziness, palpitations, or peripheral edema. Her medication regimen includes losartan , metoprolol , and a statin, which she follows consistently. She attempted to taper off metoprolol  but returned to her usual dose due to a decline in well-being.  Her physical activity involves practicing Tai Chi, caring for her grandchild, and running with a stroller, indicating significant physical exertion.  Follows a vegetarian diet.  She is aware of her right bundle branch block, previously identified, and has upcoming labs scheduled in September.    ROS: Please see the history of present illness.    All other systems reviewed and are negative.   Studies Reviewed      Cardiac Studies & Procedures   ______________________________________________________________________________________________   STRESS TESTS  MYOCARDIAL PERFUSION IMAGING 12/17/2016  Interpretation Summary  Nuclear stress EF: 65%.  Blood pressure demonstrated a hypertensive response to exercise.  Downsloping ST segment depression ST segment depression of 1 mm was noted during  stress in the II, III, aVF, V6, V5 and V4 leads.  The study is normal.  This is a low risk study.  Normal perfusion. LVEF 65% with normal wall motion. This is a low risk study.   ECHOCARDIOGRAM  ECHOCARDIOGRAM COMPLETE 06/30/2022  Narrative ECHOCARDIOGRAM REPORT    Patient Name:   TERREA BRUSTER Date of Exam: 06/30/2022 Medical Rec #:  [id number]                 Height:       62.0 in Accession #:    [id number]                Weight:       128.5 lb Date of Birth:  1951-01-23                 BSA:          1.584 m Patient Age:    71 years                  BP:           128/70 mmHg Patient Gender: F                         HR:           65 bpm. Exam Location:  Outpatient  Procedure: 2D Echo, 3D Echo, Cardiac Doppler, Color Doppler and Strain Analysis  Indications:    Bicuspid Aortic Valve  History:        Patient has prior history of Echocardiogram examinations, most recent 01/02/2020. CAD; Risk Factors:Non-Smoker, Hypertension and Dyslipidemia. Bicuspid Aortic Valve; Ascending Aortic Aneurysm.  Sonographer:    Orvil Holmes RDCS Referring Phys: [id number] BRIDGETTE CHRISTOPHER  IMPRESSIONS  1. Left ventricular ejection fraction, by estimation, is 55 to 60%. Left ventricular ejection fraction by 3D volume is 56 %. The left ventricle has normal function. The left ventricle has no regional wall motion abnormalities. Left ventricular diastolic parameters were normal. The average left ventricular global longitudinal strain is -16.8 %. The global longitudinal strain is normal. 2. Right ventricular systolic function is normal. The right ventricular size is normal. There is normal pulmonary artery systolic pressure. The estimated right ventricular systolic pressure is 24.3 mmHg. 3. The mitral valve is normal in structure. Mild to moderate mitral valve regurgitation. No evidence of mitral stenosis. 4. The aortic valve is bicuspid. There is mild calcification of the aortic  valve. There is mild thickening of the aortic valve. Aortic valve regurgitation is not visualized. Mild aortic valve stenosis. Aortic valve mean gradient measures 14.0 mmHg. Aortic valve Vmax measures 2.48 m/s. 5. The inferior vena cava is normal in size with greater than 50% respiratory variability, suggesting right atrial pressure of 3 mmHg.  Comparison(s): Prior images reviewed side by side. Prior 2021: AV mean gradient 14 mmHg, unchanged.  FINDINGS Left Ventricle: Left ventricular ejection fraction, by estimation, is 55 to 60%. Left ventricular ejection fraction by 3D volume is 56 %. The left ventricle has normal function. The left ventricle has no regional wall motion abnormalities. The average left ventricular global longitudinal strain is -16.8 %. The global longitudinal strain is normal. The left ventricular internal cavity size was normal in size. There is no left ventricular hypertrophy. Left ventricular diastolic parameters were normal.  Right Ventricle: The right ventricular size is normal. No increase in right ventricular wall thickness. Right ventricular systolic function is normal. There is normal pulmonary artery systolic pressure. The tricuspid regurgitant velocity is 2.31 m/s, and with an assumed right atrial pressure of 3 mmHg, the estimated right ventricular systolic pressure is 24.3 mmHg.  Left Atrium: Left atrial size was normal in size.  Right Atrium: Right atrial size was normal in size.  Pericardium: There is no evidence of pericardial effusion.  Mitral Valve: The mitral valve is normal in structure. Mild to moderate mitral valve regurgitation. No evidence of mitral valve stenosis.  Tricuspid Valve: The tricuspid valve is normal in structure. Tricuspid valve regurgitation is mild . No evidence of tricuspid stenosis.  Aortic Valve: The aortic valve is bicuspid. There is mild calcification of the aortic valve. There is mild thickening of the aortic valve. Aortic valve  regurgitation is not visualized. Mild aortic stenosis is present. Aortic valve mean gradient measures 14.0 mmHg. Aortic valve peak gradient measures 24.6 mmHg. Aortic valve area, by VTI measures 0.66 cm.  Pulmonic Valve: The pulmonic valve was normal in structure. Pulmonic valve regurgitation is not visualized. No evidence of pulmonic stenosis.  Aorta: The aortic root is normal in size and structure.  Venous: The inferior vena cava is normal in size with greater than 50% respiratory variability, suggesting right atrial pressure of 3 mmHg.  IAS/Shunts: No atrial level shunt detected by color flow Doppler.   LEFT VENTRICLE PLAX 2D LVIDd:         4.24 cm         Diastology LVIDs:         2.96 cm         LV e' medial:    5.98 cm/s LV PW:         1.08 cm         LV E/e' medial:  10.6 LV IVS:  1.01 cm         LV e' lateral:   6.53 cm/s LVOT diam:     1.80 cm         LV E/e' lateral: 9.7 LV SV:         40 LV SV Index:   26              2D LVOT Area:     2.54 cm        Longitudinal Strain 2D Strain GLS  -16.8 % Avg:  3D Volume EF LV 3D EF:    Left ventricul ar ejection fraction by 3D volume is 56 %.  3D Volume EF: 3D EF:        56 % LV EDV:       84 ml LV ESV:       37 ml LV SV:        47 ml  RIGHT VENTRICLE RV Basal diam:  3.08 cm RV Mid diam:    2.47 cm RV S prime:     9.14 cm/s TAPSE (M-mode): 2.3 cm  LEFT ATRIUM           Index        RIGHT ATRIUM           Index LA diam:      3.90 cm 2.46 cm/m   RA Area:     13.00 cm LA Vol (A2C): 35.1 ml 22.16 ml/m  RA Volume:   29.20 ml  18.43 ml/m LA Vol (A4C): 44.3 ml 27.97 ml/m AORTIC VALVE AV Area (Vmax):    0.63 cm AV Area (Vmean):   0.59 cm AV Area (VTI):     0.66 cm AV Vmax:           248.00 cm/s AV Vmean:          171.000 cm/s AV VTI:            0.610 m AV Peak Grad:      24.6 mmHg AV Mean Grad:      14.0 mmHg LVOT Vmax:         61.80 cm/s LVOT Vmean:        39.700 cm/s LVOT VTI:          0.159  m LVOT/AV VTI ratio: 0.26  AORTA Ao Root diam: 3.40 cm Ao Asc diam:  3.90 cm  MITRAL VALVE                  TRICUSPID VALVE MV Area (PHT): 2.87 cm       TR Peak grad:   21.3 mmHg MV Decel Time: 264 msec       TR Vmax:        231.00 cm/s MR Peak grad:    81.7 mmHg MR Vmax:         452.00 cm/s  SHUNTS MR PISA:         0.57 cm     Systemic VTI:  0.16 m MR PISA Eff ROA: 4 mm        Systemic Diam: 1.80 cm MR PISA Radius:  0.30 cm MV E velocity: 63.20 cm/s MV A velocity: 60.30 cm/s MV E/A ratio:  1.05  Oneil Parchment MD Electronically signed by Oneil Parchment MD Signature Date/Time: 06/30/2022/1:34:04 PM    Final          ______________________________________________________________________________________________      Risk Assessment/Calculations           Physical Exam  VS:  BP 120/64 (BP Location: Right Arm, Patient Position: Sitting, Cuff Size: Normal)   Pulse 63   Ht 5' 2 (1.575 m)   Wt 122 lb (55.3 kg)   SpO2 98%   BMI 22.31 kg/m        Wt Readings from Last 3 Encounters:  09/27/23 122 lb (55.3 kg)  08/16/23 121 lb 2 oz (54.9 kg)  05/17/23 122 lb 6 oz (55.5 kg)    GEN: Well nourished, well developed in no acute distress NECK: No JVD; No carotid bruits CARDIAC: RRR, no murmurs, rubs, gallops RESPIRATORY:  Clear to auscultation without rales, wheezing or rhonchi  ABDOMEN: Soft, non-tender, non-distended EXTREMITIES:  No edema; No deformity   ASSESSMENT AND PLAN  Assessment and Plan Assessment & Plan Aortic dilation Mild aortic dilation managed with metoprolol  to maintain aortic size and reduce dilation risk. - Continue metoprolol .  Hypertension Hypertension well-controlled with losartan  for blood pressure management and renal protection. - Continue losartan .  Hyperlipidemia Hyperlipidemia managed with statin for cholesterol reduction and plaque stabilization. - Continue statin therapy.  Right bundle branch block Asymptomatic right bundle branch  block requires no treatment. - Monitor, no specific treatment required.   Nonobstructive CAD / HLD, LDL goal <70 - Stable with no anginal symptoms. No indication for ischemic evaluation.  GDMT aspirin  81 mg daily, Toprol  50 mg daily, simvastatin  20 mg daily. Recommend aiming for 150 minutes of moderate intensity activity per week and following a heart healthy diet.    HTN - BP well controlled. Continue current antihypertensive regimen losartan  25 mg daily, Toprol  50 mg daily.  Refills provided.  Bicuspic AV with mild stenosis - Echo 06/2022 normal LVEF, mild to moderate MR, bicuspid AV with mild AS. Unchanged from prior 12/2018. Repeat echo ~06/2025 or sooner if symptoms warrant.  Thoracic aortic aneurysm -normal aorta size by echo 07/2022. Continue optimal BP control.  RBBB -stable by EKG.  Asymptomatic with no near syncope, syncope.  Monitor with periodic EKG.       Dispo: follow up in 1 year  Signed, Reche GORMAN Finder, NP

## 2023-09-27 NOTE — Patient Instructions (Signed)
 Medication Instructions:   Your physician recommends that you continue on your current medications as directed. Please refer to the Current Medication list given to you today.   *If you need a refill on your cardiac medications before your next appointment, please call your pharmacy*  Lab Work:  None ordered.  If you have labs (blood work) drawn today and your tests are completely normal, you will receive your results only by: MyChart Message (if you have MyChart) OR A paper copy in the mail If you have any lab test that is abnormal or we need to change your treatment, we will call you to review the results.  Testing/Procedures:  None ordered.  Follow-Up: At [facility], you and your health needs are our priority.  As part of our continuing mission to provide you with exceptional heart care, our providers are all part of one team.  This team includes your primary Cardiologist (physician) and Advanced Practice Providers or APPs (Physician Assistants and Nurse Practitioners) who all work together to provide you with the care you need, when you need it.  Your next appointment:   1 year(s)  Provider:   Sheryle Donning, MD, Slater Duncan, NP, or Neomi Banks, NP    We recommend signing up for the patient portal called "MyChart".  Sign up information is provided on this After Visit Summary.  MyChart is used to connect with patients for Virtual Visits (Telemedicine).  Patients are able to view lab/test results, encounter notes, upcoming appointments, etc.  Non-urgent messages can be sent to your provider as well.   To learn more about what you can do with MyChart, go to [email or web address].   Other Instructions  Your physician wants you to follow-up in: 1 year.  You will receive a reminder letter in the mail two months in advance. If you don't receive a letter, please call our office to schedule the follow-up appointment.

## 2023-09-30 ENCOUNTER — Ambulatory Visit
Admission: RE | Admit: 2023-09-30 | Discharge: 2023-09-30 | Disposition: A | Discharge status: Home / Self Care | Source: Ambulatory Visit | Attending: Family Medicine | Admitting: Family Medicine

## 2023-09-30 DIAGNOSIS — Z0000 Encounter for general adult medical examination without abnormal findings: Secondary | ICD-10-CM

## 2023-09-30 DIAGNOSIS — Z1231 Encounter for screening mammogram for malignant neoplasm of breast: Secondary | ICD-10-CM | POA: Diagnosis not present

## 2023-11-10 ENCOUNTER — Telehealth: Payer: Self-pay | Admitting: Oncology

## 2023-11-10 NOTE — Telephone Encounter (Signed)
 Needed to reschedule lab appt. New day and time confirmed with PT.

## 2023-11-22 ENCOUNTER — Inpatient Hospital Stay: Attending: Nurse Practitioner

## 2023-11-22 DIAGNOSIS — C7B01 Secondary carcinoid tumors of distant lymph nodes: Secondary | ICD-10-CM | POA: Diagnosis not present

## 2023-11-22 DIAGNOSIS — D509 Iron deficiency anemia, unspecified: Secondary | ICD-10-CM | POA: Insufficient documentation

## 2023-11-22 DIAGNOSIS — R17 Unspecified jaundice: Secondary | ICD-10-CM | POA: Diagnosis not present

## 2023-11-22 DIAGNOSIS — C7A098 Malignant carcinoid tumors of other sites: Secondary | ICD-10-CM | POA: Diagnosis not present

## 2023-11-22 DIAGNOSIS — I712 Thoracic aortic aneurysm, without rupture, unspecified: Secondary | ICD-10-CM | POA: Insufficient documentation

## 2023-11-22 DIAGNOSIS — C7A019 Malignant carcinoid tumor of the small intestine, unspecified portion: Secondary | ICD-10-CM

## 2023-11-22 DIAGNOSIS — Q2381 Bicuspid aortic valve: Secondary | ICD-10-CM | POA: Insufficient documentation

## 2023-11-23 ENCOUNTER — Other Ambulatory Visit: Payer: Self-pay

## 2023-11-23 ENCOUNTER — Other Ambulatory Visit: Payer: Medicare PPO

## 2023-11-24 LAB — CHROMOGRANIN A: Chromogranin A (ng/mL): 70.7 ng/mL (ref 0.0–101.8)

## 2023-11-30 ENCOUNTER — Inpatient Hospital Stay: Payer: Medicare PPO | Admitting: Oncology

## 2023-11-30 ENCOUNTER — Other Ambulatory Visit (HOSPITAL_BASED_OUTPATIENT_CLINIC_OR_DEPARTMENT_OTHER): Payer: Self-pay

## 2023-11-30 ENCOUNTER — Other Ambulatory Visit: Payer: Self-pay | Admitting: *Deleted

## 2023-11-30 VITALS — BP 124/70 | HR 72 | Temp 98.2°F | Resp 18 | Ht 62.0 in | Wt 123.2 lb

## 2023-11-30 DIAGNOSIS — Q2381 Bicuspid aortic valve: Secondary | ICD-10-CM | POA: Diagnosis not present

## 2023-11-30 DIAGNOSIS — C7A019 Malignant carcinoid tumor of the small intestine, unspecified portion: Secondary | ICD-10-CM

## 2023-11-30 DIAGNOSIS — R17 Unspecified jaundice: Secondary | ICD-10-CM | POA: Diagnosis not present

## 2023-11-30 DIAGNOSIS — D509 Iron deficiency anemia, unspecified: Secondary | ICD-10-CM | POA: Diagnosis not present

## 2023-11-30 DIAGNOSIS — I712 Thoracic aortic aneurysm, without rupture, unspecified: Secondary | ICD-10-CM | POA: Diagnosis not present

## 2023-11-30 DIAGNOSIS — C7A098 Malignant carcinoid tumors of other sites: Secondary | ICD-10-CM | POA: Diagnosis not present

## 2023-11-30 DIAGNOSIS — C7B01 Secondary carcinoid tumors of distant lymph nodes: Secondary | ICD-10-CM | POA: Diagnosis not present

## 2023-11-30 MED ORDER — COMIRNATY 30 MCG/0.3ML IM SUSY
0.3000 mL | PREFILLED_SYRINGE | Freq: Once | INTRAMUSCULAR | 0 refills | Status: AC
  Filled 2023-11-30: qty 0.3, 1d supply, fill #0

## 2023-11-30 MED ORDER — FLUZONE HIGH-DOSE 0.5 ML IM SUSY
0.5000 mL | PREFILLED_SYRINGE | Freq: Once | INTRAMUSCULAR | 0 refills | Status: AC
  Filled 2023-11-30: qty 0.5, 1d supply, fill #0

## 2023-11-30 NOTE — Progress Notes (Signed)
 Placed referral in EPIC for Dr. Alm in dermatology for forehead lesion and faxed referral to Dr. Kristie for GI referral for screening colonoscopy w/medical records and demographics.

## 2023-11-30 NOTE — Progress Notes (Signed)
  [facility] OFFICE PROGRESS NOTE   Diagnosis: Carcinoid tumor  INTERVAL HISTORY:   Debra [name] returns as scheduled.  She feels well.  She reports approximately 2 days of diarrhea occurring twice yearly.  She had frequent bowel movements in the a.m. during the month of August.  This has resolved.  No flushing, nausea, or abdominal pain.  Good appetite.  Objective:  Vital signs in last 24 hours:  Blood pressure 124/70, pulse 72, temperature 98.2 F (36.8 C), temperature source Temporal, resp. rate 18, height 5' 2 (1.575 m), weight 132 lb 3.2 oz (60 kg), SpO2 100%.   Lymphatics: No cervical, supraclavicular, axillary, or inguinal nodes Resp: Lungs clear bilaterally Cardio: Regular rate and rhythm, 2/6 systolic murmur GI: No mass, nontender, no megaly Vascular: No leg edema  Lab Results:  Lab Results  Component Value Date   WBC 6.9 03/30/2023   HGB 12.7 03/30/2023   HCT 39.7 03/30/2023   MCV 85.9 03/30/2023   PLT 215 03/30/2023   NEUTROABS 4.2 03/30/2023    CMP  Lab Results  Component Value Date   NA 139 03/30/2023   K 4.1 03/30/2023   CL 106 03/30/2023   CO2 26 03/30/2023   GLUCOSE 92 03/30/2023   BUN 14 03/30/2023   CREATININE 0.58 03/30/2023   CALCIUM 9.0 03/30/2023   PROT 6.3 (L) 03/30/2023   ALBUMIN 4.4 03/30/2023   AST 15 03/30/2023   ALT 8 03/30/2023   ALKPHOS 98 03/30/2023   BILITOT 2.3 (H) 03/30/2023   GFRNONAA >60 03/30/2023   GFRAA >60 11/29/2019     Medications: I have reviewed the patient's current medications.   Assessment/Plan: Carcinoid tumors involving the the ileum, status post a small bowel resection 05/14/2014 with 1.4 cm and 1.1 cm tumors noted,T4Nx The larger tumor showed focal involvement of the serosal surface, resection margins negative, lymphovascular invasion present Persistent abdominal pain and nausea, CT showed evidence of a distal small bowel obstruction 10/03/2014 with an octreotide scan 10/17/2014  confirming positive uptake in a mesenteric lymph node Small bowel resection 11/15/2014 confirmed multiple small bowel neuroendocrine tumors, grade 2, with metastatic tumor involving 4 lymph nodes 03/30/2023 Chromogranin A level-74  CTs 04/07/2023-no specific findings to suggest recurrent tumor or metastatic disease.  No signs of small bowel obstruction.  Focal short segment of small bowel at the level of the anastomosis is dilated, likely reflecting postsurgical change.   History of intermittent muscle pain and nausea/vomiting secondary to bowel obstruction from #1   3.   History of Microcytic anemia-secondary to iron deficiency, likely secondary to bleeding from the small bowel neuroendocrine tumors    4.   Thoracic Aortic aneurysm   5.   Bicuspid aortic valve  6.    Chronic mild elevation of bilirubin-question Gilbert's syndrome      Disposition: Debra [name] is in clinical remission from the carcinoid tumor.  The chromogranin a level is normal.  She would like to continue follow-up at the cancer center.  She will return for an office visit and chromogranin A in 1 year.  She is due for a screening colonoscopy.  We will make a referral to gastroenterology.  She would like to be referred to dermatology to evaluate forehead lesions. She continues follow-up with cardiology for the aortic aneurysm and bicuspid aortic valve.  Arley Hof, MD  11/30/2023  12:04 PM

## 2023-12-01 ENCOUNTER — Encounter: Payer: Self-pay | Admitting: *Deleted

## 2023-12-24 ENCOUNTER — Other Ambulatory Visit: Payer: Self-pay

## 2023-12-24 ENCOUNTER — Telehealth: Payer: Self-pay

## 2023-12-24 DIAGNOSIS — K6389 Other specified diseases of intestine: Secondary | ICD-10-CM

## 2023-12-24 NOTE — Telephone Encounter (Signed)
 A referral was made to Dr. Renaye Sous at [facility]. I followed up with the referral department and reviewed Dr. Nola notes. According to her documentation, the patient was previously seen in 2013 and attempted to return in 2023; however, the patient was not permitted to re-establish care at that time. A new referral has now been made to [facility].

## 2024-01-10 ENCOUNTER — Encounter

## 2024-01-11 ENCOUNTER — Ambulatory Visit: Admitting: Family Medicine

## 2024-02-08 ENCOUNTER — Other Ambulatory Visit (HOSPITAL_BASED_OUTPATIENT_CLINIC_OR_DEPARTMENT_OTHER): Payer: Self-pay

## 2024-02-08 MED ORDER — SHINGRIX 50 MCG/0.5ML IM SUSR
INTRAMUSCULAR | 0 refills | Status: AC
  Filled 2024-02-08: qty 1, 1d supply, fill #0

## 2024-03-20 ENCOUNTER — Ambulatory Visit: Payer: Self-pay

## 2024-03-20 NOTE — Telephone Encounter (Signed)
 FYI Only or Action Required?: FYI only for provider: appointment scheduled on 03/21/24.  Patient was last seen in primary care on 08/16/2023 by Jordan, Debra G, MD.  Called Nurse Triage reporting Neck Pain.  Symptoms began several months ago.  Interventions attempted: Rest, hydration, or home remedies.  Symptoms are: gradually worsening.  Triage Disposition: See PCP Within 2 Weeks  Patient/caregiver understands and will follow disposition?: Yes   Neck pain when turning to the left for several months. No pain at rest. Ranges from 1-8/10 with certain movements. No CP or SOB. No recent injury. No sore throat. No fever or headache. Scheduled appt with PCP on 1/13. Advised UC or ED for worsening symptoms.     Copied from CRM [id number]. Topic: Clinical - Red Word Triage >> Mar 20, 2024 10:56 AM Adelita E wrote: [facility] that prompted transfer to Nurse Triage: Recurring pain in neck when turning head. Reason for Disposition  Neck pain is a chronic symptom (recurrent or ongoing AND present > 4 weeks)  Answer Assessment - Initial Assessment Questions 1. ONSET: When did the pain begin?      Several months ago  2. LOCATION: Where does it hurt?      Left neck when turning to the left  3. PATTERN Does the pain come and go, or has it been constant since it started?      Fluctuates depending on movement  4. SEVERITY: How bad is the pain?  (Scale 0-10; or none or slight stiffness, mild, moderate, severe)     None at rest, ranges from 1-8/10 with certain movements.  5. RADIATION: Does the pain go anywhere else, shoot into your arms?     Denies  6. CORD SYMPTOMS: Any weakness or numbness of the arms or legs?     Denies  7. CAUSE: What do you think is causing the neck pain?     Possibly arthritis  8. NECK OVERUSE: Any recent activities that involved turning or twisting the neck?     Denies  9. OTHER SYMPTOMS: Do you have any other symptoms? (e.[name]., headache, fever,  chest pain, difficulty breathing, neck swelling)     Denies  Protocols used: Neck Pain or Stiffness-A-AH

## 2024-03-21 ENCOUNTER — Encounter: Payer: Self-pay | Admitting: Family Medicine

## 2024-03-21 ENCOUNTER — Ambulatory Visit

## 2024-03-21 ENCOUNTER — Ambulatory Visit: Admitting: Family Medicine

## 2024-03-21 VITALS — BP 130/88 | HR 65 | Temp 98.2°F | Resp 16 | Ht 62.0 in | Wt 125.2 lb

## 2024-03-21 DIAGNOSIS — Z1211 Encounter for screening for malignant neoplasm of colon: Secondary | ICD-10-CM

## 2024-03-21 DIAGNOSIS — C7A012 Malignant carcinoid tumor of the ileum: Secondary | ICD-10-CM

## 2024-03-21 DIAGNOSIS — M542 Cervicalgia: Secondary | ICD-10-CM

## 2024-03-21 MED ORDER — TIZANIDINE HCL 4 MG PO TABS: 4.0000 mg | ORAL_TABLET | Freq: Every day | ORAL | 0 refills | Status: AC

## 2024-03-21 MED ORDER — CELECOXIB 100 MG PO CAPS: 100.0000 mg | ORAL_CAPSULE | Freq: Two times a day (BID) | ORAL | 0 refills | Status: AC

## 2024-03-21 MED ORDER — TIZANIDINE HCL 4 MG PO TABS: 4.0000 mg | ORAL_TABLET | Freq: Four times a day (QID) | ORAL | 0 refills | Status: DC | PRN

## 2024-03-21 NOTE — Assessment & Plan Note (Addendum)
 Involvement of ileum, s/p small bowel resection in 05/2024. Follows with oncologist annually. She is due for colonoscopy, last one done in 2016, GI referral placed.

## 2024-03-21 NOTE — Progress Notes (Signed)
 "  ACUTE VISIT Chief Complaint  Patient presents with   Neck Pain    X 2-3 months - turning to the left - not getting any better - hurts worse on someday's    Discussed the use of AI scribe software for clinical note transcription with the patient, who gave verbal consent to proceed.  History of Present Illness Debra [name] is a 74 year old femalea PMHx significant for primary carcinoid tumor of small intestine, HTN, CAD, and HLD who presents with left-sided neck pain.  She has been experiencing left-sided neck pain for two to three months without any known injury or incident. The pain is intermittent, localized to the left side of the neck and is exacerbated by turning her head to the left. She describes the pain as variable, with intensity ranging from 2 to 3 out of 10 on good days to 7 to 8 out of 10 on bad days. There is no radiation to the arm, and no tingling or numbness. The pain does not disturb her sleep and is absent when her neck is in a neutral position.  No significant limitation of range of motion.  She has not been taking any medication specifically for the neck pain. Occasionally, she takes acetaminophen  for arthritis-related lower back pain, which she has had for years.  No cough, shortness of breath, or wheezing.  -Malignant carcinoid tumor of abdominal intestine, she follows with oncologist annually. She was reminded during her last visit that she was due for colonoscopy. Last abdomen/pelvic CT done on 04/08/2023:No specific findings to suggest recurrent tumor or metastatic disease.  Review of Systems  Constitutional:  Negative for activity change, appetite change, chills and fever.  HENT:  Negative for mouth sores, sore throat and trouble swallowing.   Gastrointestinal:  Negative for abdominal pain, nausea and vomiting.  Skin:  Negative for rash.  Neurological:  Negative for syncope, weakness and headaches.  See other pertinent positives and negatives in  HPI.  Medications Ordered Prior to Encounter[1]  Past Medical History:  Diagnosis Date   Aortic aneurysm    Arthritis    moderate in back (11/15/2014)   Bicuspid aortic valve    mild AS by echo 12/2016   Carcinoid tumor of small intestine, malignant (HCC) dx'd 05/2014   Colon cancer (HCC)    NEUROENDOCRINE    Coronary artery disease    nonobstructive   H/O echocardiogram    bicuspid aortic valve with moderate aortic valve sclerosis,mild PR.TR.MR and mildly dilated aorta by echo on 8.2012   Heart murmur    History of kidney stones    Hypertension    Iron deficiency anemia    can't tolerate the supplements right now (11/15/2014)   Mild mitral regurgitation    PONV (postoperative nausea and vomiting)    PREOP YEARS AGO WITH HYSTERECTOMY FELT  CREEPY PRIOR TO SURGERY    Thoracic aortic aneurysm    mild, 4.1 cm by recent CT followed by Dr Obadiah   Allergies[2]  Social History   Socioeconomic History   Marital status: Married    Spouse name: Not on file   Number of children: 2   Years of education: 17   Highest education level: Master's degree (e.g., MA, MS, MEng, MEd, MSW, MBA)  Occupational History   Not on file  Tobacco Use   Smoking status: Never   Smokeless tobacco: Never  Vaping Use   Vaping status: Never Used  Substance and Sexual Activity   Alcohol use: Yes  Alcohol/week: 4.0 standard drinks of alcohol    Types: 4 Glasses of wine per week    Comment: 5 GLASSES OF WINE PER WEEK    Drug use: No   Sexual activity: Yes  Other Topics Concern   Not on file  Social History Narrative   Married, spouse Kelsey Durflinger   Social Drivers of Health   Tobacco Use: Low Risk (03/21/2024)   Patient History    Smoking Tobacco Use: Never    Smokeless Tobacco Use: Never    Passive Exposure: Not on file  Financial Resource Strain: Patient Declined (11/04/2021)   Overall Financial Resource Strain (CARDIA)    Difficulty of Paying Living Expenses: Patient declined  Food  Insecurity: No Food Insecurity (08/05/2022)   Hunger Vital Sign    Worried About Running Out of Food in the Last Year: Never true    Ran Out of Food in the Last Year: Never true  Transportation Needs: No Transportation Needs (11/04/2021)   PRAPARE - Administrator, Civil Service (Medical): No    Lack of Transportation (Non-Medical): No  Physical Activity: Insufficiently Active (08/05/2022)   Exercise Vital Sign    Days of Exercise per Week: 3 days    Minutes of Exercise per Session: 30 min  Stress: No Stress Concern Present (08/05/2022)   [employer] of Occupational Health - Occupational Stress Questionnaire    Feeling of Stress : Not at all  Social Connections: Socially Integrated (08/05/2022)   Social Connection and Isolation Panel    Frequency of Communication with Friends and Family: More than three times a week    Frequency of Social Gatherings with Friends and Family: More than three times a week    Attends Religious Services: More than 4 times per year    Active Member of Clubs or Organizations: Yes    Attends Banker Meetings: More than 4 times per year    Marital Status: Married  Depression (PHQ2-9): Low Risk (11/30/2023)   Depression (PHQ2-9)    PHQ-2 Score: 0  Alcohol Screen: Low Risk (08/05/2022)   Alcohol Screen    Last Alcohol Screening Score (AUDIT): 4  Housing: Low Risk (08/05/2022)   Housing    Last Housing Risk Score: 0  Utilities: Not At Risk (08/05/2022)   AHC Utilities    Threatened with loss of utilities: No  Health Literacy: Not on file   Vitals:   03/21/24 1429  BP: 130/88  Pulse: 65  Resp: 16  Temp: 98.2 F (36.8 C)  SpO2: 98%   Body mass index is 22.9 kg/m.  Physical Exam Vitals and nursing note reviewed.  Constitutional:      General: She is not in acute distress.    Appearance: She is well-developed.  HENT:     Head: Normocephalic and atraumatic.     Mouth/Throat:     Mouth: Mucous membranes are moist.      Pharynx: Oropharynx is clear.  Eyes:     Conjunctiva/sclera: Conjunctivae normal.  Cardiovascular:     Rate and Rhythm: Normal rate and regular rhythm.     Heart sounds: Murmur (Soft SEM RUSB) heard.  Pulmonary:     Effort: Pulmonary effort is normal. No respiratory distress.     Breath sounds: Normal breath sounds.  Musculoskeletal:     Cervical back: Spasms present. No edema or erythema.     Comments: No significant limitation of ROM. Pain elicited on left side of neck with rotation to left side. No  masses appreciated.  Lymphadenopathy:     Cervical: No cervical adenopathy.  Skin:    General: Skin is warm.     Findings: No erythema or rash.  Neurological:     General: No focal deficit present.     Mental Status: She is alert and oriented to person, place, and time.     Gait: Gait normal.  Psychiatric:        Mood and Affect: Mood and affect normal.   ASSESSMENT AND PLAN:  Ms. Alisi Lupien was seen today for neck pain.  Diagnoses and all orders for this visit: Orders Placed This Encounter  Procedures   DG Cervical Spine Complete   Ambulatory referral to Physical Therapy   Ambulatory referral to Gastroenterology   Neck pain We discussed possible etiologies, history and examination are not suggestive of a serious process. Because pain has been persistent, I think it is appropriate to obtain cervical x-ray. She agrees with Celebrex  100 mg twice daily for 7 to 10 days and Zanaflex  4 mg 1/2 to 1 tablet at bedtime for 10 to 14 days. PT will be arranged. Further recommendation will be given according to imaging result.  Cervical X ray: I do not appreciate bone lesions, degenerative changes at different levels with osteophytes.  -     DG Cervical Spine Complete; Future -     Celecoxib ; Take 1 capsule (100 mg total) by mouth 2 (two) times daily for 10 days.  Dispense: 20 capsule; Refill: 0 -     Ambulatory referral to Physical Therapy -     tiZANidine  HCl; Take 1 tablet  (4 mg total) by mouth at bedtime for 14 days.  Dispense: 14 tablet; Refill: 0  Malignant carcinoid tumor of ileum [facility]) Assessment & Plan: Involvement of ileum, s/p small bowel resection in 05/2024. Follows with oncologist annually. She is due for colonoscopy, last one done in 2016, GI referral placed.  Colon cancer screening -     Ambulatory referral to Gastroenterology  Return if symptoms worsen or fail to improve.  Ladarrious Kirksey G. Salvatrice Morandi, MD  [facility]. [place] office.     [1]  Current Outpatient Medications on File Prior to Visit  Medication Sig Dispense Refill   aspirin  EC 81 MG tablet Take 81 mg by mouth daily. Swallow whole.     losartan  (COZAAR ) 25 MG tablet Take 1 tablet (25 mg total) by mouth daily. 90 tablet 3   metoprolol  succinate (TOPROL -XL) 50 MG 24 hr tablet Take 1 tablet (50 mg total) by mouth daily. Take with or immediately following a meal. 90 tablet 3   simvastatin  (ZOCOR ) 20 MG tablet Take 1 tablet (20 mg total) by mouth daily at 6 PM. 90 tablet 3   Zoster Vaccine Adjuvanted (SHINGRIX ) injection Inject into the muscle. (Patient not taking: Reported on 03/21/2024) 1 each 0   No current facility-administered medications on file prior to visit.  [2]  Allergies Allergen Reactions   Codeine Anaphylaxis and Shortness Of Breath   Opium Anaphylaxis   Oxycodone Shortness Of Breath   Penicillins Anaphylaxis and Shortness Of Breath   Azithromycin  Diarrhea    Pt request not to take    "

## 2024-03-21 NOTE — Patient Instructions (Addendum)
 A few things to remember from today's visit:  Neck pain - Plan: DG Cervical Spine Complete, celecoxib  (CELEBREX ) 100 MG capsule, Ambulatory referral to Physical Therapy, tiZANidine  (ZANAFLEX ) 4 MG tablet, DISCONTINUED: tiZANidine  (ZANAFLEX ) 4 MG tablet Zanaflex  at bedtime for 10-14 days, Celebrex  100 mg 2 times daily for 7-10 days. Topical icy hot may help.  If you need refills for medications you take chronically, please call your pharmacy. Do not use My Chart to request refills or for acute issues that need immediate attention. If you send a my chart message, it may take a few days to be addressed, specially if I am not in the office.  Please be sure medication list is accurate. If a new problem present, please set up appointment sooner than planned today.

## 2024-03-22 ENCOUNTER — Encounter: Payer: Self-pay | Admitting: Pediatrics

## 2024-03-29 ENCOUNTER — Ambulatory Visit: Payer: Self-pay | Admitting: Family Medicine

## 2024-03-29 ENCOUNTER — Telehealth: Payer: Self-pay | Admitting: Family Medicine

## 2024-03-29 NOTE — Telephone Encounter (Signed)
 Copied from CRM #[id number]. Topic: Referral - Status >> Mar 29, 2024  3:50 PM Berneda F wrote: Reason for CRM: Patient called to check on the status of her PT referral. I do see here it was sent over to the PT she requested ([facility]) on 03/21/24, but when she called them, they stated they had not yet received the referral. I attempted to call their number 3x and got their vm every time. I did leave a message with the referral coordinator to see if we need to resend this referral over for her.  Could we go ahead and send it again for the patient just to be on the safe side?  Please call patient back with any updates on this at [phone] (home)

## 2024-04-03 NOTE — Telephone Encounter (Signed)
 resent

## 2024-04-20 ENCOUNTER — Ambulatory Visit: Admitting: Pediatrics

## 2024-07-18 ENCOUNTER — Ambulatory Visit: Admitting: Dermatology

## 2024-11-22 ENCOUNTER — Other Ambulatory Visit

## 2024-11-29 ENCOUNTER — Ambulatory Visit: Admitting: Oncology
